# Patient Record
Sex: Female | Born: 1965 | Race: White | Hispanic: No | Marital: Single | Smoking: Former smoker
Health system: Southern US, Community
[De-identification: ages and names within clinical notes are randomized; demographics above are authoritative.]

## PROBLEM LIST (undated history)

## (undated) DIAGNOSIS — K259 Gastric ulcer, unspecified as acute or chronic, without hemorrhage or perforation: Secondary | ICD-10-CM

## (undated) DIAGNOSIS — I10 Essential (primary) hypertension: Secondary | ICD-10-CM

## (undated) DIAGNOSIS — J45909 Unspecified asthma, uncomplicated: Secondary | ICD-10-CM

## (undated) HISTORY — PX: CHOLECYSTECTOMY: SHX55

---

## 2007-08-05 ENCOUNTER — Other Ambulatory Visit: Payer: Self-pay

## 2007-08-05 ENCOUNTER — Emergency Department: Payer: Self-pay | Admitting: Emergency Medicine

## 2007-08-29 ENCOUNTER — Other Ambulatory Visit: Payer: Self-pay

## 2007-08-30 ENCOUNTER — Inpatient Hospital Stay: Payer: Self-pay | Admitting: Internal Medicine

## 2007-09-08 ENCOUNTER — Inpatient Hospital Stay: Payer: Self-pay | Admitting: *Deleted

## 2007-09-14 ENCOUNTER — Other Ambulatory Visit: Payer: Self-pay

## 2007-09-15 ENCOUNTER — Inpatient Hospital Stay: Payer: Self-pay | Admitting: Internal Medicine

## 2007-12-26 ENCOUNTER — Other Ambulatory Visit: Payer: Self-pay

## 2007-12-27 ENCOUNTER — Inpatient Hospital Stay: Payer: Self-pay | Admitting: Internal Medicine

## 2008-02-10 ENCOUNTER — Other Ambulatory Visit: Payer: Self-pay

## 2008-02-11 ENCOUNTER — Inpatient Hospital Stay: Payer: Self-pay | Admitting: Internal Medicine

## 2008-03-04 ENCOUNTER — Other Ambulatory Visit: Payer: Self-pay

## 2008-03-04 ENCOUNTER — Emergency Department: Payer: Self-pay | Admitting: Emergency Medicine

## 2008-03-05 ENCOUNTER — Inpatient Hospital Stay: Payer: Self-pay | Admitting: Internal Medicine

## 2008-05-23 ENCOUNTER — Ambulatory Visit: Payer: Self-pay | Admitting: Internal Medicine

## 2008-05-30 ENCOUNTER — Inpatient Hospital Stay: Payer: Self-pay | Admitting: Internal Medicine

## 2008-06-23 ENCOUNTER — Ambulatory Visit: Payer: Self-pay | Admitting: Internal Medicine

## 2008-07-05 ENCOUNTER — Emergency Department: Payer: Self-pay | Admitting: Emergency Medicine

## 2008-11-21 ENCOUNTER — Ambulatory Visit: Payer: Self-pay | Admitting: Internal Medicine

## 2008-12-09 ENCOUNTER — Inpatient Hospital Stay: Payer: Self-pay | Admitting: Specialist

## 2008-12-16 ENCOUNTER — Emergency Department: Payer: Self-pay | Admitting: Internal Medicine

## 2008-12-19 ENCOUNTER — Ambulatory Visit: Payer: Self-pay | Admitting: Internal Medicine

## 2008-12-21 ENCOUNTER — Ambulatory Visit: Payer: Self-pay | Admitting: Internal Medicine

## 2008-12-21 ENCOUNTER — Ambulatory Visit: Payer: Self-pay | Admitting: Gynecologic Oncology

## 2009-01-21 ENCOUNTER — Ambulatory Visit: Payer: Self-pay | Admitting: Internal Medicine

## 2009-01-21 ENCOUNTER — Ambulatory Visit: Payer: Self-pay | Admitting: Gynecologic Oncology

## 2009-02-21 ENCOUNTER — Ambulatory Visit: Payer: Self-pay | Admitting: Internal Medicine

## 2009-02-21 ENCOUNTER — Ambulatory Visit: Payer: Self-pay | Admitting: Gynecologic Oncology

## 2009-03-23 ENCOUNTER — Ambulatory Visit: Payer: Self-pay | Admitting: Gynecologic Oncology

## 2009-03-30 ENCOUNTER — Ambulatory Visit: Payer: Self-pay | Admitting: Internal Medicine

## 2009-04-23 ENCOUNTER — Ambulatory Visit: Payer: Self-pay | Admitting: Gynecologic Oncology

## 2009-04-23 ENCOUNTER — Ambulatory Visit: Payer: Self-pay | Admitting: Internal Medicine

## 2009-05-21 ENCOUNTER — Inpatient Hospital Stay: Payer: Self-pay | Admitting: *Deleted

## 2009-08-08 ENCOUNTER — Emergency Department: Payer: Self-pay

## 2009-09-15 ENCOUNTER — Emergency Department: Payer: Self-pay | Admitting: Emergency Medicine

## 2009-11-26 ENCOUNTER — Emergency Department: Payer: Self-pay | Admitting: Emergency Medicine

## 2010-01-15 ENCOUNTER — Inpatient Hospital Stay: Payer: Self-pay | Admitting: Internal Medicine

## 2010-02-07 ENCOUNTER — Emergency Department: Payer: Self-pay | Admitting: Emergency Medicine

## 2010-06-21 ENCOUNTER — Emergency Department: Payer: Self-pay | Admitting: Emergency Medicine

## 2010-09-09 ENCOUNTER — Emergency Department: Payer: Self-pay | Admitting: Emergency Medicine

## 2010-09-13 ENCOUNTER — Emergency Department: Payer: Self-pay | Admitting: Emergency Medicine

## 2010-12-18 ENCOUNTER — Emergency Department: Payer: Self-pay | Admitting: Emergency Medicine

## 2011-01-30 ENCOUNTER — Inpatient Hospital Stay: Payer: Self-pay | Admitting: Internal Medicine

## 2011-02-09 ENCOUNTER — Emergency Department: Payer: Self-pay | Admitting: *Deleted

## 2011-02-10 ENCOUNTER — Inpatient Hospital Stay: Payer: Self-pay | Admitting: *Deleted

## 2011-02-17 LAB — PATHOLOGY REPORT

## 2011-03-30 ENCOUNTER — Emergency Department: Payer: Self-pay | Admitting: Emergency Medicine

## 2012-02-23 ENCOUNTER — Emergency Department: Payer: Self-pay | Admitting: Internal Medicine

## 2012-02-23 LAB — COMPREHENSIVE METABOLIC PANEL
Albumin: 4.4 g/dL (ref 3.4–5.0)
Alkaline Phosphatase: 115 U/L (ref 50–136)
Anion Gap: 6 — ABNORMAL LOW (ref 7–16)
BUN: 14 mg/dL (ref 7–18)
Calcium, Total: 9.6 mg/dL (ref 8.5–10.1)
Co2: 28 mmol/L (ref 21–32)
EGFR (Non-African Amer.): >60
Glucose: 105 mg/dL — ABNORMAL HIGH (ref 65–99)
Osmolality: 284 (ref 275–301)
Potassium: 3.8 mmol/L (ref 3.5–5.1)
SGOT(AST): 19 U/L (ref 15–37)
SGPT (ALT): 20 U/L (ref 12–78)

## 2012-02-23 LAB — URINALYSIS, COMPLETE
Bilirubin,UR: NEGATIVE
Glucose,UR: NEGATIVE mg/dL (ref 0–75)
Ketone: NEGATIVE
Leukocyte Esterase: NEGATIVE
Nitrite: NEGATIVE
RBC,UR: 2 /HPF (ref 0–5)
Squamous Epithelial: 3
WBC UR: <1 /HPF (ref 0–5)

## 2012-02-23 LAB — CBC WITH DIFFERENTIAL/PLATELET
Basophil %: 0.7 %
Eosinophil #: 0.3 10*3/uL (ref 0.0–0.7)
Eosinophil %: 2.5 %
Lymphocyte %: 14.8 %
MCV: 87 fL (ref 80–100)
Monocyte #: 0.6 x10 3/mm (ref 0.2–0.9)
Neutrophil #: 10 10*3/uL — ABNORMAL HIGH (ref 1.4–6.5)
Neutrophil %: 77.3 %
RDW: 14.2 % (ref 11.5–14.5)
WBC: 12.9 10*3/uL — ABNORMAL HIGH (ref 3.6–11.0)

## 2012-02-23 LAB — CK TOTAL AND CKMB (NOT AT ARMC)
CK, Total: 80 U/L (ref 21–215)
CK-MB: 0.7 ng/mL (ref 0.5–3.6)

## 2012-02-23 LAB — TROPONIN I: Troponin-I: <0.02 ng/mL

## 2012-02-23 LAB — LIPASE, BLOOD: Lipase: 111 U/L (ref 73–393)

## 2012-02-24 ENCOUNTER — Inpatient Hospital Stay: Payer: Self-pay | Admitting: Internal Medicine

## 2012-02-24 LAB — HEPATIC FUNCTION PANEL A (ARMC)
Albumin: 4.3 g/dL (ref 3.4–5.0)
Alkaline Phosphatase: 114 U/L (ref 50–136)
Bilirubin,Total: 0.5 mg/dL (ref 0.2–1.0)
SGOT(AST): 18 U/L (ref 15–37)
SGPT (ALT): 20 U/L (ref 12–78)
Total Protein: 8.3 g/dL — ABNORMAL HIGH (ref 6.4–8.2)

## 2012-02-24 LAB — BASIC METABOLIC PANEL
Anion Gap: 9 (ref 7–16)
Calcium, Total: 9.7 mg/dL (ref 8.5–10.1)
Chloride: 105 mmol/L (ref 98–107)
Co2: 25 mmol/L (ref 21–32)
Creatinine: 0.68 mg/dL (ref 0.60–1.30)
EGFR (African American): >60
EGFR (Non-African Amer.): >60
Glucose: 119 mg/dL — ABNORMAL HIGH (ref 65–99)
Sodium: 139 mmol/L (ref 136–145)

## 2012-02-24 LAB — CBC WITH DIFFERENTIAL/PLATELET
Basophil #: 0.1 10*3/uL (ref 0.0–0.1)
Basophil %: 0.6 %
Eosinophil #: 0.1 10*3/uL (ref 0.0–0.7)
Eosinophil %: 0.7 %
Lymphocyte #: 2.2 10*3/uL (ref 1.0–3.6)
MCH: 29.7 pg (ref 26.0–34.0)
MCV: 87 fL (ref 80–100)
Monocyte #: 0.8 x10 3/mm (ref 0.2–0.9)
Platelet: 351 10*3/uL (ref 150–440)
RBC: 5.08 10*6/uL (ref 3.80–5.20)

## 2012-02-24 LAB — LIPASE, BLOOD: Lipase: 125 U/L (ref 73–393)

## 2012-02-25 LAB — CBC WITH DIFFERENTIAL/PLATELET
Basophil #: 0.2 10*3/uL — ABNORMAL HIGH (ref 0.0–0.1)
Eosinophil #: 0.2 10*3/uL (ref 0.0–0.7)
Eosinophil %: 1.6 %
HCT: 38.4 % (ref 35.0–47.0)
Lymphocyte #: 3.3 10*3/uL (ref 1.0–3.6)
Lymphocyte %: 28.1 %
MCHC: 33.4 g/dL (ref 32.0–36.0)
MCV: 87 fL (ref 80–100)
Monocyte #: 1.1 x10 3/mm — ABNORMAL HIGH (ref 0.2–0.9)
Neutrophil %: 59.7 %
Platelet: 296 10*3/uL (ref 150–440)
RBC: 4.4 10*6/uL (ref 3.80–5.20)
RDW: 14.2 % (ref 11.5–14.5)

## 2012-02-25 LAB — BASIC METABOLIC PANEL
Anion Gap: 7 (ref 7–16)
Calcium, Total: 8.7 mg/dL (ref 8.5–10.1)
EGFR (African American): >60
EGFR (Non-African Amer.): >60
Glucose: 90 mg/dL (ref 65–99)
Osmolality: 276 (ref 275–301)

## 2012-02-25 LAB — MAGNESIUM: Magnesium: 1.7 mg/dL — ABNORMAL LOW

## 2012-08-24 ENCOUNTER — Emergency Department: Payer: Self-pay | Admitting: Emergency Medicine

## 2012-08-24 LAB — URINALYSIS, COMPLETE
Glucose,UR: NEGATIVE mg/dL (ref 0–75)
Hyaline Cast: 78
Leukocyte Esterase: NEGATIVE
Nitrite: NEGATIVE
Ph: 5 (ref 4.5–8.0)
RBC,UR: 37 /HPF (ref 0–5)
Specific Gravity: 1.033 (ref 1.003–1.030)

## 2012-08-24 LAB — CBC
HCT: 45.6 % (ref 35.0–47.0)
HGB: 15.5 g/dL (ref 12.0–16.0)
MCH: 29.1 pg (ref 26.0–34.0)
MCHC: 33.9 g/dL (ref 32.0–36.0)
Platelet: 482 10*3/uL — ABNORMAL HIGH (ref 150–440)
RBC: 5.31 10*6/uL — ABNORMAL HIGH (ref 3.80–5.20)
RDW: 14 % (ref 11.5–14.5)

## 2012-08-24 LAB — COMPREHENSIVE METABOLIC PANEL
Albumin: 4.8 g/dL (ref 3.4–5.0)
Alkaline Phosphatase: 143 U/L — ABNORMAL HIGH (ref 50–136)
Anion Gap: 7 (ref 7–16)
BUN: 19 mg/dL — ABNORMAL HIGH (ref 7–18)
Chloride: 105 mmol/L (ref 98–107)
Co2: 29 mmol/L (ref 21–32)
Creatinine: 0.89 mg/dL (ref 0.60–1.30)
Osmolality: 287 (ref 275–301)
Potassium: 3.3 mmol/L — ABNORMAL LOW (ref 3.5–5.1)
Sodium: 141 mmol/L (ref 136–145)

## 2012-08-24 LAB — LIPASE, BLOOD: Lipase: 96 U/L (ref 73–393)

## 2012-08-25 ENCOUNTER — Observation Stay: Payer: Self-pay | Admitting: Family Medicine

## 2012-08-25 LAB — CBC
HCT: 43 % (ref 35.0–47.0)
MCH: 29.5 pg (ref 26.0–34.0)
MCHC: 34 g/dL (ref 32.0–36.0)
MCV: 87 fL (ref 80–100)
RBC: 4.95 10*6/uL (ref 3.80–5.20)
RDW: 14.2 % (ref 11.5–14.5)
WBC: 22 10*3/uL — ABNORMAL HIGH (ref 3.6–11.0)

## 2012-08-25 LAB — COMPREHENSIVE METABOLIC PANEL
Alkaline Phosphatase: 126 U/L (ref 50–136)
Anion Gap: 9 (ref 7–16)
BUN: 12 mg/dL (ref 7–18)
Chloride: 102 mmol/L (ref 98–107)
Co2: 27 mmol/L (ref 21–32)
Creatinine: 0.56 mg/dL — ABNORMAL LOW (ref 0.60–1.30)
EGFR (Non-African Amer.): >60
Glucose: 116 mg/dL — ABNORMAL HIGH (ref 65–99)
Potassium: 3 mmol/L — ABNORMAL LOW (ref 3.5–5.1)
SGOT(AST): 22 U/L (ref 15–37)
SGPT (ALT): 29 U/L (ref 12–78)
Sodium: 138 mmol/L (ref 136–145)
Total Protein: 8.6 g/dL — ABNORMAL HIGH (ref 6.4–8.2)

## 2012-08-25 LAB — URINALYSIS, COMPLETE
Bilirubin,UR: NEGATIVE
Ketone: NEGATIVE
Leukocyte Esterase: NEGATIVE
Nitrite: NEGATIVE
Ph: 8 (ref 4.5–8.0)
Protein: 30
RBC,UR: 5 /HPF (ref 0–5)
Specific Gravity: 1.015 (ref 1.003–1.030)
WBC UR: 2 /HPF (ref 0–5)

## 2012-08-25 LAB — TROPONIN I: Troponin-I: <0.02 ng/mL

## 2012-08-25 LAB — CLOSTRIDIUM DIFFICILE BY PCR

## 2012-08-26 LAB — BASIC METABOLIC PANEL
Anion Gap: 4 — ABNORMAL LOW (ref 7–16)
Calcium, Total: 8.8 mg/dL (ref 8.5–10.1)
Chloride: 110 mmol/L — ABNORMAL HIGH (ref 98–107)
Creatinine: 0.72 mg/dL (ref 0.60–1.30)
EGFR (African American): >60
Osmolality: 278 (ref 275–301)
Potassium: 3.8 mmol/L (ref 3.5–5.1)

## 2012-08-26 LAB — CBC WITH DIFFERENTIAL/PLATELET
Basophil %: 0.9 %
Eosinophil #: 0.3 10*3/uL (ref 0.0–0.7)
HCT: 39.2 % (ref 35.0–47.0)
HGB: 13.1 g/dL (ref 12.0–16.0)
MCH: 29.2 pg (ref 26.0–34.0)
MCHC: 33.5 g/dL (ref 32.0–36.0)
MCV: 87 fL (ref 80–100)
Monocyte #: 1.1 x10 3/mm — ABNORMAL HIGH (ref 0.2–0.9)
Monocyte %: 10.3 %
Neutrophil %: 47.4 %
Platelet: 313 10*3/uL (ref 150–440)
RBC: 4.5 10*6/uL (ref 3.80–5.20)
RDW: 14.2 % (ref 11.5–14.5)
WBC: 10.3 10*3/uL (ref 3.6–11.0)

## 2012-08-27 LAB — BASIC METABOLIC PANEL
Anion Gap: 4 — ABNORMAL LOW (ref 7–16)
BUN: 5 mg/dL — ABNORMAL LOW (ref 7–18)
Co2: 30 mmol/L (ref 21–32)
EGFR (African American): >60
EGFR (Non-African Amer.): >60
Potassium: 3.5 mmol/L (ref 3.5–5.1)

## 2013-04-07 ENCOUNTER — Emergency Department: Payer: Self-pay | Admitting: Emergency Medicine

## 2013-04-07 LAB — URINALYSIS, COMPLETE
Glucose,UR: NEGATIVE mg/dL (ref 0–75)
Hyaline Cast: 3
Ketone: NEGATIVE
Leukocyte Esterase: NEGATIVE
Nitrite: NEGATIVE
Protein: 100
Specific Gravity: 1.031 (ref 1.003–1.030)
WBC UR: 2 /HPF (ref 0–5)

## 2013-04-07 LAB — CBC
MCH: 29.8 pg (ref 26.0–34.0)
MCHC: 35 g/dL (ref 32.0–36.0)
Platelet: 458 10*3/uL — ABNORMAL HIGH (ref 150–440)
RBC: 5.76 10*6/uL — ABNORMAL HIGH (ref 3.80–5.20)
RDW: 14.2 % (ref 11.5–14.5)
WBC: 25.2 10*3/uL — ABNORMAL HIGH (ref 3.6–11.0)

## 2013-04-07 LAB — COMPREHENSIVE METABOLIC PANEL
Albumin: 4.6 g/dL (ref 3.4–5.0)
BUN: 21 mg/dL — ABNORMAL HIGH (ref 7–18)
Chloride: 104 mmol/L (ref 98–107)
Co2: 27 mmol/L (ref 21–32)
EGFR (African American): >60
Glucose: 146 mg/dL — ABNORMAL HIGH (ref 65–99)
Potassium: 4.4 mmol/L (ref 3.5–5.1)
Sodium: 135 mmol/L — ABNORMAL LOW (ref 136–145)

## 2013-04-07 LAB — LIPASE, BLOOD: Lipase: 157 U/L (ref 73–393)

## 2013-04-07 LAB — GC/CHLAMYDIA PROBE AMP

## 2013-10-22 ENCOUNTER — Observation Stay: Payer: Self-pay | Admitting: Internal Medicine

## 2013-10-22 LAB — BASIC METABOLIC PANEL
Anion Gap: 6 — ABNORMAL LOW (ref 7–16)
BUN: 19 mg/dL — ABNORMAL HIGH (ref 7–18)
Calcium, Total: 9.4 mg/dL (ref 8.5–10.1)
Chloride: 106 mmol/L (ref 98–107)
Co2: 24 mmol/L (ref 21–32)
Creatinine: 0.58 mg/dL — ABNORMAL LOW (ref 0.60–1.30)
EGFR (African American): >60
EGFR (Non-African Amer.): >60
Glucose: 101 mg/dL — ABNORMAL HIGH (ref 65–99)
Osmolality: 274 (ref 275–301)
POTASSIUM: 3.6 mmol/L (ref 3.5–5.1)
Sodium: 136 mmol/L (ref 136–145)

## 2013-10-22 LAB — CBC
HCT: 42.8 % (ref 35.0–47.0)
HGB: 14.9 g/dL (ref 12.0–16.0)
MCH: 29.7 pg (ref 26.0–34.0)
MCHC: 34.7 g/dL (ref 32.0–36.0)
MCV: 86 fL (ref 80–100)
PLATELETS: 353 10*3/uL (ref 150–440)
RBC: 5.01 10*6/uL (ref 3.80–5.20)
RDW: 14.6 % — AB (ref 11.5–14.5)
WBC: 13.9 10*3/uL — ABNORMAL HIGH (ref 3.6–11.0)

## 2013-10-22 LAB — URINALYSIS, COMPLETE
BILIRUBIN, UR: NEGATIVE
Bacteria: NONE SEEN
GLUCOSE, UR: NEGATIVE mg/dL (ref 0–75)
Ketone: NEGATIVE
Leukocyte Esterase: NEGATIVE
Nitrite: NEGATIVE
PROTEIN: NEGATIVE
Ph: 7 (ref 4.5–8.0)
RBC,UR: 8 /HPF (ref 0–5)
SPECIFIC GRAVITY: 1.013 (ref 1.003–1.030)
Squamous Epithelial: 1
WBC UR: <1 /HPF (ref 0–5)

## 2013-10-22 LAB — TROPONIN I

## 2013-10-22 LAB — D-DIMER(ARMC): D-Dimer: 395 ng/ml

## 2013-10-22 LAB — HEPATIC FUNCTION PANEL A (ARMC)
ALBUMIN: 4.2 g/dL (ref 3.4–5.0)
ALK PHOS: 105 U/L
AST: 25 U/L (ref 15–37)
BILIRUBIN DIRECT: 0.1 mg/dL (ref 0.00–0.20)
Bilirubin,Total: 0.8 mg/dL (ref 0.2–1.0)
SGPT (ALT): 34 U/L (ref 12–78)
TOTAL PROTEIN: 8.4 g/dL — AB (ref 6.4–8.2)

## 2013-10-22 LAB — LIPASE, BLOOD: LIPASE: 97 U/L (ref 73–393)

## 2013-10-22 LAB — HCG, QUANTITATIVE, PREGNANCY: Beta Hcg, Quant.: 1 m[IU]/mL

## 2013-10-24 LAB — WBC: WBC: 8 10*3/uL (ref 3.6–11.0)

## 2014-10-10 NOTE — Consult Note (Signed)
PATIENT NAME:  Lynn Larson, Lynn Larson MR#:  [id number] DATE OF BIRTH:  [birth year]  DATE OF CONSULTATION:  02/25/2012  REFERRING PHYSICIAN:  Delfino Lovett, MD  CONSULTING PHYSICIAN:  Lurline Del, MD  REFERRING PHYSICIAN: Nausea, vomiting, questionable hematemesis, and diarrhea.   HISTORY OF PRESENT ILLNESS: The patient is a 49 year old female. The patient has history of chronic gastritis and gastric erosions. She also has a history of nonsteroidal-induced peptic ulcer disease and COPD. The patient's last EGD was in 2012 with erosive gastritis and a short segment Barrett's esophagus was noted. The patient was admitted as according to her she has been having some pain high in the epigastric area. It started about a couple of days ago that was associated with some nausea, vomiting, and apparently she threw up a small amount of blood as well. According to the patient, she has been having diarrhea for the last two days as well but had a normal bowel movement today. She was supposed to be on PPI as outpatient but has been using mainly TUMS and Mylanta as outpatient.   PAST MEDICAL HISTORY:  1. History of erosive gastritis as seen on an upper GI endoscopy last year.  2. History of NSAID use in the past. 3. Chronic obstructive pulmonary disease.  4. 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 5. C. difficile colitis. 6. Hypertension.   SOCIAL HISTORY: She smokes about half a pack a day.   ALLERGIES: Morphine and Chantix.   MEDICATIONS AT HOME:  1. Zofran. 2. TUMS. 3. Mylanta.   REVIEW OF SYSTEMS: Grossly negative except for what is mentioned in the history of present illness.   PHYSICAL EXAMINATION:   GENERAL: Well built female, obese, does not appear to be in any acute distress, fully awake, alert, and oriented.   HEENT: Unremarkable.   NECK: Neck veins are flat.   LUNGS: Grossly clear to auscultation bilaterally with fair air entry and no added sounds.   CARDIOVASCULAR: Regular rate and  rhythm. No gallops or murmur.   ABDOMEN: Quite soft. Bowel sounds are positive. Nondistended. Mild epigastric tenderness was noted. There is no rebound or guarding.   NEUROLOGIC: Appears to be unremarkable.   VITAL SIGNS: Temperature 97.2, heart rate 66, blood pressure 113/71.   LABORATORY, DIAGNOSTIC, AND RADIOLOGICAL DATA: White cell count was 12.9 on admission, is now 11.6, hemoglobin 15.9, hematocrit 47.2 on admission. Current hemoglobin is 12.8 with a hematocrit of 38.4. Serum lipase is normal. Electrolytes are normal. Liver enzymes are unremarkable. Urinalysis is unremarkable.   CT scan of abdomen and pelvis showed minimal thickening of the wall of the stomach, colon, and small intestine which is probably secondary to poor distention as reported by the radiologist.   Three-way abdominal films are unremarkable.   ASSESSMENT AND PLAN: This is a patient with nausea, vomiting, questionable hematemesis and epigastric pain most likely secondary to persistent and recurrent erosive gastritis. She did have some diarrhea and, therefore, an acute gastroenteritis is another possibility. Her diarrhea has resolved. An upper GI endoscopy was performed a few minutes ago which showed severe erosive gastritis. I would recommend continuing her on PPI here as well as outpatient and this has been discussed with the patient that TUMS and Mylanta would not be enough to control her symptoms. The patient should follow-up with [facility] in about four weeks. Avoid non-steroidals. Full liquid diet and advance as tolerated.   If patient tolerates diet without any significant problem, anticipate discharge in the next 24 hours or so. Plan was  discussed with Dr. Delfino LovettVipul Shah.   ____________________________ Lurline DelShaukat Rishaan Gunner, MD si:drc D: 02/25/2012 12:06:36 ET T: 02/25/2012 12:46:58 ET JOB#: [id number]  cc: Lurline DelShaukat Krisa Blattner, MD, <Dictator> Lurline DelSHAUKAT Whittney Steenson MD ELECTRONICALLY SIGNED 03/03/2012 11:02

## 2014-10-10 NOTE — H&P (Signed)
PATIENT NAMAnder Larson:  Lynn Larson, Lynn Larson MR#:  [id number] DATE OF BIRTH:  24-Feb-1966  DATE OF ADMISSION:  02/24/2012  CHIEF COMPLAINT:  Abdominal pain, nausea, vomiting, and diarrhea for three days.   HISTORY OF PRESENT ILLNESS: 49 year old Caucasian female with a history of severe erosive gastritis and esophagitis, NSAID-induced peptic ulcer disease, and chronic obstructive pulmonary disease who presented to the ED with the above chief complaint. The patient is alert, awake, oriented. According to her, she developed abdominal pain with nausea, vomiting, and diarrhea for the past three days. The abdominal pain is in the epigastric area, constant, 6-10/10, stabbing, no radiation to the back, associated with nausea and vomiting. The patient said she has bloody vomitus. In addition, the patient has watery diarrhea 5 times, but denies any melena or bloody stool. The patient denies any NSAID use recently.   PAST MEDICAL HISTORY:  1. Severe erosive gastritis and esophagitis and possible duodenitis. 2. NSAID-induced peptic ulcer disease. 3. Chronic obstructive pulmonary disease. 4. Tobacco use.  5. Colitis. 6. Sphincter of Oddi dysfunction.  7. History of C. Difficile colitis in July 2011.  8. Hypertension.  SOCIAL HISTORY: The patient continues to smoke half pack a day. No alcohol. Denies any drug abuse.   FAMILY HISTORY: No CVA. No hypertension, diabetes, or coronary artery disease.   ALLERGIES:  Morphine, Chantix.  MEDICATIONS: Zofran 4 mg p.o. p.r.n.   REVIEW OF SYSTEMS: CONSTITUTIONAL: The patient denies any fever or chills. No headache or dizziness. Has weakness. EYES: No double vision or blurred vision. ENT: No postnasal drip, epistaxis, slurred speech, or dysphagia. CARDIOVASCULAR: No chest pain, palpitations, orthopnea, or nocturnal dyspnea. No leg edema. PULMONARY: No cough, sputum, shortness of breath, or hemoptysis. GI: Positive for abdominal pain, nausea, vomiting, and diarrhea. No melena or bloody  stool but has bloody vomitus. GU: No dysuria, hematuria, or incontinence. SKIN: No rash or jaundice. HEMATOLOGY: No easy bruising or bleeding. ENDOCRINE: No polyuria, polydipsia, or heat or cold intolerance. NEUROLOGIC: No syncope, loss of consciousness, or seizure. PSYCHIATRIC:  No depression, no anxiety, but has lot of stress from work.   PHYSICAL EXAMINATION:  VITALS: Blood pressure 160/67, pulse 66,  respirations 20, oxygen saturation 97% on room air.   GENERAL: The patient is alert, awake, and oriented. She is vomiting, in no acute distress.   HEENT: Pupils round, equal, reactive to light and accommodation. Moist oral mucosa. Clear oropharynx.   NECK: Supple. No JVD or carotid bruits. No lymphadenopathy. No thyromegaly. Moist oral mucosa. Clear oropharynx.   CARDIOVASCULAR: S1, S2, regular rate and rhythm. No murmurs or gallops.   PULMONARY: Bilateral air entry. No wheezing or rales. No use of accessory muscles to breathe.   ABDOMEN: Soft. Tenderness in the epigastric area. No rigidity. No rebound. Bowel sounds present. No organomegaly.   EXTREMITIES: No edema, clubbing, or cyanosis. No calf tenderness. Strong bilateral pedal pulses.   SKIN: No rash or jaundice.   NEUROLOGY: Alert and oriented times three. No focal deficits. Deep tendon reflexes 2+. Power five out of five. Sensation intact.   LABORATORY, DIAGNOSTIC, AND RADIOLOGICAL DATA: WBC 18.8, yesterday it was 12. Hemoglobin 15.1, platelets 351, glucose 119, BUN 15, creatinine 0.68. Electrolytes are normal. Lipase 125, bilirubin 0.5 total, direct 0.1. CT scan of the pelvis and abdomen with contrast yesterday showed mild wall thickening noted of the stomach, small bowel loops, and colon.  A few inflammatory changes cannot completely be excluded. Abdominal three-way x-ray showed nonobstructive bowel gas pattern. Chest radiograph without evidence of acute  cardiopulmonary disease. Urinalysis is negative.   IMPRESSION:  1. Possible  acute gastroenteritis.  2. GI bleeding. Need to rule out peptic ulcer disease.  3. Leukocytosis, possibly due to reaction.  4. Chronic obstructive pulmonary disease, stable.  5. Tobacco abuse.   PLAN OF TREATMENT:  1. The patient will be admitted to medical floor. We will keep n.p.o. except medications. We will start IV fluid support and get a GI consult.  2. We will start Zofran and Reglan IV p.r.n. Give Dilaudid p.r.n. and follow up CBC.  3. For uncontrolled hypertension we will start Norvasc.  4. Smoking cessation was counseled for five minutes.   Discussed the patient's situation and plan of treatment with the patient.   TIME SPENT: About 55 minutes.    ____________________________ Shaune Pollack, MD qc:bjt D: 02/24/2012 16:05:37 ET T: 02/24/2012 16:31:28 ET JOB#: [id number]  cc: Shaune Pollack, MD, <Dictator> Shaune Pollack MD ELECTRONICALLY SIGNED 02/25/2012 15:54

## 2014-10-10 NOTE — Consult Note (Signed)
Chief Complaint:   Subjective/Chief Complaint EGD showed erosive gastritis. Biopsies taken for H. pylori. Small hiatal hernia.  Continue PPI as OP. Full liquid diet, advance as tolerated. [facility].   Electronic Signatures: Lurline DelIftikhar, Angely Dietz (MD)  (Signed 04-Sep-13 11:54)  Authored: Chief Complaint   Last Updated: 04-Sep-13 11:54 by Lurline DelIftikhar, Yasin Ducat (MD)

## 2014-10-10 NOTE — Discharge Summary (Signed)
PATIENT NAMAnder Larson:  Larson, Lynn MR#:  [id number] DATE OF BIRTH:  1966-02-17  DATE OF ADMISSION:  02/24/2012 DATE OF DISCHARGE:  02/25/2012  DISCHARGE DIAGNOSES:  1. Possible acute gastroenteritis with nausea, vomiting, and abdominal pain, now improved with EGD showing erosive gastritis and small hiatal hernia. 2. Leukocytosis, possibly due to reaction. 3. Tobacco abuse. Counseled for about three minutes.   SECONDARY DIAGNOSES:  1. Severe erosive gastritis and esophagitis with possible duodenitis.  2. NSAID induced peptic ulcer disease.  3. Chronic obstructive pulmonary disease. 4. Colitis.  5. History of Clostridium difficile.  6. Hypertension.   CONSULTATION: GI, Dr. Niel HummerIftikhar   PROCEDURES/RADIOLOGY:  1. EGD by Dr. Niel HummerIftikhar on September 4th showed erosive gastritis and small hiatal hernia.  2. CT scan of the abdomen and pelvis with contrast on September 2nd showed mild wall thickening of the stomach, small bowel loops and colon.  3. Abdominal three-way with PA of chest on September 2nd showed nonobstructive bowel gas pattern. No acute cardiopulmonary disease.  4. Urinalysis on admission was negative.   HISTORY AND SHORT HOSPITAL COURSE: The patient is a 49 year old female with the above-mentioned medical problems who was admitted for possible gastroenteritis. She was having severe abdominal pain, nausea, and vomiting. She underwent CT scan of the abdomen and x-ray with results dictated above. GI consultation was obtained with Dr. Niel HummerIftikhar who recommended EGD which was performed with results dictated above essentially showing gastritis. He recommended starting on full liquid diet and advance as tolerated. The patient tolerated the diet well. She really wanted to go home so was discharged on September 4th in stable condition.   On the date of discharge her vital signs were as follows: Temperature 97.9, heart rate 84 per minute, respirations 20 per minute, blood pressure 113/71 mmHg. She was  saturating 95% on room air.   PERTINENT PHYSICAL EXAMINATION ON THE DATE OF DISCHARGE: CARDIOVASCULAR: S1, S2 normal. No murmurs, rubs, or gallop. LUNGS: Clear to auscultation bilaterally. No wheezing, rales, rhonchi, or crepitation. ABDOMEN: Soft, benign. NEUROLOGIC: Nonfocal examination. All other physical examination remained at baseline.   DISCHARGE MEDICATIONS:  1. Zofran 4 mg p.o. every eight hours as needed.  2. Ranitidine 300 mg p.o. b.i.d.   DISCHARGE DIET: Low sodium.   DISCHARGE ACTIVITY: As tolerated.   DISCHARGE INSTRUCTIONS AND FOLLOW-UP:  1. The patient was instructed to follow-up with her primary care physician at [facility] in 1 to 2 weeks.  2. She will need follow-up with Dr. Niel HummerIftikhar from GI in 2 to 4 weeks.   TOTAL TIME DISCHARGING THIS PATIENT: 55 minutes.   ____________________________ Ellamae SiaVipul S. Sherryll BurgerShah, MD vss:drc D: 02/25/2012 22:24:14 ET T: 02/27/2012 11:23:45 ET JOB#: [id number]  cc: Samayra Hebel S. Sherryll BurgerShah, MD, <Dictator> [facility] Lurline DelShaukat Iftikhar, MD Ellamae SiaVIPUL S [facility] MD ELECTRONICALLY SIGNED 02/27/2012 23:23

## 2014-10-10 NOTE — Consult Note (Signed)
Brief Consult Note: Diagnosis: Abdominal pain, vomiting and ? hematemesis.   Patient was seen by consultant.   Comments: ? PUD vs gastroentritis.  Recommendations: EGD today. Discussed with her and she is in full agreement. Further recommendations to follow.  Electronic Signatures: Lurline DelIftikhar, Jaymason Ledesma (MD)  (Signed 04-Sep-13 10:28)  Authored: Brief Consult Note   Last Updated: 04-Sep-13 10:28 by Lurline DelIftikhar, Tae Vonada (MD)

## 2014-10-13 NOTE — Consult Note (Signed)
PATIENT NAME:  Lynn Larson, Lynn Larson MR#:  [id number] DATE OF BIRTH:  1966/04/22  Dictated for Verdie Shire, MD  Martin:  08/26/2012  REFERRING PHYSICIAN:  Dr. Waldron Labs.  CONSULTING PHYSICIAN:  Corky Sox. Zettie Pho, PA-C  HOSPITALIST:  Dr. Laurin Coder.  REASON FOR CONSULTATION:  Nausea and vomiting, with a history of gastritis.   ATTENDING GASTROENTEROLOGY PHYSICIAN:  Dr. Verdie Shire.  HISTORY OF PRESENT ILLNESS: Lynn Larson is a pleasant 49 year old female seen in consultation at the request of Dr. Waldron Labs for evaluation of nausea, vomiting, and a history of gastritis. She initially presented to the Emergency Department with concerns of ongoing nausea and vomiting that seemed to be intractable, with accompanying epigastric abdominal pain. This had been going on for 2 to 3 days, and she did try to go home and avoid being admitted, however she stayed but she was unable to do so. She was therefore admitted for further evaluation and workup.  She does have a past medical history significant for erosive gastritis and esophagitis, seen on previous EGDs, most recently in September 2013. She denies NSAID use outside of Tylenol as needed, but she does admit that she does not stand PPI therapy at home as it is too expensive for her. She states she just takes Mylanta when she needs it. Currently, speaking with the patient she is tearful and states that her nausea and vomiting have improved since admission. She has not vomited since this morning; however, her epigastric abdominal pain has gotten quite severe. There is no significant dysphagia, necessarily. She does not feel the sensation of food getting stuck, but she does feel discomfort. She states her bowel movements have been slightly on the looser side, but she denies any bright-red blood per rectum or melena. She has noted subjective fevers; however, her temperature since being admitted has been within a normal range. She was  started on a PPI drip as well. She was incidentally found to have a UTI as well as a significant leukocytosis, with a current value of 22. There is currently no fever or chills. No chest pain or shortness of breath. Her biggest complaint currently is severe epigastric abdominal pain.  ALLERGIES: Morphine and Chantix.  HOME MEDICATIONS: Albuterol 2 puffs daily, promethazine 25 mg every 4 hours as needed for nausea.  PAST MEDICAL HISTORY: Significant for a history of esophagitis and gastritis, most recent EGD being September 2013, history of peptic ulcer disease, COPD, tobacco use, currently trying to quit and is on E-Cigarettes, sphincter of Oddi dysfunction, C. diff. colitis in 2011, hypertension.  SOCIAL HISTORY: The patient does state that she has a several-year history of tobacco use; however, she is currently quitting and is only using E-Cigarettes. She denies any alcohol or illicit drug use. She denies NSAID use. She takes Tylenol p.r.n. headaches.  FAMILY HISTORY: There is no known family history of GI malignancy or IBD.  REVIEW OF SYSTEMS: A 12-systems review of systems was obtained on the patient. All pertinent positives were mentioned above, and are otherwise negative.  OBJECTIVE: VITAL SIGNS: Blood pressure 148/87, heart rate 67, respirations 20, temperature 98 degrees, bedside pulse ox 93% on room air. GENERAL: This is a pleasant 49 year old female who does appear to be in obvious distress and is currently crying in the hospital room due to severe epigastric abdominal pain. Alert and oriented x 3.  HEAD: Atraumatic, normocephalic. NECK: Supple. No lymphadenopathy noted. EENT: Sclerae anicteric. Mucous membranes moist. The patient is crying. PULMONARY: Respirations are even  and nonlabored. Clear to auscultation bilaterally, anterior lung fields. CARDIAC: Regular rate and rhythm, S1, S2 noted. ABDOMEN: Is soft, nondistended. Positive tenderness to palpation is noted in the bilateral  upper quadrants without guarding or rebound. No signs of acute abdomen. Normoactive bowel sounds noted in all 4 quadrants. No organomegaly is palpated. No hernias appreciated. RECTAL EXAM: Deferred. EXTREMITIES: Negative for lower extremity edema; 2+ pulses noted, bilateral upper extremities. NEUROLOGIC: The patient is in a depressed mood; is quite tearful and uncomfortable. Alert and oriented x 3. Cranial nerves II-XII are grossly intact.  LABORATORY DATA:  White blood cells 22, hemoglobin 14.6, hematocrit 43, platelets 435. Sodium 138, potassium 3, BUN 12, creatinine 0.56, glucose 116, troponin is negative, lipase 91, bilirubin 0.6, alk phos 126, ALT 29, AST 22.  IMAGING: A CT of the abdomen and pelvis with contrast is currently pending.  ASSESSMENT AND PLAN:   ASSESSMENT:  1.  Severe epigastric abdominal pain. 2.  Nausea and vomiting, slowly starting to improve. 3.  History of esophagitis and gastritis. 4.  Leukocytosis: Unclear etiology. 5.  Urinary tract infection. 6.  Mild diarrhea, with a history of Clostridium difficile in 2011.  PLAN: I have discussed this patient's case in detail with Dr. Verdie Shire who is involved in the development of this patient's plan of care. At this time, because of the patient's history of gastritis and esophagitis with recent nausea and vomiting and current epigastric abdominal pain, we do agree with the patient being placed on a Protonix drip for GI prophylaxis. We do recommend continuing hydromorphone and Zofran as needed for symptomatic relief. Due to the severity of her symptoms and the patient currently being tearful due to the pain, we do agree with obtaining a CT of the abdomen and pelvis to evaluate. This is currently pending and is planned to be done later on tonight, 08/25/2012. We will await the findings of this scan before deciding to proceed with endoscopic intervention.   Also of note, the patient does have profound leukocytosis going up from 16 to  22, and should this persist or change in any way we would recommend considering blood cultures x2. Also, because the patient has a history of C. diff. and has had some looser stools recently, we would like to check a stool sample for a  C. diff. as well. We will continue to monitor this patient prior to hospitalization and make further recommendations pending above and per clinical course.  Thank you so much for this consultation and allowing [place] to participate in this patient's plan of care.       ____________________________ Corky Sox. Earle, PA-C kme:dm D: 08/26/2012 14:45:00 ET T: 08/26/2012 15:03:31 ET JOB#: [id number]  cc: Corky Sox. Earle, PA-C, <Dictator> [facility] PA ELECTRONICALLY SIGNED 08/30/2012 12:06

## 2014-10-13 NOTE — Discharge Summary (Signed)
PATIENT NAMAnder Gaster:  Larson, Lynn MR#:  [id number] DATE OF BIRTH:  11/18/1965  DATE OF ADMISSION:  08/25/2012 DATE OF DISCHARGE: 08/27/2012   Admission for observation.     DISCHARGE DISPOSITION:  Home.   MEDICATIONS AT DISCHARGE:  OxyContin 10/325 as needed pain, Protonix 40 mg twice daily, Phenergan as needed nausea and vomiting.   HOSPITAL COURSE:  The patient is a very nice 49 year old female, has history of acute onset abdominal pain with nausea and vomiting.  She had significant history of severe ulcerative gastritis and esophagitis in the past.  She also has COPD.  She has sphincter of Oddi dysfunction and a history of Clostridium difficile in the past.  She presents to the ER with abdominal pain, nausea, vomiting, having it for 2 to 3 days.  She was not able to keep up anything down and her white count was 22,000.  She was admitted for control of her symptoms.  GI was consulted.  GI did not have any different to treatments to add as far as procedures.  They evaluated the patient and decided that she needs to stay on her PPI as the patient was not taking it anymore.  The patient started improving very slowly.  Her pain was significant for what a CT scan of the abdomen was done during this hospitalization.  The CT scan of the abdomen did not show any acute abnormalities.  There was never any concerns about bleeding or any NSAID use.  No coffee-ground emesis for what the patient was not scoped.  She had hypokalemia and the potassium was repleted and it stayed up during this hospitalization.  She is able to be discharged in good condition.    LABORATORY DATA:  Important test results:  The patient had a UA, did have 16 white blood counts.  Repeated UA was only 2.  No symptoms of urine infection.  EKG, normal sinus rhythm.  Clostridium difficile was negative.  Urine culture was not showing any significant findings.  Her white count was 16,000 and 22,000, likely due to stress.  Her white count at discharge is  10.  Her electrolytes showed low potassium at 3.3 and then 3.  At discharge it is 3.5.  The patient is eating now for what she will be able to keep up with that.  Her calcium was 10.2 at March 4th, but now is 8.6.  That was likely due to dehydration.  Her LFTs were normal.   TIME SPENT:  I spent about 40 minutes with this discharge.      ____________________________ Lynn Furnaceoberto Sanchez Gutierrez, MD rsg:ea D: 08/27/2012 13:12:57 ET T: 08/27/2012 13:44:11 ET JOB#: [id number]  cc: Lynn Furnaceoberto Sanchez Gutierrez, MD, <Dictator> Dr. Bluford Kaufmannh Outside Physician  Lynn Vanderheyden Juanda ChanceSANCHEZ GUTIERRE MD ELECTRONICALLY SIGNED 09/07/2012 13:05

## 2014-10-13 NOTE — Consult Note (Signed)
Pt seen and examined. Please see Wilhelmenia BlaseKaryn Earle's notes. Pt with recurrent abd pain and N/V. Known hx of gastritis/duodenitis. Do not take PPI regularly due to excessive cost. Concerning that pain and WBC out of proportion for someone with just gastritis. Order stool for c.diff. Agree with PPI. Agree with CT of abd/pelvis. If CT fails to show obvious source or CT shows abnormal stomach, then will schedule EGD later. Will follow. Thanks.  Electronic Signatures: Lutricia Feilh, Paul (MD)  (Signed on 05-Mar-14 16:18)  Authored  Last Updated: 05-Mar-14 16:18 by Lutricia Feilh, Paul (MD)

## 2014-10-13 NOTE — Consult Note (Signed)
PATIENT NAME:  Lynn Larson, Lynn Larson MR#:  [id number] DATE OF BIRTH:  [birth year]  DATE OF CONSULTATION:  08/25/2012  REFERRING PHYSICIAN:  Dr. Randol KernElgergawy CONSULTING PHYSICIAN:  Hardie ShackletonKaryn M. Colin BentonEarle, PA-C  HOSPITALIST:  Dr. Mordecai MaesSanchez.    ATTENDING PHYSICIAN:  Dr. Lutricia FeilPaul Oh, gastroenterology  REASON FOR CONSULTATION:  Nausea and vomiting with a history of gastritis.    HISTORY OF PRESENT ILLNESS:  The patient is a pleasant 49 year old female seen in consultation at the request of Dr. Randol KernElgergawy for evaluation of nausea and vomiting with a history of gastritis.  She has been seen multiple times by our group in the past and most recently has undergone an endoscopy in September 2013, notable for gastritis.  She does admit that she does not take a PPI at home, as they are too expensive, but she does take Mylanta p.r.n.  She states that starting 24 to 48 hours ago, she began developing significant nausea and vomiting with associated epigastric abdominal pain and heartburn.  She went to the ER and tried to go home; however, this was intractable and she ended up coming back and getting admitted.  Upon further workup, laboratory data was noted to be having an abnormal white blood cell count, increasing from 16,000 to 22,000. She was also hypokalemic with a potassium level of 3.0.  Of note, her LFTs and lipase level were unremarkable.  Troponins were negative.  She was also found to have a UTI.  She describes this epigastric abdominal pain as being a tight pressure feeling, as though there is a knot there and there is also burning that goes up through the chest and into the back of her mouth.  She states she is still feeling quite nauseated, but the vomiting has improved since being started on a Protonix drip.  She cannot say for sure if there has been hematemesis as she has been eating red-colored popsicles.  She denies any changes in bowel habits outside of recent looser stools.  She does have a history of C. diff and a C. diff stool  culture is currently pending.  There is a history of subjective fevers; however, her temperature during this hospitalization thus far has been within normal limits.  No chest pain or shortness of breath.  No unintentional weight changes.    ALLERGIES:  MORPHINE AND CHANTIX.  PAST MEDICAL HISTORY:   1.  History of erosive gastritis and esophagitis, most recent EGD in September 2013.   2.  NSAID-induced peptic ulcer disease.  3.  COPD.  4.  History of tobacco use, currently has quit tobacco and is using e-cigarettes to wean herself off. 5.  History of C. diff colitis.   6.  Sphincter of Oddi dysfunction.   7.  Hypertension.   HOME MEDICATIONS: Albuterol inhaler 2 puffs daily and promethazine 25 mg 1 to 2 tablets q. 6 hours p.r.n.   SOCIAL HISTORY:  The patient has a history of tobacco use, but did quit recently and is now using e-cigarettes.  She denies any current tobacco, alcohol or illicit drug use.  She does take Tylenol p.r.n. and does try her best to stay away from ibuprofen or aspirin.    FAMILY HISTORY:  There is no known family history of GI malignancy or IBD.    REVIEW OF SYSTEMS:  A 12-system review of systems was obtained on the patient.  All pertinent positives are mentioned above and otherwise negative.    PHYSICAL EXAMINATION: VITAL SIGNS:  Blood pressure 148/87, heart rate 67,  respirations 20, temp 98 degrees, bedside pulse ox is 93% on room air.   GENERAL:  This is a pleasant 49 year old female who does appear to be in mild distress, is currently tearful, alert and oriented x 3.   HEAD:  Atraumatic and normocephalic.  NECK: Supple. No lymphadenopathy noted.   EENT:  Sclerae anicteric, mucous membranes moist. The patient is crying.   PULMONARY: Respirations are even and unlabored.  Good auscultation and bilateral entry of lungs fields.   CARDIAC: Regular rate and rhythm. S1, S2 noted.   ABDOMEN:  Soft, nondistended.  Mild tenderness to palpation is noted in the epigastric  region without guarding or rebound.  No signs of an acute abdomen.  No masses palpated.  No hernias appreciated.  Normoactive bowel sounds are noted in all 4 quadrants.   RECTAL:  Deferred.  EXTREMITIES:  Negative for lower extremity edema.  2+ pulses noted bilaterally.   NEUROLOGIC:  The patient is tearful, alert and oriented x 3.    LABORATORY DATA: White blood cells 22, hemoglobin 14.6, hematocrit 43, platelets 435, sodium 138, potassium 3, BUN 12, creatinine 0.56, glucose 116, bilirubin 0.6, alkaline phosphatase 126, ALT 29, AST 22, lipase 91.    Urinalysis is suggestive of a mild UTI.    Stool cultures, including C. diff are pending.    IMAGING DATA:  A CT of the abdomen and pelvis with contrast is currently pending.    ASSESSMENT:   1.  Nausea and vomiting, slowly starting to improve with Protonix drip.  2.  Severe epigastric abdominal pain.   3.  Leukocytosis. 4.  Hypokalemia.   5.  History of erosive gastritis, esophagitis and history of Clostridium difficile colitis.    PLAN: I have discussed this patient's case in detail with Dr. Lutricia Feil, who is involved in the development of the patient's plan of care.  At this time due to the patient's history of gastritis and esophagitis, we do agree with the patient being maintained on a Protonix drip for now and being given symptomatic management with pain control and anti-emetics as needed.  Due to the severity of her epigastric abdominal pain causing her to be tearful, we do agree with the patient undergoing a CT of the abdomen and pelvis with contrast, which the patient has already been scheduled for and is going down for it later on this evening.  Due to some recent looser stools and her history of Clostridium difficile, we will check a stool sample for this.  Also, in regards to her leukocytosis, should her pain persist and her white count remain this elevated, we would consider obtaining blood cultures x 2 to evaluate the source of infection.   She is mildly hypokalemic, which we do recommend electrolyte replacement, as this can help control her nausea and vomiting as well.  Pending the results of the CT scan and the progression of her symptoms through the night, we may or may not consider the patient for a repeat upper endoscopy at that time.  The above plan of care was discussed in detail with the patient, who verbalized understanding and is in agreement.  All questions were answered.    The above plan of care was discussed and agreed upon under a supervisory agreement between myself and Dr. Lutricia Feil.    Thank you so much for this consultation and allowing [place] to participate in the patient's plan of care.              ____________________________  Hardie Shackleton. Earle, PA-C kme:cc D: 08/25/2012 16:19:14 ET T: 08/25/2012 17:09:00 ET JOB#: [id number]  cc: Hardie Shackleton. Earle, PA-C, <Dictator> Hardie Shackleton EARLE PA ELECTRONICALLY SIGNED 08/30/2012 12:06

## 2014-10-13 NOTE — Consult Note (Signed)
Chief Complaint:  Subjective/Chief Complaint Feels better. Less abd pain and nausea. CT neg.   VITAL SIGNS/ANCILLARY NOTES: **Vital Signs.:   06-Mar-14 08:27  Vital Signs Type Routine  Temperature Temperature (F) 98.1  Celsius 36.7  Pulse Pulse 66  Respirations Respirations 20  Systolic BP Systolic BP 161  Diastolic BP (mmHg) Diastolic BP (mmHg) 096  Mean BP 116  Pulse Ox % Pulse Ox % 93  Pulse Ox Activity Level  At rest  Oxygen Delivery Room Air/ 21 %   Brief Assessment:  Cardiac Regular   Respiratory clear BS   Gastrointestinal epig tenderness   Lab Results: Routine Chem:  06-Mar-14 03:54   Glucose, Serum 98  BUN  5  Creatinine (comp) 0.72  Sodium, Serum 141  Potassium, Serum 3.8  Chloride, Serum  110  CO2, Serum 27  Calcium (Total), Serum 8.8  Anion Gap  4  Osmolality (calc) 278  eGFR (African American) >60  eGFR (Non-African American) >60 (eGFR values <80m/min/1.73 m2 may be an indication of chronic kidney disease (CKD). Calculated eGFR is useful in patients with stable renal function. The eGFR calculation will not be reliable in acutely ill patients when serum creatinine is changing rapidly. It is not useful in  patients on dialysis. The eGFR calculation may not be applicable to patients at the low and high extremes of body sizes, pregnant women, and vegetarians.)  Routine Hem:  06-Mar-14 03:54   WBC (CBC) 10.3  RBC (CBC) 4.50  Hemoglobin (CBC) 13.1  Hematocrit (CBC) 39.2  Platelet Count (CBC) 313  MCV 87  MCH 29.2  MCHC 33.5  RDW 14.2  Neutrophil % 47.4  Lymphocyte % 38.2  Monocyte % 10.3  Eosinophil % 3.2  Basophil % 0.9  Neutrophil # 4.9  Lymphocyte #  3.9  Monocyte #  1.1  Eosinophil # 0.3  Basophil # 0.1 (Result(s) reported on 26 Aug 2012 at 04:16AM.)   Assessment/Plan:  Assessment/Plan:  Assessment Abd pain. Likely recurrent gastritis. Feeling better. Discussed repeating EGD but likely to find similar findings as before. Pt not  anxious to repeat.THerefore, will not schedule EGD.   Plan Likely gastritis. Feeling better. Continue PPI and zofran. Advance diet as tolerated. Need to make sure that patient stays on daily PPI at home. Thanks   Electronic Signatures: OVerdie Shire(MD)  (Signed 06-Mar-14 12:18)  Authored: Chief Complaint, VITAL SIGNS/ANCILLARY NOTES, Brief Assessment, Lab Results, Assessment/Plan   Last Updated: 06-Mar-14 12:18 by OVerdie Shire(MD)

## 2014-10-13 NOTE — Consult Note (Signed)
Chief Complaint:  Subjective/Chief Complaint Continues to improve. No vomiting. Less abd pain. Wants to try solids. If tolerated, wants to go home.   VITAL SIGNS/ANCILLARY NOTES: **Vital Signs.:   07-Mar-14 05:07  Vital Signs Type Recheck  Systolic BP Systolic BP 409  Diastolic BP (mmHg) Diastolic BP (mmHg) 88  Mean BP 108  BP Source  if not from Vital Sign Device manual   Brief Assessment:  Cardiac Regular   Respiratory clear BS   Gastrointestinal mild epig tenderness   Lab Results: Routine Chem:  06-Mar-14 03:54   Glucose, Serum 98  BUN  5  Creatinine (comp) 0.72  Sodium, Serum 141  Potassium, Serum 3.8  Chloride, Serum  110  CO2, Serum 27  Calcium (Total), Serum 8.8  Anion Gap  4  Osmolality (calc) 278  eGFR (African American) >60  eGFR (Non-African American) >60 (eGFR values <32m/min/1.73 m2 may be an indication of chronic kidney disease (CKD). Calculated eGFR is useful in patients with stable renal function. The eGFR calculation will not be reliable in acutely ill patients when serum creatinine is changing rapidly. It is not useful in  patients on dialysis. The eGFR calculation may not be applicable to patients at the low and high extremes of body sizes, pregnant women, and vegetarians.)  07-Mar-14 08:50   Glucose, Serum 99  BUN  5  Creatinine (comp) 0.75  Sodium, Serum 140  Potassium, Serum 3.5  Chloride, Serum 106  CO2, Serum 30  Calcium (Total), Serum 8.6  Anion Gap  4  Osmolality (calc) 277  eGFR (African American) >60  eGFR (Non-African American) >60 (eGFR values <669mmin/1.73 m2 may be an indication of chronic kidney disease (CKD). Calculated eGFR is useful in patients with stable renal function. The eGFR calculation will not be reliable in acutely ill patients when serum creatinine is changing rapidly. It is not useful in  patients on dialysis. The eGFR calculation may not be applicable to patients at the low and high extremes of body sizes,  pregnant women, and vegetarians.)  Routine Hem:  06-Mar-14 03:54   WBC (CBC) 10.3  RBC (CBC) 4.50  Hemoglobin (CBC) 13.1  Hematocrit (CBC) 39.2  Platelet Count (CBC) 313  MCV 87  MCH 29.2  MCHC 33.5  RDW 14.2  Neutrophil % 47.4  Lymphocyte % 38.2  Monocyte % 10.3  Eosinophil % 3.2  Basophil % 0.9  Neutrophil # 4.9  Lymphocyte #  3.9  Monocyte #  1.1  Eosinophil # 0.3  Basophil # 0.1 (Result(s) reported on 26 Aug 2012 at 04:16AM.)   Assessment/Plan:  Assessment/Plan:  Assessment Likely recurrent gastritis.   Plan Try low fat, low chol diet. If tolerated, ok for discharge on PPI bid if possible. Thanks.   Electronic Signatures: OhVerdie ShireMD)  (Signed 07-Mar-14 10:44)  Authored: Chief Complaint, VITAL SIGNS/ANCILLARY NOTES, Brief Assessment, Lab Results, Assessment/Plan   Last Updated: 07-Mar-14 10:44 by OhVerdie ShireMD)

## 2014-10-13 NOTE — H&P (Signed)
PATIENT NAME:  Lynn Larson, Lynn Larson MR#:  [id number] DATE OF BIRTH:  19-Oct-1965  DATE OF ADMISSION:  08/25/2012  REFERRING PHYSICIAN: Rebecka Apley, MD   PRIMARY CARE PHYSICIAN: None, visits occasionally at [facility].   CHIEF COMPLAINT: Abdominal pain, nausea and vomiting.   HISTORY OF PRESENT ILLNESS: This is a 49 year old female with significant past medical history of severe erosive gastritis and esophagitis, chronic obstructive pulmonary disease, sphincter of Oddi dysfunction and history of C. difficile colitis, who presents with complaints of abdominal pain, nausea, vomiting. The patient reports she is having these symptoms going on for the last 2 to 3 days. Presented earlier today to ED with similar complaints, where she refused admission, where urinalysis was positive, given Cipro. Wanted to try to go home and see if her vomiting improved without admission. The patient continued to have vomiting at home, where she came back to the ED. The patient reports feeling feverish at home, but she was afebrile here. She had significant leukocytosis of 22,000; white count during her earlier visit to the ED today was 16,000. The patient denies any coffee-ground emesis, any bright red blood per rectum, any melena, any hematemesis, but as well complains of diarrhea x2 episodes today. Complains of some abdominal pain, mainly in the epigastric area. Reports she feels that these symptoms are typical for the last admissions, where she was found to have esophagitis/gastritis, where she feels pain in the epigastric area and some difficulty swallowing and some mild dysphagia. Repeat urinalysis does not show gross evidence of UTI, and the initial specimen did have significant epithelial cells, which may not have been clean catch. Due to the patient's significant nausea, vomiting and pain, hospitalist service was requested to admit the patient for further management of her symptoms. As well, the patient was found to  have potassium of 3.  PAST MEDICAL HISTORY: 1. Severe erosive gastritis and esophagitis and possible duodenitis.  2. NSAID-induced peptic ulcer disease.  3. Chronic obstructive pulmonary disease.  4. History of tobacco use, currently using E-cigarettes.   5. Colitis.  6. Sphincter of Oddi dysfunction.  7. History of C. difficile colitis in July 2011.  8. Hypertension.   SOCIAL HISTORY: The patient quit smoking recently, currently using E-cigarettes. No alcohol or drug abuse.   FAMILY HISTORY: No history of hypertension or diabetes, coronary artery disease.   ALLERGIES: MORPHINE AND CHANTIX.   HOME MEDICATIONS:  1. Albuterol 2 puffs daily.  2. Promethazine 25 one to two tablets every 4 to 6 hours as needed.   REVIEW OF SYSTEMS:  CONSTITUTIONAL: The patient denies any weight loss or weight gain. The patient complains of fever, generalized weakness and fatigue. Denies weight gain or weight loss.  EYES: Denies blurry vision, eye pain or discharge.  ENT: Denies sore throat, tinnitus, bloody nose, hearing loss, sinusitis.  RESPIRATORY: Denies cough, shortness of breath, hemoptysis, wheezing or pleurisy.  CARDIOVASCULAR: Denies chest pain, palpitations, edema, orthopnea or syncope.  GASTROINTESTINAL: Complains of nausea, vomiting, diarrhea, abdominal pain. Denies melena or hematemesis.  GENITOURINARY: Denies hematuria, dysuria, hesitancy or incontinence.  MUSCULOSKELETAL: Denies arthralgia, myalgia, joint weakness.  SKIN: Denies rash, pruritus or sores.  NEUROLOGICAL: Denies any ataxia, tremors, numbness, migraine.  ENDOCRINE: Denies polyuria, polydipsia, heat or cold intolerance.  PSYCHIATRIC: Denies depression, anxiety, alcohol abuse or drug abuse.  HEMATOLOGY: Denies easy bruising, bleeding diathesis or blood clots. ALLERGIES: Denies asthma, hayfever or hives.   PHYSICAL EXAMINATION:  VITAL SIGNS: Temperature 98.5, pulse 91, respiratory rate 18, blood pressure  160/67, saturating  92% on room air.  GENERAL: Well-nourished female lies in bed in mild distress due to pain.  HEENT: Head is atraumatic, normocephalic. Pupils equal and reactive to light. Pink conjunctivae. Anicteric sclerae. Moist oral mucosa.  NECK: Supple. No thyromegaly. No JVD.  CHEST: Good air entry bilaterally. No wheezing, rales, rhonchi.  CARDIOVASCULAR: S1, S2 heard. No rubs, murmurs or gallops.  ABDOMEN: Mild tenderness in the epigastric area. Bowel sounds present. No rebound, no guarding. No hepatosplenomegaly.  EXTREMITIES: No edema. No clubbing. No cyanosis.  PSYCHIATRIC: Appropriate affect. Awake, alert x3. Intact judgment and insight.  NEUROLOGIC: Cranial nerves grossly intact. Motor 5 out of 5. Sensation symmetrical.  SKIN: Normal skin turgor. Warm and dry.  LYMPHATICS: No cervical lymphadenopathy.  PERTINENT LABORATORY: Glucose 116, BUN 12, creatinine 0.56, sodium 138, potassium 3, chloride 102, CO2 27, ALT 29, AST 22, alkaline phosphatase 126. Troponin less than 0.02. White blood cells 22, hemoglobin 14.6, hematocrit 43, platelets 435. EKG showing normal sinus rhythm without significant ST or T wave changes. Urinalysis negative for leukocyte esterase and nitrite and 2 white blood cells.   ASSESSMENT AND PLAN:  1. Abdominal pain, nausea and vomiting. This is most likely related to gastritis/esophagitis as the patient is known to have history of severe erosive gastritis with esophagitis, with the current admission with similar presentation. Denies any recent NSAID use. No coffee-ground emesis, no melena. Will continue with hydration. Will keep the patient on clear liquid diet. Will start her on Protonix drip and on Maalox. Will have her on p.r.n. pain medications, Dilaudid, and p.r.n. Zofran and Phenergan. Will consult GI service as well. As well, the patient might be having some mild gastroenteritis as she is having diarrhea.  2. Leukocytosis. This is most likely related due to vomiting. The patient  had similar presentation in the past with significant leukocytosis with her nausea and vomiting. Will monitor closely. If no improvement after hydration and resolution of her vomiting, may need to obtain CT of abdomen. As well, given her history in the past of C. difficile and current diarrhea, will check C. difficile.  3. Hypokalemia. This is due to vomiting. Will replace. Will recheck level in a.m.  4. Chronic obstructive pulmonary disease. No wheezing. Will continue with albuterol.  5. Deep vein thrombosis prophylaxis. Subcutaneous heparin.  6. Gastrointestinal prophylaxis. The patient on Protonix drip   CODE STATUS: Full code.   TOTAL TIME SPENT ON ADMISSION AND PATIENT CARE: 55 minutes.   ____________________________ Starleen Armsawood S. Elgergawy, MD dse:OSi D: 08/25/2012 04:24:35 ET T: 08/25/2012 05:57:44 ET JOB#: [id number]  cc: Starleen Armsawood S. Elgergawy, MD, <Dictator> DAWOOD Teena IraniS ELGERGAWY MD ELECTRONICALLY SIGNED 09/02/2012 2:50

## 2014-10-14 NOTE — Discharge Summary (Signed)
PATIENT NAMEALECIA, DOI MR#:  [id number] DATE OF BIRTH:  Nov 11, 1965  DATE OF ADMISSION:  10/22/2013 DATE OF DISCHARGE:  10/24/2013  ADMITTING DIAGNOSIS: Chest pain due to severe reflux esophagitis.   DISCHARGE DIAGNOSES:   1. Chest pain due to acute esophagitis and gastroesophageal reflux disease.  2. Chronic obstructive pulmonary disease exacerbation.  3. History of hypertension.  4. Tobacco abuse. 5. Hyperlipidemia. 6. Severe reflux esophagitis.  DISCHARGE CONDITION: Stable.  DISCHARGE MEDICATIONS: The patient is to continue hydrochlorothiazide 25 mg once daily at bedtime, simvastatin unknown dose 1 tablet once at bedtime, sucralfate 1 gram every four hours, Zofran 4 mg every six hours as needed, acetaminophen hydrocodone 325/5 mg 1 tablet every four hours as needed. Combivent Respimat 100/20, 1 puff 4 times daily as needed, omeprazole 40 mg p.o. twice daily. NicoDerm transdermal film extended release 14 mg topically daily, tiotropium 18 mcg inhalation capsule once daily. The patient is not to take Celebrex.  HOME OXYGEN: None.   DIET: 2 grams salt, low-fat, low-cholesterol, mechanical soft, advance to regular consistency as tolerated.   FOLLOW-UP APPOINTMENT: With primary care physician with [facility] in two days after discharge, Dr. Adrian Blackwater cardiology tomorrow at 11:00 a.m. in his office for cardiac stress test.    CONSULTANTS: Care management, social work.   RADIOLOGIC STUDIES: Chest x-ray, portable single view, 10/22/2013, showed no active disease. CT scan of abdomen and pelvis with contrast 10/22/2013, revealed no acute findings. No findings to explain epigastric pain or vomiting status post cholecystectomy. Mild chronic intrahepatic and extrahepatic bile duct dilatation, small low density renal lesions, likely cysts.  Mild degenerative changes in the visualized spine.   The patient is a 49 year old Caucasian female with past medical history significant for a  history of reflux esophagitis who has not been taking her proton pump inhibitor, presented to the hospital with complaints of severe chest pain as well as intractable nausea and vomiting. Please refer to Dr. Melina Schools admission note on 10/22/2013. On arrival to the hospital, the patient's initial blood pressure was 146/83, pulse oximetry was 98% on room air, respiration rate was 20, temperature was 98.4 and pulse was in 90s. Her physical exam revealed mild tenderness in epigastric region. Otherwise, physical exam was unremarkable. EKG showed sinus rhythm with short PR. Chest x-ray was unremarkable. CT of abdomen and pelvis did not show any significant changes except of mild extrahepatic and intrahepatic bile duct dilatation and normal other studies. The patient's lab data done on admission revealed mild elevation of BUN to 19, glucose of 101, otherwise BMP was unremarkable. The patient's hCG beta subunit was 1. Liver enzymes: Mild elevation of total protein of 8.4, otherwise liver enzymes were normal. Cardiac enzymes x 2 were within normal limits. White blood cell count was initially mildly elevated to 13.9, hemoglobin was 14.9, platelet count 253. D-dimer was normal at 395. Urinalysis was remarkable for eight red blood cells, less than 1 white blood cells, 2+ blood, negative for protein, nitrites or leukocyte esterase. EKG showed poor data quality, interpretation may be adversely affected. Sinus rhythm with short PR at 95 beats per minute, ST abnormality, possible digitalis effect.  No acute ST-T changes were noted. The patient was admitted to the hospital for further evaluation. She was initiated on Carafate around the clock, as well as PPI. With this, her condition significantly improved. Over a period of time she felt much more comfortable. She was able to eat and drink fluids, was not in any significant discomfort. She  was also somewhat rehydrated with IV fluids. On the day of discharge 10/24/2013 she was ready  to go home. She was advised to advance her diet slowly and continue her medications. She is to follow up with [facility] for further recommendations. Meanwhile, she is to continue sucralfate as well as omeprazole. Since she was having some chest pains, we felt that the patient would benefit also from stress test as outpatient. I discussed her case over the weekend with Dr. Adrian BlackwaterShaukat Khan, who recommended for the patient to follow up in his office, tomorrow, 10/25/2013 for possible setting up a stress test as outpatient.   In regards to COPD, the patient was relatively comfortable on the day of admission; however, the next day, she developed some wheezing and cough. She was felt to have mild COPD exacerbation. She was initiated on DuoNebs; however, was not able to tolerate much of those.  She, however, was able to tolerate Combivent Respimat and she was recommended  to continue that medication. With conservative therapy her condition improved.   For history of hypertension, tobacco abuse. She was counseled about tobacco and nicotine replacement therapy was initiated. For hypertension, the patient is to continue her outpatient medications.   For hyperlipidemia, the patient is to continue simvastatin.   The patient is being discharged in stable condition with the above-mentioned medications and followup. Her vital signs on the day of discharge, temperature was 97.6, pulse was 63, respiration rate was 18, blood pressure 148/81. Saturation was 99% on room air at rest.   TIME SPENT: 40 minutes on this patient.   ____________________________ Katharina Caperima Jakim Drapeau, MD rv:sg D: 10/24/2013 19:07:39 ET T: 10/25/2013 09:03:34 ET JOB#: [id number]  cc: Katharina Caperima Karrah Mangini, MD, <Dictator> [facility]   Charniece Venturino MD ELECTRONICALLY SIGNED 11/10/2013 10:14

## 2014-10-14 NOTE — H&P (Signed)
PATIENT NAMAnder Larson:  Lynn Larson, Lynn Larson MR#:  [id number] DATE OF BIRTH:  [birth year]  DATE OF ADMISSION:  10/22/2013  CHIEF COMPLAINT: Severe chest pain and intractable nausea and vomiting.   HISTORY OF PRESENT ILLNESS: Ms. Lynn Larson is a 49 year old white female with a history of severe esophagitis with prior admissions for uncontrolled nausea and vomiting with chest pain who has not been taking a PPI for some time and developed nausea last evening, which resolved with sucralfate but then developed intractable nausea and vomiting while at work this afternoon. The patient states that she has not been taking a PPI for some time but has been taking over-the-counter ranitidine 150 mg daily. She does not follow any particular diet to avoid reflux. She has been recently transferred her medical care from 1 [facility] to another and has been without medications for a while. Last evening, she woke up at 2:30 in the morning with a cold sweat and elevated blood pressure accompanied by nausea. She took a sublingual Zofran x 2 and went back to sleep. When she went to work today she developed sudden onset of flushing, nausea and burning esophageal pain and started vomiting at work. In the Emergency Room, she has been given several doses of Dilaudid for pain along with Zofran and Ativan all of which have provided only transient relief. Her symptoms were aggravated by having to drink the contrast for the CT of the abdomen and pelvis which ultimately turned out to be normal. She is being admitted for management of severe esophagitis. The patient's primary care doctor is unknown. She has transferred recently from the [facility] to [facility]. 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 She also has a history of COPD from ongoing tobacco abuse, history of Clostridium difficile colitis in July 2011 and history of hypertension.   MEDICATIONS: Are: Hydrochlorothiazide and simvastatin, 25 mg for  the hydrochlorothiazide and the simvastatin is 20 mg daily.   PAST SURGERIES: Include cholecystectomy.   ALLERGIES: MORPHINE AND CHANTIX.    LAST HOSPITALIZATION: At [facility] in March 2014 for reflux esophagitis, severe.   PERTINENT FAMILY HISTORY: Notable for an absence of coronary artery disease, diabetes, or hypertension.   SOCIAL HISTORY: She is a current half a pack a day smoker. She does not drink alcohol. She works [employer].   REVIEW OF SYSTEMS: Negative for fever, fatigue, weakness, and weight loss. No recent changes in vision. No seasonal rhinitis, epistaxis, or sinus pain. No cough, wheezing, or hemoptysis. She denies dyspnea. She is currently endorsing chest pain secondary to severe esophagitis. She denies orthopnea, dyspnea on exertion, and syncope. She has been having nausea and vomiting and is continuing to do so here in the Emergency Room. No hematemesis, melena, or ulcers. Positive history of GERD per HPI. She denies dysuria, hematuria, and frequency. She denies polyuria and nocturia. She does have increased sweating at night. She denied any neck, back, shoulder, knee, or hip pain. She denies any numbness, weakness, or dysarthria. No history of seizures or memory loss. She denies any chronic anxiety or insomnia   PHYSICAL EXAMINATION:  VITAL SIGNS: Initial blood pressure was 146/83, repeat 121/75, pulse is in the mid 90s initially and now 66, rhythm is regular, temperature 98.4, respirations are 20, sating 98% on room air.  HEENT: Pupils are equal, round, and reactive to light. Extraocular movements are intact. Sclerae are not icteric. Mucosa is dry.  NECK: Supple without lymphadenopathy, JVD, thyromegaly, or carotid bruits.  LUNGS:  Clear to auscultation bilaterally with occasional rhonchi and wheezing.  CARDIOVASCULAR: Regular rate and rhythm. No murmurs, rubs, or gallops. She has good pedal pulses and no lower extremity edema. Chest wall is nontender.  ABDOMEN: Soft, tender without  guarding in the mid epigastric region. Bowel sounds are normal.  MUSCULOSKELETAL: 5/5 strength.  SKIN: Skin is warm and dry without rashes or lesions.  LYMPH: There is no cervical, axillary,  inguinal, or supraclavicular lymphadenopathy.  NEUROLOGICAL: Grossly nonfocal. She is alert and oriented and in mild distress secondary to pain.   ADMISSION LABORATORY DATA: Sodium 136, potassium 3.6, chloride 106, bicarbonate 24, BUN 19, creatinine 0.5, and glucose 101. White count is 13.9, hemoglobin is 14.9. Platelets are 353,000. AST is 25, ALT is 34, alkaline phosphatase is 105 and lipase is 97. Urine is positive for 8 red blood cells. No white cells or leukocyte esterase or nitrates. Troponin is normal at less than 0.02. D-dimer is normal at 395. UPT is negative.   Her EKG shows sinus rhythm with short PR. Chest x-ray is normal. CT of the abdomen and pelvis with IV and oral contrast shows mild extra and intrahepatic bile duct dilation, normal liver, pancreas, and spleen. No adrenal masses. Positive possible renal cysts. Bowels appear normal.   ASSESSMENT AND PLAN:  1. Chest pain secondary to severe reflux esophagitis secondary to noncompliance with proton pump inhibitor therapy. Protonix IV b.i.d. he has been ordered. The patient received Pepcid and Zofran in the Emergency Room and is still quite symptomatic. I have also ordered hyoscyamine sublingual tablets for the esophageal spasm and sucralfate as this has helped her in the past. The patient will need significant the patient education to understand their proton pump inhibitors and 2-blockers are not at all the same medications and given her extreme disease, she will need to continue a prescription-strength proton pump inhibitor going forward.  2. Chronic obstructive pulmonary disease: The patient is currently in having mild expiratory wheezes. I would not want to use of prednisone on her at this time. Will give albuterol and nebulizers as needed every 6  hours.  3. Tobacco abuse. Counseling on tobacco cessation was given today amounting to 3 minutes by me. She is interested in quitting. She has a prior adverse reaction to Chantix. Nicoderm patch ordered.  4. Hypertension. Given her nausea and vomiting, will suspend hydrochlorothiazide currently and consider adding an ACE inhibitor.   ESTIMATED TIME OF CARE: 60 minutes.     ____________________________ Duncan Dull, MD tt:lt D: 10/22/2013 19:19:48 ET T: 10/22/2013 21:15:47 ET JOB#: [id number]  cc: Duncan Dull, MD, <Dictator> Duncan Dull MD ELECTRONICALLY SIGNED 10/25/2013 7:52

## 2015-01-23 ENCOUNTER — Other Ambulatory Visit: Payer: Self-pay

## 2015-01-23 ENCOUNTER — Emergency Department
Admission: EM | Admit: 2015-01-23 | Discharge: 2015-01-23 | Disposition: A | Discharge status: Home / Self Care | Payer: Self-pay | Attending: Emergency Medicine | Admitting: Emergency Medicine

## 2015-01-23 ENCOUNTER — Encounter: Payer: Self-pay | Admitting: Emergency Medicine

## 2015-01-23 DIAGNOSIS — R197 Diarrhea, unspecified: Secondary | ICD-10-CM | POA: Insufficient documentation

## 2015-01-23 DIAGNOSIS — R51 Headache: Secondary | ICD-10-CM | POA: Insufficient documentation

## 2015-01-23 DIAGNOSIS — I10 Essential (primary) hypertension: Secondary | ICD-10-CM | POA: Insufficient documentation

## 2015-01-23 DIAGNOSIS — R1013 Epigastric pain: Secondary | ICD-10-CM | POA: Insufficient documentation

## 2015-01-23 DIAGNOSIS — Z720 Tobacco use: Secondary | ICD-10-CM | POA: Insufficient documentation

## 2015-01-23 DIAGNOSIS — R112 Nausea with vomiting, unspecified: Secondary | ICD-10-CM | POA: Insufficient documentation

## 2015-01-23 DIAGNOSIS — R1110 Vomiting, unspecified: Secondary | ICD-10-CM

## 2015-01-23 HISTORY — DX: Essential (primary) hypertension: I10

## 2015-01-23 LAB — CBC
HCT: 49.5 % — ABNORMAL HIGH (ref 35.0–47.0)
Hemoglobin: 16.5 g/dL — ABNORMAL HIGH (ref 12.0–16.0)
MCH: 28.9 pg (ref 26.0–34.0)
MCHC: 33.4 g/dL (ref 32.0–36.0)
MCV: 86.5 fL (ref 80.0–100.0)
PLATELETS: 371 10*3/uL (ref 150–440)
RBC: 5.72 MIL/uL — AB (ref 3.80–5.20)
RDW: 14.8 % — AB (ref 11.5–14.5)
WBC: 16.1 10*3/uL — AB (ref 3.6–11.0)

## 2015-01-23 LAB — COMPREHENSIVE METABOLIC PANEL
ALT: 16 U/L (ref 14–54)
AST: 18 U/L (ref 15–41)
Albumin: 4.8 g/dL (ref 3.5–5.0)
Alkaline Phosphatase: 93 U/L (ref 38–126)
Anion gap: 10 (ref 5–15)
BUN: 19 mg/dL (ref 6–20)
CALCIUM: 9.8 mg/dL (ref 8.9–10.3)
CHLORIDE: 108 mmol/L (ref 101–111)
CO2: 21 mmol/L — AB (ref 22–32)
Creatinine, Ser: 0.72 mg/dL (ref 0.44–1.00)
Glucose, Bld: 133 mg/dL — ABNORMAL HIGH (ref 65–99)
POTASSIUM: 4 mmol/L (ref 3.5–5.1)
Sodium: 139 mmol/L (ref 135–145)
TOTAL PROTEIN: 8.7 g/dL — AB (ref 6.5–8.1)
Total Bilirubin: 0.8 mg/dL (ref 0.3–1.2)

## 2015-01-23 LAB — TROPONIN I: Troponin I: <0.03 ng/mL (ref <0.031)

## 2015-01-23 LAB — LIPASE, BLOOD: Lipase: 19 U/L — ABNORMAL LOW (ref 22–51)

## 2015-01-23 MED ORDER — ONDANSETRON HCL 4 MG/2ML IJ SOLN
4.0000 mg | Freq: Once | INTRAMUSCULAR | Status: AC
  Administered 2015-01-23: 4 mg via INTRAVENOUS
  Filled 2015-01-23: qty 2

## 2015-01-23 MED ORDER — GI COCKTAIL ~~LOC~~
30.0000 mL | Freq: Once | ORAL | Status: AC
  Administered 2015-01-23: 30 mL via ORAL
  Filled 2015-01-23: qty 30

## 2015-01-23 MED ORDER — FENTANYL CITRATE (PF) 100 MCG/2ML IJ SOLN
100.0000 ug | Freq: Once | INTRAMUSCULAR | Status: AC
  Administered 2015-01-23: 100 ug via INTRAVENOUS
  Filled 2015-01-23: qty 2

## 2015-01-23 MED ORDER — METOCLOPRAMIDE HCL 10 MG PO TABS
10.0000 mg | ORAL_TABLET | Freq: Three times a day (TID) | ORAL | Status: DC
Start: 2015-01-23 — End: 2015-02-15

## 2015-01-23 MED ORDER — SODIUM CHLORIDE 0.9 % IV BOLUS (SEPSIS)
1000.0000 mL | Freq: Once | INTRAVENOUS | Status: AC
  Administered 2015-01-23: 1000 mL via INTRAVENOUS
  Filled 2015-01-23: qty 1000

## 2015-01-23 MED ORDER — METOCLOPRAMIDE HCL 5 MG/ML IJ SOLN
10.0000 mg | Freq: Once | INTRAMUSCULAR | Status: AC
  Administered 2015-01-23: 10 mg via INTRAVENOUS
  Filled 2015-01-23: qty 2

## 2015-01-23 NOTE — ED Notes (Signed)
Pt tolerating fluids well. 

## 2015-01-23 NOTE — ED Notes (Signed)
Patient presents to ED with complaint of headache, epigastric pain, nausea, vomiting, and diarrhea. Patient reports epigastric pain, n/v/d since 5 pm yesterday and headache began x 1-2 hours ago. Patient reports taking Pepto and Dramamine without relief. Patient states "I have been admitted before for constant vomiting...no one has been sick at home." Patient reports photosensitively but denies blurry vision. Denies chest pain, shortness of breath, or other complaints at this time. Patient alert and oriented x 4, respirations even and unlabored, call bell within reach. Dr. Zenda Alpers at bedside.

## 2015-01-23 NOTE — ED Provider Notes (Signed)
-----------------------------------------   8:34 AM on 01/23/2015 -----------------------------------------   Blood pressure 106/65, pulse 75, temperature 97.6 F (36.4 C), temperature source Oral, resp. rate 18, height  (1.575 m), weight 160 lb (72.576 kg), SpO2 97 %.   In short, Lynn Larson is a 49 y.o. female with a chief complaint of Emesis; Diarrhea; and Headache Refer to the original H&P for additional details.  The patient's abdominal pain at this time is improved as is her nausea and vomiting. We will have a by mouth trial and the patient will be discharged to home to follow-up with her primary care physician. The patient is here with the plan as stated.  Rebecka Apley, MD 01/23/15 [phone]

## 2015-01-23 NOTE — ED Provider Notes (Signed)
[facility] Emergency Department Provider Note  ____________________________________________  Time seen: Approximately 428 AM  I have reviewed the triage vital signs and the nursing notes.   HISTORY  Chief Complaint Emesis; Diarrhea; and Headache    HPI Lynn Larson is a 49 y.o. female who comes in today with abdominal pain, vomiting and diarrhea. The patient reports that she also had a bad headache. The patient reports that this all started at 5 PM. She took Dramamine and Pepto-Bismol but reports that it did not help. She reports that she vomited probably 12 times. She reports initially that it was food and then it turned yellow. The patient also reports that she's had 5 episodes of brown diarrhea that has not had any blood in it. The patient denies any sick contacts. Reports that the pain in her abdomen is all over cramping and stabbing. It is a 9 out of 10 in intensity. The patient also has a frontal headache and reports that she does not typically have headaches this bad but she has had migraines in the past. She denies any blurred vision. She reports that the headache started approximately 1-2 hours prior to coming into the hospital. The patient reports that she has some mild sensitivity to light as well. She reports that she's been trying to take sips of water but is unable to even keep down sips of water. The patient reports that she's had a history of cyclic vomiting where she had to be admitted for her vomiting in the past. She came in is she did not want the symptoms to get very severe.   Past Medical History  Diagnosis Date  . Hypertension     There are no active problems to display for this patient.   Past Surgical History  Procedure Laterality Date  . Cholecystectomy      No current outpatient prescriptions on file.  Allergies Review of patient's allergies indicates no known allergies.  No family history on file.  Social History History   Substance Use Topics  . Smoking status: Current Every Day Smoker -- 0.50 packs/day    Types: Cigarettes  . Smokeless tobacco: Not on file  . Alcohol Use: No    Review of Systems Constitutional: No fever/chills Eyes: No visual changes. ENT: No sore throat. Cardiovascular: Denies chest pain. Respiratory: Denies shortness of breath. Gastrointestinal: Nausea vomiting diarrhea and abdominal pain Genitourinary: Negative for dysuria. Musculoskeletal: Negative for back pain. Skin: Negative for rash. Neurological: Headache  10-point ROS otherwise negative.  ____________________________________________   PHYSICAL EXAM:  VITAL SIGNS: ED Triage Vitals  Enc Vitals Group     BP 01/23/15 0406 154/137 mmHg     Pulse Rate 01/23/15 0406 107     Resp 01/23/15 0406 22     Temp 01/23/15 0406 97.6 F (36.4 C)     Temp Source 01/23/15 0406 Oral     SpO2 01/23/15 0406 97 %     Weight 01/23/15 0406 160 lb (72.576 kg)     Height 01/23/15 0406 5\' 2"  (1.575 m)     Head Cir --      Peak Flow --      Pain Score 01/23/15 0404 9     Pain Loc --      Pain Edu? --      Excl. in GC? --     Constitutional: Alert and oriented. Well appearing and in moderate distress. Eyes: Conjunctivae are normal. PERRL. EOMI. Head: Atraumatic. Nose: No congestion/rhinnorhea. Mouth/Throat: Mucous membranes  are moist.  Oropharynx non-erythematous. Cardiovascular: Normal rate, regular rhythm. Grossly normal heart sounds.  Good peripheral circulation. Respiratory: Normal respiratory effort.  No retractions. Lungs CTAB. Gastrointestinal: Soft with upper abdominal tenderness to palpation. No distention. Positive bowel sounds  Genitourinary: deferred Musculoskeletal: No lower extremity tenderness nor edema.   Neurologic:  Normal speech and language. No gross focal neurologic deficits are appreciated.  Skin:  Skin is warm, dry and intact.  Psychiatric: Mood and affect are normal.    ____________________________________________   LABS (all labs ordered are listed, but only abnormal results are displayed)  Labs Reviewed  CBC - Abnormal; Notable for the following:    WBC 16.1 (*)    RBC 5.72 (*)    Hemoglobin 16.5 (*)    HCT 49.5 (*)    RDW 14.8 (*)    All other components within normal limits  COMPREHENSIVE METABOLIC PANEL - Abnormal; Notable for the following:    CO2 21 (*)    Glucose, Bld 133 (*)    Total Protein 8.7 (*)    All other components within normal limits  LIPASE, BLOOD - Abnormal; Notable for the following:    Lipase 19 (*)    All other components within normal limits  TROPONIN I   ____________________________________________  EKG  ED ECG REPORT I, Rebecka Apley, the attending physician, personally viewed and interpreted this ECG.   Date: 01/23/2015  EKG Time: 459  Rate: 90  Rhythm: normal sinus rhythm  Axis: Normal  Intervals:none  ST&T Change: None  ____________________________________________  RADIOLOGY  None ____________________________________________   PROCEDURES  Procedure(s) performed: None  Critical Care performed: No  ____________________________________________   INITIAL IMPRESSION / ASSESSMENT AND PLAN / ED COURSE  Pertinent labs & imaging results that were available during my care of the patient were reviewed by me and considered in my medical decision making (see chart for details).  This is a 49 year old female who is a history of cyclic vomiting syndrome who comes in today with vomiting and diarrhea. The patient reports that she also had some headache. I will give the patient some normal saline Reglan and morphine for her pain. The patient reports that the medication seemed to help the pain as well as the nausea. The patient will receive a second dose of normal saline and her abdomen will be reassessed after she receives her normal saline.  The patient's care was signed out to Dr. Juliette Alcide who will  reassess her abdomen and follow-up after the fluid. ____________________________________________   FINAL CLINICAL IMPRESSION(S) / ED DIAGNOSES  Final diagnoses:  None      Rebecka Apley, MD 01/23/15 [phone]

## 2015-01-23 NOTE — ED Notes (Signed)
Patient requesting nausea medicine, see MAR.

## 2015-01-23 NOTE — ED Notes (Signed)
Patient ambulatory to triage with steady gait, without difficulty or distress noted; pt reports frontal HA, N/V/D since 5pm

## 2015-01-23 NOTE — ED Provider Notes (Signed)
Patient now keeping fluids down by mouth is ready to go home we'll discharge her as planned patient will get her prescription for Reglan Dr. Gust Rung, MD 01/23/15 1032

## 2015-01-23 NOTE — Discharge Instructions (Signed)
Abdominal Pain °Many things can cause abdominal pain. Usually, abdominal pain is not caused by a disease and will improve without treatment. It can often be observed and treated at home. Your health care provider will do a physical exam and possibly order blood tests and X-rays to help determine the seriousness of your pain. However, in many cases, more time must pass before a clear cause of the pain can be found. Before that point, your health care provider may not know if you need more testing or further treatment. °HOME CARE INSTRUCTIONS  °Monitor your abdominal pain for any changes. The following actions may help to alleviate any discomfort you are experiencing: °· Only take over-the-counter or prescription medicines as directed by your health care provider. °· Do not take laxatives unless directed to do so by your health care provider. °· Try a clear liquid diet (broth, tea, or water) as directed by your health care provider. Slowly move to a bland diet as tolerated. °SEEK MEDICAL CARE IF: °· You have unexplained abdominal pain. °· You have abdominal pain associated with nausea or diarrhea. °· You have pain when you urinate or have a bowel movement. °· You experience abdominal pain that wakes you in the night. °· You have abdominal pain that is worsened or improved by eating food. °· You have abdominal pain that is worsened with eating fatty foods. °· You have a fever. °SEEK IMMEDIATE MEDICAL CARE IF:  °· Your pain does not go away within 2 hours. °· You keep throwing up (vomiting). °· Your pain is felt only in portions of the abdomen, such as the right side or the left lower portion of the abdomen. °· You pass bloody or black tarry stools. °MAKE SURE YOU: °· Understand these instructions.   °· Will watch your condition.   °· Will get help right away if you are not doing well or get worse.   °Document Released: 03/19/2005 Document Revised: 06/14/2013 Document Reviewed: 02/16/2013 °[employer]® Patient Information  ©2015 [employer]. This information is not intended to replace advice given to you by your health care provider. Make sure you discuss any questions you have with your health care provider. ° °Nausea and Vomiting °Nausea is a sick feeling that often comes before throwing up (vomiting). Vomiting is a reflex where stomach contents come out of your mouth. Vomiting can cause severe loss of body fluids (dehydration). Children and elderly adults can become dehydrated quickly, especially if they also have diarrhea. Nausea and vomiting are symptoms of a condition or disease. It is important to find the cause of your symptoms. °CAUSES  °· Direct irritation of the stomach lining. This irritation can result from increased acid production (gastroesophageal reflux disease), infection, food poisoning, taking certain medicines (such as nonsteroidal anti-inflammatory drugs), alcohol use, or tobacco use. °· Signals from the brain. These signals could be caused by a headache, heat exposure, an inner ear disturbance, increased pressure in the brain from injury, infection, a tumor, or a concussion, pain, emotional stimulus, or metabolic problems. °· An obstruction in the gastrointestinal tract (bowel obstruction). °· Illnesses such as diabetes, hepatitis, gallbladder problems, appendicitis, kidney problems, cancer, sepsis, atypical symptoms of a heart attack, or eating disorders. °· Medical treatments such as chemotherapy and radiation. °· Receiving medicine that makes you sleep (general anesthetic) during surgery. °DIAGNOSIS °Your caregiver may ask for tests to be done if the problems do not improve after a few days. Tests may also be done if symptoms are severe or if the reason for the nausea   and vomiting is not clear. Tests may include: °· Urine tests. °· Blood tests. °· Stool tests. °· Cultures (to look for evidence of infection). °· X-rays or other imaging studies. °Test results can help your caregiver make decisions about  treatment or the need for additional tests. °TREATMENT °You need to stay well hydrated. Drink frequently but in small amounts. You may wish to drink water, sports drinks, clear broth, or eat frozen ice pops or gelatin dessert to help stay hydrated. When you eat, eating slowly may help prevent nausea. There are also some antinausea medicines that may help prevent nausea. °HOME CARE INSTRUCTIONS  °· Take all medicine as directed by your caregiver. °· If you do not have an appetite, do not force yourself to eat. However, you must continue to drink fluids. °· If you have an appetite, eat a normal diet unless your caregiver tells you differently. °· Eat a variety of complex carbohydrates (rice, wheat, potatoes, bread), lean meats, yogurt, fruits, and vegetables. °· Avoid high-fat foods because they are more difficult to digest. °· Drink enough water and fluids to keep your urine clear or pale yellow. °· If you are dehydrated, ask your caregiver for specific rehydration instructions. Signs of dehydration may include: °· Severe thirst. °· Dry lips and mouth. °· Dizziness. °· Dark urine. °· Decreasing urine frequency and amount. °· Confusion. °· Rapid breathing or pulse. °SEEK IMMEDIATE MEDICAL CARE IF:  °· You have blood or brown flecks (like coffee grounds) in your vomit. °· You have black or bloody stools. °· You have a severe headache or stiff neck. °· You are confused. °· You have severe abdominal pain. °· You have chest pain or trouble breathing. °· You do not urinate at least once every 8 hours. °· You develop cold or clammy skin. °· You continue to vomit for longer than 24 to 48 hours. °· You have a fever. °MAKE SURE YOU:  °· Understand these instructions. °· Will watch your condition. °· Will get help right away if you are not doing well or get worse. °Document Released: 06/09/2005 Document Revised: 09/01/2011 Document Reviewed: 11/06/2010 °ExitCare® Patient Information ©2015 [employer]. This information is not  intended to replace advice given to you by your health care provider. Make sure you discuss any questions you have with your health care provider. °Diarrhea °Diarrhea is frequent loose and watery bowel movements. It can cause you to feel weak and dehydrated. Dehydration can cause you to become tired and thirsty, have a dry mouth, and have decreased urination that often is dark yellow. Diarrhea is a sign of another problem, most often an infection that will not last long. In most cases, diarrhea typically lasts 2-3 days. However, it can last longer if it is a sign of something more serious. It is important to treat your diarrhea as directed by your caregiver to lessen or prevent future episodes of diarrhea. °CAUSES  °Some common causes include: °· Gastrointestinal infections caused by viruses, bacteria, or parasites. °· Food poisoning or food allergies. °· Certain medicines, such as antibiotics, chemotherapy, and laxatives. °· Artificial sweeteners and fructose. °· Digestive disorders. °HOME CARE INSTRUCTIONS °· Ensure adequate fluid intake (hydration): Have 1 cup (8 oz) of fluid for each diarrhea episode. Avoid fluids that contain simple sugars or sports drinks, fruit juices, whole milk products, and sodas. Your urine should be clear or pale yellow if you are drinking enough fluids. Hydrate with an oral rehydration solution that you can purchase at pharmacies, retail stores, and online. You can   prepare an oral rehydration solution at home by mixing the following ingredients together: °¨  - tsp table salt. °¨ ¾ tsp baking soda. °¨  tsp salt substitute containing potassium chloride. °¨ 1  tablespoons sugar. °¨ 1 L (34 oz) of water. °· Certain foods and beverages may increase the speed at which food moves through the gastrointestinal (GI) tract. These foods and beverages should be avoided and include: °¨ Caffeinated and alcoholic beverages. °¨ High-fiber foods, such as raw fruits and vegetables, nuts, seeds, and whole  grain breads and cereals. °¨ Foods and beverages sweetened with sugar alcohols, such as xylitol, sorbitol, and mannitol. °· Some foods may be well tolerated and may help thicken stool including: °¨ Starchy foods, such as rice, toast, pasta, low-sugar cereal, oatmeal, grits, baked potatoes, crackers, and bagels. °¨ Bananas. °¨ Applesauce. °· Add probiotic-rich foods to help increase healthy bacteria in the GI tract, such as yogurt and fermented milk products. °· Wash your hands well after each diarrhea episode. °· Only take over-the-counter or prescription medicines as directed by your caregiver. °· Take a warm bath to relieve any burning or pain from frequent diarrhea episodes. °SEEK IMMEDIATE MEDICAL CARE IF:  °· You are unable to keep fluids down. °· You have persistent vomiting. °· You have blood in your stool, or your stools are black and tarry. °· You do not urinate in 6-8 hours, or there is only a small amount of very dark urine. °· You have abdominal pain that increases or localizes. °· You have weakness, dizziness, confusion, or light-headedness. °· You have a severe headache. °· Your diarrhea gets worse or does not get better. °· You have a fever or persistent symptoms for more than 2-3 days. °· You have a fever and your symptoms suddenly get worse. °MAKE SURE YOU:  °· Understand these instructions. °· Will watch your condition. °· Will get help right away if you are not doing well or get worse. °Document Released: 05/30/2002 Document Revised: 10/24/2013 Document Reviewed: 02/15/2012 °ExitCare® Patient Information ©2015 [employer]. This information is not intended to replace advice given to you by your health care provider. Make sure you discuss any questions you have with your health care provider. ° °

## 2015-02-04 ENCOUNTER — Encounter: Payer: Self-pay | Admitting: Emergency Medicine

## 2015-02-04 ENCOUNTER — Emergency Department: Payer: Self-pay

## 2015-02-04 ENCOUNTER — Emergency Department
Admission: EM | Admit: 2015-02-04 | Discharge: 2015-02-05 | Disposition: A | Discharge status: Home / Self Care | Payer: Self-pay | Attending: Emergency Medicine | Admitting: Emergency Medicine

## 2015-02-04 DIAGNOSIS — F419 Anxiety disorder, unspecified: Secondary | ICD-10-CM | POA: Insufficient documentation

## 2015-02-04 DIAGNOSIS — K529 Noninfective gastroenteritis and colitis, unspecified: Secondary | ICD-10-CM | POA: Insufficient documentation

## 2015-02-04 DIAGNOSIS — N390 Urinary tract infection, site not specified: Secondary | ICD-10-CM | POA: Insufficient documentation

## 2015-02-04 DIAGNOSIS — Z79899 Other long term (current) drug therapy: Secondary | ICD-10-CM | POA: Insufficient documentation

## 2015-02-04 DIAGNOSIS — R1033 Periumbilical pain: Secondary | ICD-10-CM | POA: Insufficient documentation

## 2015-02-04 DIAGNOSIS — I10 Essential (primary) hypertension: Secondary | ICD-10-CM | POA: Insufficient documentation

## 2015-02-04 DIAGNOSIS — Z720 Tobacco use: Secondary | ICD-10-CM | POA: Insufficient documentation

## 2015-02-04 HISTORY — DX: Unspecified asthma, uncomplicated: J45.909

## 2015-02-04 LAB — CBC
HEMATOCRIT: 46.3 % (ref 35.0–47.0)
HEMOGLOBIN: 15.5 g/dL (ref 12.0–16.0)
MCH: 28.5 pg (ref 26.0–34.0)
MCHC: 33.5 g/dL (ref 32.0–36.0)
MCV: 85.1 fL (ref 80.0–100.0)
PLATELETS: 383 10*3/uL (ref 150–440)
RBC: 5.44 MIL/uL — AB (ref 3.80–5.20)
RDW: 14.8 % — AB (ref 11.5–14.5)
WBC: 23.8 10*3/uL — AB (ref 3.6–11.0)

## 2015-02-04 LAB — URINALYSIS COMPLETE WITH MICROSCOPIC (ARMC ONLY)
BILIRUBIN URINE: NEGATIVE
GLUCOSE, UA: NEGATIVE mg/dL
Ketones, ur: NEGATIVE mg/dL
Leukocytes, UA: NEGATIVE
Nitrite: POSITIVE — AB
Protein, ur: NEGATIVE mg/dL
Specific Gravity, Urine: 1.009 (ref 1.005–1.030)
pH: 5 (ref 5.0–8.0)

## 2015-02-04 LAB — COMPREHENSIVE METABOLIC PANEL
ALT: 16 U/L (ref 14–54)
AST: 18 U/L (ref 15–41)
Albumin: 4.8 g/dL (ref 3.5–5.0)
Alkaline Phosphatase: 87 U/L (ref 38–126)
Anion gap: 10 (ref 5–15)
BUN: 18 mg/dL (ref 6–20)
CALCIUM: 9.7 mg/dL (ref 8.9–10.3)
CO2: 23 mmol/L (ref 22–32)
Chloride: 106 mmol/L (ref 101–111)
Creatinine, Ser: 0.62 mg/dL (ref 0.44–1.00)
GFR calc Af Amer: >60 mL/min (ref >60)
GFR calc non Af Amer: >60 mL/min (ref >60)
Glucose, Bld: 124 mg/dL — ABNORMAL HIGH (ref 65–99)
POTASSIUM: 3.6 mmol/L (ref 3.5–5.1)
Sodium: 139 mmol/L (ref 135–145)
TOTAL PROTEIN: 7.9 g/dL (ref 6.5–8.1)
Total Bilirubin: 0.6 mg/dL (ref 0.3–1.2)

## 2015-02-04 LAB — LIPASE, BLOOD: Lipase: 21 U/L — ABNORMAL LOW (ref 22–51)

## 2015-02-04 MED ORDER — PROMETHAZINE HCL 12.5 MG PO TABS: 12.5000 mg | ORAL_TABLET | Freq: Four times a day (QID) | ORAL | Status: DC | PRN

## 2015-02-04 MED ORDER — SODIUM CHLORIDE 0.9 % IV BOLUS (SEPSIS)
1000.0000 mL | Freq: Once | INTRAVENOUS | Status: AC
  Administered 2015-02-04: 1000 mL via INTRAVENOUS

## 2015-02-04 MED ORDER — PROMETHAZINE HCL 12.5 MG RE SUPP: 12.5000 mg | Freq: Four times a day (QID) | RECTAL | Status: DC | PRN

## 2015-02-04 MED ORDER — IOHEXOL 240 MG/ML SOLN
25.0000 mL | Freq: Once | INTRAMUSCULAR | Status: AC | PRN
  Administered 2015-02-04: 25 mL via ORAL

## 2015-02-04 MED ORDER — IOHEXOL 300 MG/ML  SOLN
100.0000 mL | Freq: Once | INTRAMUSCULAR | Status: AC | PRN
  Administered 2015-02-04: 100 mL via INTRAVENOUS

## 2015-02-04 MED ORDER — HYDROMORPHONE HCL 1 MG/ML IJ SOLN
1.0000 mg | Freq: Once | INTRAMUSCULAR | Status: DC
  Filled 2015-02-04: qty 1

## 2015-02-04 MED ORDER — METOCLOPRAMIDE HCL 5 MG/ML IJ SOLN
10.0000 mg | Freq: Once | INTRAMUSCULAR | Status: DC
  Filled 2015-02-04: qty 2

## 2015-02-04 MED ORDER — HYDROMORPHONE HCL 1 MG/ML IJ SOLN
1.0000 mg | Freq: Once | INTRAMUSCULAR | Status: AC
  Administered 2015-02-04: 1 mg via INTRAVENOUS
  Filled 2015-02-04: qty 1

## 2015-02-04 MED ORDER — ONDANSETRON HCL 4 MG/2ML IJ SOLN
4.0000 mg | Freq: Once | INTRAMUSCULAR | Status: AC | PRN
  Administered 2015-02-04: 4 mg via INTRAVENOUS
  Filled 2015-02-04: qty 2

## 2015-02-04 MED ORDER — DICYCLOMINE HCL 20 MG PO TABS: 20.0000 mg | ORAL_TABLET | Freq: Three times a day (TID) | ORAL | Status: DC | PRN

## 2015-02-04 MED ORDER — CIPROFLOXACIN HCL 500 MG PO TABS: 500.0000 mg | ORAL_TABLET | Freq: Two times a day (BID) | ORAL | Status: AC

## 2015-02-04 MED ORDER — HYDROMORPHONE HCL 1 MG/ML IJ SOLN
1.0000 mg | Freq: Once | INTRAMUSCULAR | Status: AC
  Administered 2015-02-04: 1 mg via INTRAMUSCULAR

## 2015-02-04 MED ORDER — METOCLOPRAMIDE HCL 5 MG/ML IJ SOLN
10.0000 mg | Freq: Once | INTRAMUSCULAR | Status: AC
  Administered 2015-02-04: 10 mg via INTRAMUSCULAR

## 2015-02-04 MED ORDER — ONDANSETRON 4 MG PO TBDP
ORAL_TABLET | ORAL | Status: AC
  Administered 2015-02-04: 4 mg via ORAL
  Filled 2015-02-04: qty 1

## 2015-02-04 MED ORDER — LORAZEPAM 2 MG/ML IJ SOLN
1.0000 mg | Freq: Once | INTRAMUSCULAR | Status: AC
  Administered 2015-02-04: 1 mg via INTRAMUSCULAR

## 2015-02-04 MED ORDER — LORAZEPAM 2 MG/ML IJ SOLN
1.0000 mg | Freq: Once | INTRAMUSCULAR | Status: DC
Start: 2015-02-04 — End: 2015-02-04
  Filled 2015-02-04: qty 1

## 2015-02-04 MED ORDER — ONDANSETRON HCL 4 MG/2ML IJ SOLN
4.0000 mg | Freq: Once | INTRAMUSCULAR | Status: AC
  Administered 2015-02-04: 4 mg via INTRAVENOUS
  Filled 2015-02-04: qty 2

## 2015-02-04 MED ORDER — DEXTROSE 5 % IV SOLN
1.0000 g | Freq: Once | INTRAVENOUS | Status: AC
  Administered 2015-02-04: 1 g via INTRAVENOUS
  Filled 2015-02-04: qty 10

## 2015-02-04 MED ORDER — ONDANSETRON 4 MG PO TBDP
4.0000 mg | ORAL_TABLET | Freq: Once | ORAL | Status: AC
  Administered 2015-02-04: 4 mg via ORAL

## 2015-02-04 NOTE — Discharge Instructions (Signed)
Colitis Colitis is inflammation of the colon. Colitis can be a short-term or long-standing (chronic) illness. Crohn's disease and ulcerative colitis are 2 types of colitis which are chronic. They usually require lifelong treatment. CAUSES  There are many different causes of colitis, including:  Viruses.  Germs (bacteria).  Medicine reactions. SYMPTOMS   Diarrhea.  Intestinal bleeding.  Pain.  Fever.  Throwing up (vomiting).  Tiredness (fatigue).  Weight loss.  Bowel blockage. DIAGNOSIS  The diagnosis of colitis is based on examination and stool or blood tests. X-rays, CT scan, and colonoscopy may also be needed. TREATMENT  Treatment may include:  Fluids given through the vein (intravenously).  Bowel rest (nothing to eat or drink for a period of time).  Medicine for pain and diarrhea.  Medicines (antibiotics) that kill germs.  Cortisone medicines.  Surgery. HOME CARE INSTRUCTIONS   Get plenty of rest.  Drink enough water and fluids to keep your urine clear or pale yellow.  Eat a well-balanced diet.  Call your caregiver for follow-up as recommended. SEEK IMMEDIATE MEDICAL CARE IF:   You develop chills.  You have an oral temperature above 102 F (38.9 C), not controlled by medicine.  You have extreme weakness, fainting, or dehydration.  You have repeated vomiting.  You develop severe belly (abdominal) pain or are passing bloody or tarry stools. MAKE SURE YOU:   Understand these instructions.  Will watch your condition.  Will get help right away if you are not doing well or get worse. Document Released: 07/17/2004 Document Revised: 09/01/2011 Document Reviewed: 10/12/2009 [facility] Patient Information 2015 [place], [place]. This information is not intended to replace advice given to you by your health care provider. Make sure you discuss any questions you have with your health care provider.  Urinary Tract Infection Urinary tract infections (UTIs)  can develop anywhere along your urinary tract. Your urinary tract is your body's drainage system for removing wastes and extra water. Your urinary tract includes two kidneys, two ureters, a bladder, and a urethra. Your kidneys are a pair of bean-shaped organs. Each kidney is about the size of your fist. They are located below your ribs, one on each side of your spine. CAUSES Infections are caused by microbes, which are microscopic organisms, including fungi, viruses, and bacteria. These organisms are so small that they can only be seen through a microscope. Bacteria are the microbes that most commonly cause UTIs. SYMPTOMS  Symptoms of UTIs may vary by age and gender of the patient and by the location of the infection. Symptoms in young women typically include a frequent and intense urge to urinate and a painful, burning feeling in the bladder or urethra during urination. Older women and men are more likely to be tired, shaky, and weak and have muscle aches and abdominal pain. A fever may mean the infection is in your kidneys. Other symptoms of a kidney infection include pain in your back or sides below the ribs, nausea, and vomiting. DIAGNOSIS To diagnose a UTI, your caregiver will ask you about your symptoms. Your caregiver also will ask to provide a urine sample. The urine sample will be tested for bacteria and white blood cells. White blood cells are made by your body to help fight infection. TREATMENT  Typically, UTIs can be treated with medication. Because most UTIs are caused by a bacterial infection, they usually can be treated with the use of antibiotics. The choice of antibiotic and length of treatment depend on your symptoms and the type of bacteria  causing your infection. HOME CARE INSTRUCTIONS  If you were prescribed antibiotics, take them exactly as your caregiver instructs you. Finish the medication even if you feel better after you have only taken some of the medication.  Drink enough  water and fluids to keep your urine clear or pale yellow.  Avoid caffeine, tea, and carbonated beverages. They tend to irritate your bladder.  Empty your bladder often. Avoid holding urine for long periods of time.  Empty your bladder before and after sexual intercourse.  After a bowel movement, women should cleanse from front to back. Use each tissue only once. SEEK MEDICAL CARE IF:   You have back pain.  You develop a fever.  Your symptoms do not begin to resolve within 3 days. SEEK IMMEDIATE MEDICAL CARE IF:   You have severe back pain or lower abdominal pain.  You develop chills.  You have nausea or vomiting.  You have continued burning or discomfort with urination. MAKE SURE YOU:   Understand these instructions.  Will watch your condition.  Will get help right away if you are not doing well or get worse. Document Released: 03/19/2005 Document Revised: 12/09/2011 Document Reviewed: 07/18/2011 [facility] Patient Information 2015 [place], [place]. This information is not intended to replace advice given to you by your health care provider. Make sure you discuss any questions you have with your health care provider.  Please return immediately if condition worsens. Please contact her primary physician or the physician you were given for referral. If you have any specialist physicians involved in her treatment and plan please also contact them. Thank you for using [facility].

## 2015-02-04 NOTE — ED Notes (Signed)
Pt reports multiple episodes of vomiting today, reports also pain from umbilicus that radiates up to epigastrum. Pt tearful in triage room.

## 2015-02-04 NOTE — ED Notes (Signed)
Several attempts made by two RNs for IV access and unable to obtain access. MD aware.

## 2015-02-04 NOTE — ED Provider Notes (Signed)
Time Seen: Approximately ----------------------------------------- 3:42 PM on 02/04/2015 -----------------------------------------    I have reviewed the triage notes  Chief Complaint: Emesis and Abdominal Pain   History of Present Illness: Lynn Larson is a 49 y.o. female who presents with repetitive episodes of persistent nausea vomiting and loose stool. Based on the record this apparently is been worked up before in the past and seems to be chronic in nature. Patient was here on August 2 and extensive evaluation at that time and was treated on an outpatient basis. She states she does have a gastroenterologist and [facility]. Patient states the nausea vomiting and loose stools started again today. She states she's had some chills but no objective fever at home. She denies any hematemesis or biliary emesis. She points mainly to the umbilicus area as the source of her discomfort. She denies any melena or hematochezia. She denies any back or flank pain. She denies any dysuria or hematuria. She states this seems to be typical of her previous episodes in the past.   Past Medical History  Diagnosis Date  . Hypertension     There are no active problems to display for this patient.   Past Surgical History  Procedure Laterality Date  . Cholecystectomy      Past Surgical History  Procedure Laterality Date  . Cholecystectomy      Current Outpatient Rx  Name  Route  Sig  Dispense  Refill  . albuterol (PROVENTIL HFA;VENTOLIN HFA) 108 (90 BASE) MCG/ACT inhaler   Inhalation   Inhale 2 puffs into the lungs every 6 (six) hours as needed for wheezing or shortness of breath.         . metoCLOPramide (REGLAN) 10 MG tablet   Oral   Take 1 tablet (10 mg total) by mouth 3 (three) times daily with meals.   15 tablet   0     Allergies:  Morphine and related  Family History: No family history on file.  Social History: Social History   Substance Use Topics  . Smoking status: Current Every Day Smoker -- 0.50 packs/day    Types: Cigarettes  . Smokeless tobacco: None  . Alcohol Use: No     Review of Systems:   10 point review of systems was performed and was otherwise negative:  Constitutional: No fever Eyes: No visual disturbances ENT: No sore throat, ear pain Cardiac: No chest pain Respiratory: No shortness of breath, wheezing, or stridor Abdomen: Pain is around the umbilicus radiating up toward the epigastric region. Endocrine: No weight loss, No night sweats Extremities: No peripheral edema, cyanosis Skin: No rashes, easy bruising Neurologic: No focal weakness, trouble with speech or swollowing Urologic: No dysuria, Hematuria, or urinary frequency Patient is very anxious.  Physical Exam:  ED Triage Vitals  Enc Vitals Group     BP 02/04/15 1423 168/101 mmHg     Pulse Rate 02/04/15 1423 110     Resp 02/04/15 1423 24     Temp 02/04/15 1423 97.9 F (36.6 C)     Temp Source 02/04/15 1423 Oral     SpO2 02/04/15 1423 96 %     Weight 02/04/15 1423 167 lb (75.751 kg)     Height 02/04/15 1423 5\' 3"  (1.6 m)     Head Cir --      Peak Flow --      Pain Score 02/04/15 1428 10     Pain Loc --  Pain Edu? --      Excl. in GC? --     General: Awake , Alert , and Oriented times 3; GCS 15 Head: Normal cephalic , atraumatic Eyes: Pupils equal , round, reactive to light Nose/Throat: No nasal drainage, patent upper airway without erythema or exudate.  Neck: Supple, Full range of motion, No anterior adenopathy or palpable thyroid masses Lungs: Clear to ascultation without wheezes , rhonchi, or rales Heart: Regular rate, regular rhythm without murmurs , gallops , or rubs Abdomen: Soft, non tender without rebound, guarding , or rigidity; bowel sounds positive and symmetric in all 4 quadrants. No organomegaly .        Extremities: 2 plus symmetric pulses. No edema, clubbing or cyanosis Neurologic: normal  ambulation, Motor symmetric without deficits, sensory intact Skin: warm, dry, no rashes   Labs:   All laboratory work was reviewed including any pertinent negatives or positives listed below:  Labs Reviewed  LIPASE, BLOOD  COMPREHENSIVE METABOLIC PANEL  CBC  URINALYSIS COMPLETEWITH MICROSCOPIC ([facility] ONLY)   laboratory work showed a significantly elevated white blood cell count. She also appears to have a urinary tract infection  EKG: None   Radiology:  I personally reviewed the radiologic studies   TECHNIQUE: Multidetector CT imaging of the abdomen and pelvis was performed using the standard protocol following bolus administration of intravenous contrast.  CONTRAST: OMNIPAQUE IOHEXOL 300 MG/ML SOLN  COMPARISON: 10/22/2013  FINDINGS: BODY WALL: No contributory findings.  LOWER CHEST: No contributory findings.  ABDOMEN/PELVIS:  Liver: No focal abnormality.  Biliary: Cholecystectomy with chronic mild intrahepatic and extrahepatic bile duct enlargement. Noted negative biliary labs.  Pancreas: Unremarkable.  Spleen: Unremarkable.  Adrenals: Unremarkable.  Kidneys and ureters: No hydronephrosis or stone. Bilateral sub cm cortical low densities are too small to characterize.  Bladder: Unremarkable.  Reproductive: No pathologic findings.  Bowel: Apparent colonic wall thickening is best explained by underdistention. Mild distal colonic diverticulosis. No bowel obstruction. No appendicitis.  Retroperitoneum: No mass or adenopathy.  Peritoneum: No ascites or pneumoperitoneum.  Vascular: No acute abnormality. Aortic and iliac atherosclerosis  OSSEOUS: No acute abnormalities.   Critical Care: None    ED Course:  Patient's stay showed some gradual symptomatic improvement and the patient was given IV Rocephin along with fluid bolus and anti-medic therapy. They started her record and her clinical presentation I felt most likely this was some form of  colitis or gastroenteritis with associated urinary tract infection. She does not have unilateral flank pain indicative of pyelonephritis though she'll be were given the IV Rocephin and discharged on ciprofloxacin. Patient was able tolerate oral food and fluid intake and was discharged with a friend. She was given prescriptions for Phenergan tablets, suppositories, Cipro, and Bentyl. [facility] Repeat abdominal exam showed no reproducible peritoneal signs Differential diagnosis includes but is not exclusive to ovarian cyst, ovarian torsion, acute appendicitis, urinary tract infection, endometriosis, bowel obstruction, colitis, renal colic, gastroenteritis, etc.    Assessment: Gastroenteritis Urinary tract infection   Final Clinical Impression: Colitis  Final diagnoses:  None     Plan: Patient was advised to return immediately if condition worsens. Patient was advised to follow up with her primary care physician or other specialized physicians involved and in their current assessment.             Jennye Moccasin, MD 02/05/15 [phone]

## 2015-02-04 NOTE — ED Notes (Signed)
Patient transported to CT 

## 2015-02-04 NOTE — ED Notes (Signed)
Unable to obtain blood at this time. 

## 2015-02-05 NOTE — ED Notes (Signed)
Pt. Has ride waiting for her in waiting room. 

## 2015-02-15 ENCOUNTER — Observation Stay
Admission: EM | Admit: 2015-02-15 | Discharge: 2015-02-18 | Disposition: A | Discharge status: Home / Self Care | Payer: Self-pay | Attending: Internal Medicine | Admitting: Internal Medicine

## 2015-02-15 ENCOUNTER — Emergency Department: Payer: Self-pay

## 2015-02-15 ENCOUNTER — Encounter: Payer: Self-pay | Admitting: *Deleted

## 2015-02-15 DIAGNOSIS — D72829 Elevated white blood cell count, unspecified: Secondary | ICD-10-CM | POA: Insufficient documentation

## 2015-02-15 DIAGNOSIS — K2900 Acute gastritis without bleeding: Secondary | ICD-10-CM | POA: Insufficient documentation

## 2015-02-15 DIAGNOSIS — F17210 Nicotine dependence, cigarettes, uncomplicated: Secondary | ICD-10-CM | POA: Insufficient documentation

## 2015-02-15 DIAGNOSIS — R112 Nausea with vomiting, unspecified: Secondary | ICD-10-CM | POA: Insufficient documentation

## 2015-02-15 DIAGNOSIS — E876 Hypokalemia: Secondary | ICD-10-CM | POA: Insufficient documentation

## 2015-02-15 DIAGNOSIS — Z9049 Acquired absence of other specified parts of digestive tract: Secondary | ICD-10-CM | POA: Insufficient documentation

## 2015-02-15 DIAGNOSIS — J45909 Unspecified asthma, uncomplicated: Secondary | ICD-10-CM | POA: Insufficient documentation

## 2015-02-15 DIAGNOSIS — R1013 Epigastric pain: Secondary | ICD-10-CM | POA: Insufficient documentation

## 2015-02-15 DIAGNOSIS — I708 Atherosclerosis of other arteries: Secondary | ICD-10-CM | POA: Insufficient documentation

## 2015-02-15 DIAGNOSIS — R197 Diarrhea, unspecified: Principal | ICD-10-CM | POA: Insufficient documentation

## 2015-02-15 DIAGNOSIS — Z79899 Other long term (current) drug therapy: Secondary | ICD-10-CM | POA: Insufficient documentation

## 2015-02-15 DIAGNOSIS — R109 Unspecified abdominal pain: Secondary | ICD-10-CM

## 2015-02-15 DIAGNOSIS — F419 Anxiety disorder, unspecified: Secondary | ICD-10-CM | POA: Insufficient documentation

## 2015-02-15 DIAGNOSIS — I10 Essential (primary) hypertension: Secondary | ICD-10-CM | POA: Insufficient documentation

## 2015-02-15 DIAGNOSIS — R1114 Bilious vomiting: Secondary | ICD-10-CM | POA: Insufficient documentation

## 2015-02-15 DIAGNOSIS — J449 Chronic obstructive pulmonary disease, unspecified: Secondary | ICD-10-CM | POA: Insufficient documentation

## 2015-02-15 LAB — COMPREHENSIVE METABOLIC PANEL
ALBUMIN: 4.4 g/dL (ref 3.5–5.0)
ALK PHOS: 85 U/L (ref 38–126)
ALT: 15 U/L (ref 14–54)
AST: 18 U/L (ref 15–41)
Anion gap: 10 (ref 5–15)
BILIRUBIN TOTAL: 0.5 mg/dL (ref 0.3–1.2)
BUN: 14 mg/dL (ref 6–20)
CALCIUM: 9 mg/dL (ref 8.9–10.3)
CO2: 23 mmol/L (ref 22–32)
CREATININE: 0.76 mg/dL (ref 0.44–1.00)
Chloride: 110 mmol/L (ref 101–111)
GFR calc Af Amer: >60 mL/min (ref >60)
GLUCOSE: 117 mg/dL — AB (ref 65–99)
POTASSIUM: 3.3 mmol/L — AB (ref 3.5–5.1)
Sodium: 143 mmol/L (ref 135–145)
TOTAL PROTEIN: 7.6 g/dL (ref 6.5–8.1)

## 2015-02-15 LAB — CBC WITH DIFFERENTIAL/PLATELET
BASOS ABS: 0.1 10*3/uL (ref 0–0.1)
BASOS PCT: 1 %
Eosinophils Absolute: 0.2 10*3/uL (ref 0–0.7)
Eosinophils Relative: 2 %
HEMATOCRIT: 41.8 % (ref 35.0–47.0)
HEMOGLOBIN: 13.9 g/dL (ref 12.0–16.0)
LYMPHS PCT: 14 %
Lymphs Abs: 1.9 10*3/uL (ref 1.0–3.6)
MCH: 28.6 pg (ref 26.0–34.0)
MCHC: 33.3 g/dL (ref 32.0–36.0)
MCV: 85.8 fL (ref 80.0–100.0)
MONO ABS: 0.9 10*3/uL (ref 0.2–0.9)
Monocytes Relative: 6 %
NEUTROS ABS: 10.7 10*3/uL — AB (ref 1.4–6.5)
NEUTROS PCT: 77 %
Platelets: 353 10*3/uL (ref 150–440)
RBC: 4.87 MIL/uL (ref 3.80–5.20)
RDW: 14.6 % — ABNORMAL HIGH (ref 11.5–14.5)
WBC: 13.8 10*3/uL — ABNORMAL HIGH (ref 3.6–11.0)

## 2015-02-15 LAB — TROPONIN I: Troponin I: <0.03 ng/mL (ref <0.031)

## 2015-02-15 LAB — LIPASE, BLOOD: LIPASE: 16 U/L — AB (ref 22–51)

## 2015-02-15 MED ORDER — SODIUM CHLORIDE 0.9 % IV BOLUS (SEPSIS)
1000.0000 mL | Freq: Once | INTRAVENOUS | Status: AC
  Administered 2015-02-15: 1000 mL via INTRAVENOUS

## 2015-02-15 MED ORDER — METOCLOPRAMIDE HCL 10 MG PO TABS
10.0000 mg | ORAL_TABLET | Freq: Three times a day (TID) | ORAL | Status: DC
  Administered 2015-02-16 (×2): 10 mg via ORAL
  Filled 2015-02-15 (×2): qty 1

## 2015-02-15 MED ORDER — PANTOPRAZOLE SODIUM 40 MG IV SOLR
40.0000 mg | Freq: Two times a day (BID) | INTRAVENOUS | Status: DC
  Administered 2015-02-15 – 2015-02-18 (×6): 40 mg via INTRAVENOUS
  Filled 2015-02-15 (×6): qty 40

## 2015-02-15 MED ORDER — ENOXAPARIN SODIUM 40 MG/0.4ML ~~LOC~~ SOLN
40.0000 mg | SUBCUTANEOUS | Status: DC
  Administered 2015-02-16 – 2015-02-17 (×2): 40 mg via SUBCUTANEOUS
  Filled 2015-02-15 (×2): qty 0.4

## 2015-02-15 MED ORDER — FENTANYL CITRATE (PF) 100 MCG/2ML IJ SOLN
100.0000 ug | Freq: Once | INTRAMUSCULAR | Status: AC
  Administered 2015-02-15: 100 ug via INTRAVENOUS
  Filled 2015-02-15: qty 2

## 2015-02-15 MED ORDER — DICYCLOMINE HCL 20 MG PO TABS
20.0000 mg | ORAL_TABLET | Freq: Three times a day (TID) | ORAL | Status: DC | PRN
Start: 2015-02-15 — End: 2015-02-18

## 2015-02-15 MED ORDER — ACETAMINOPHEN 650 MG RE SUPP: 650.0000 mg | Freq: Four times a day (QID) | RECTAL | Status: DC | PRN

## 2015-02-15 MED ORDER — PNEUMOCOCCAL VAC POLYVALENT 25 MCG/0.5ML IJ INJ: 0.5000 mL | INJECTION | INTRAMUSCULAR | Status: DC

## 2015-02-15 MED ORDER — ACETAMINOPHEN 325 MG PO TABS
650.0000 mg | ORAL_TABLET | Freq: Four times a day (QID) | ORAL | Status: DC | PRN
  Administered 2015-02-18: 650 mg via ORAL
  Filled 2015-02-15: qty 2

## 2015-02-15 MED ORDER — SODIUM CHLORIDE 0.9 % IV SOLN
80.0000 mg | Freq: Once | INTRAVENOUS | Status: AC
  Administered 2015-02-15: 80 mg via INTRAVENOUS

## 2015-02-15 MED ORDER — METOCLOPRAMIDE HCL 5 MG/ML IJ SOLN
10.0000 mg | Freq: Once | INTRAMUSCULAR | Status: AC
  Administered 2015-02-15: 10 mg via INTRAVENOUS
  Filled 2015-02-15: qty 2

## 2015-02-15 MED ORDER — HYDROMORPHONE HCL 1 MG/ML IJ SOLN
1.0000 mg | INTRAMUSCULAR | Status: DC | PRN
  Administered 2015-02-15 – 2015-02-18 (×14): 1 mg via INTRAVENOUS
  Filled 2015-02-15 (×14): qty 1

## 2015-02-15 MED ORDER — POTASSIUM CHLORIDE IN NACL 40-0.9 MEQ/L-% IV SOLN
INTRAVENOUS | Status: DC
  Administered 2015-02-15 – 2015-02-17 (×4): 75 mL/h via INTRAVENOUS
  Administered 2015-02-18: 50 mL/h via INTRAVENOUS
  Filled 2015-02-15 (×6): qty 1000

## 2015-02-15 MED ORDER — PROMETHAZINE HCL 25 MG/ML IJ SOLN
12.5000 mg | Freq: Once | INTRAMUSCULAR | Status: AC
  Administered 2015-02-15: 12.5 mg via INTRAVENOUS
  Filled 2015-02-15: qty 1

## 2015-02-15 MED ORDER — ONDANSETRON HCL 4 MG/2ML IJ SOLN
4.0000 mg | Freq: Four times a day (QID) | INTRAMUSCULAR | Status: DC | PRN
  Administered 2015-02-15 – 2015-02-18 (×4): 4 mg via INTRAVENOUS
  Filled 2015-02-15 (×5): qty 2

## 2015-02-15 MED ORDER — PANTOPRAZOLE SODIUM 40 MG IV SOLR
INTRAVENOUS | Status: AC
  Filled 2015-02-15: qty 40

## 2015-02-15 MED ORDER — SUCRALFATE 1 G PO TABS
1.0000 g | ORAL_TABLET | Freq: Once | ORAL | Status: AC
  Administered 2015-02-15: 1 g via ORAL
  Filled 2015-02-15: qty 1

## 2015-02-15 MED ORDER — ALBUTEROL SULFATE (2.5 MG/3ML) 0.083% IN NEBU: 3.0000 mL | INHALATION_SOLUTION | Freq: Four times a day (QID) | RESPIRATORY_TRACT | Status: DC | PRN

## 2015-02-15 MED ORDER — PANTOPRAZOLE SODIUM 40 MG IV SOLR
40.0000 mg | Freq: Once | INTRAVENOUS | Status: DC
  Filled 2015-02-15: qty 40

## 2015-02-15 NOTE — ED Notes (Signed)
Pt still with c/o of nausea, zofran given.

## 2015-02-15 NOTE — ED Notes (Signed)
Handoff received from [facility].

## 2015-02-15 NOTE — ED Notes (Signed)
Pt stated prn meds have not helped, pt still with complaint of nausea.

## 2015-02-15 NOTE — ED Provider Notes (Signed)
[facility] Emergency Department Provider Note  ____________________________________________  Time seen: Seen upon arrival to the emergency department  I have reviewed the triage vital signs and the nursing notes.   HISTORY  Chief Complaint Abdominal Pain    HPI Lynn Larson is a 49 y.o. female with a history of esophagitis and gastritis who is presenting today with intermittent abdominal pain which started this morning. The pain is associated with nausea and vomiting. The vomit had been clear but the patient then had an episode of bilious vomiting for EMS on the way to the hospital. She is also reporting 6 episodes of diarrhea earlier today. No blood in her stool. Had one dose of antibiotics during her last visit to this hospital and was discharged with diagnosis of colitis but was not able to fill her prescriptions. The patient does not drink. She's had multiple similar episodes in the past. Says the pain was not sudden onset but gradual. Her abdominal pain is epigastric and nonradiating.   Past Medical History  Diagnosis Date  . Hypertension   . Asthma     There are no active problems to display for this patient.   Past Surgical History  Procedure Laterality Date  . Cholecystectomy      Current Outpatient Rx  Name  Route  Sig  Dispense  Refill  . albuterol (PROVENTIL HFA;VENTOLIN HFA) 108 (90 BASE) MCG/ACT inhaler   Inhalation   Inhale 2 puffs into the lungs every 6 (six) hours as needed for wheezing or shortness of breath.         . dicyclomine (BENTYL) 20 MG tablet   Oral   Take 1 tablet (20 mg total) by mouth 3 (three) times daily as needed for spasms.   30 tablet   0   . metoCLOPramide (REGLAN) 10 MG tablet   Oral   Take 1 tablet (10 mg total) by mouth 3 (three) times daily with meals.   15 tablet   0   . promethazine (PHENERGAN) 12.5 MG suppository   Rectal   Place 1 suppository (12.5 mg total) rectally every 6 (six) hours as  needed for nausea or vomiting.   12 each   0   . promethazine (PHENERGAN) 12.5 MG tablet   Oral   Take 1 tablet (12.5 mg total) by mouth every 6 (six) hours as needed for nausea or vomiting.   30 tablet   0     Allergies Morphine and related  History reviewed. No pertinent family history.  Social History Social History  Substance Use Topics  . Smoking status: Current Every Day Smoker -- 0.50 packs/day    Types: Cigarettes  . Smokeless tobacco: None  . Alcohol Use: No    Review of Systems Constitutional: No fever/chills Eyes: No visual changes. ENT: No sore throat. Cardiovascular: Denies chest pain. Respiratory: Denies shortness of breath. Gastrointestinal: No constipation. Genitourinary: Negative for dysuria. Musculoskeletal: Negative for back pain. Skin: Negative for rash. Neurological: Negative for headaches, focal weakness or numbness.  10-point ROS otherwise negative.  ____________________________________________   PHYSICAL EXAM:  VITAL SIGNS: ED Triage Vitals  Enc Vitals Group     BP 02/15/15 1821 141/107 mmHg     Pulse Rate 02/15/15 1821 87     Resp 02/15/15 1821 16     Temp 02/15/15 1821 98 F (36.7 C)     Temp Source 02/15/15 1821 Oral     SpO2 02/15/15 1821 98 %     Weight 02/15/15 1821  167 lb (75.751 kg)     Height 02/15/15 1821 5\' 2"  (1.575 m)     Head Cir --      Peak Flow --      Pain Score --      Pain Loc --      Pain Edu? --      Excl. in GC? --     Constitutional: Alert and oriented. Patient appears ill and in distress. Vomited in the exam room with blood streaks. Eyes: Conjunctivae are normal. PERRL. EOMI. Head: Atraumatic. Nose: No congestion/rhinnorhea. Mouth/Throat: Mucous membranes are moist.  Oropharynx non-erythematous. Neck: No stridor.   Cardiovascular: Normal rate, regular rhythm. Grossly normal heart sounds.  Good peripheral circulation. Respiratory: Normal respiratory effort.  No retractions. Lungs  CTAB. Gastrointestinal: Soft without rebound.  Exquisite tenderness to the epigastrium. Negative Murphy sign. No tenderness over McBurney's point. No distention. No abdominal bruits. No CVA tenderness. Musculoskeletal: No lower extremity tenderness nor edema.  No joint effusions. Neurologic:  Normal speech and language. No gross focal neurologic deficits are appreciated. No gait instability. Skin:  Skin is warm, dry and intact. No rash noted. Psychiatric: Mood and affect are normal. Speech and behavior are normal.  ____________________________________________   LABS (all labs ordered are listed, but only abnormal results are displayed)  Labs Reviewed  CBC WITH DIFFERENTIAL/PLATELET - Abnormal; Notable for the following:    WBC 13.8 (*)    RDW 14.6 (*)    Neutro Abs 10.7 (*)    All other components within normal limits  COMPREHENSIVE METABOLIC PANEL - Abnormal; Notable for the following:    Potassium 3.3 (*)    Glucose, Bld 117 (*)    All other components within normal limits  LIPASE, BLOOD - Abnormal; Notable for the following:    Lipase 16 (*)    All other components within normal limits  TROPONIN I  URINALYSIS COMPLETEWITH MICROSCOPIC ([facility] ONLY)  POC URINE PREG, ED   ____________________________________________  EKG  ED ECG REPORT I, Arelia Longest, the attending physician, personally viewed and interpreted this ECG.   Date: 02/15/2015  EKG Time: 1848  Rate: 65  Rhythm: Normal sinus rhythm with sinus arrhythmia  Axis: Normal axis  Intervals:none  ST&T Change: No ST elevations or depressions. No abnormal T-wave inversions.  ____________________________________________  RADIOLOGY  Stable minimal chronic bronchitic changes without any acute abnormality. I personally reviewed these images. ____________________________________________   PROCEDURES    ____________________________________________   INITIAL IMPRESSION / ASSESSMENT AND PLAN / ED  COURSE  Pertinent labs & imaging results that were available during my care of the patient were reviewed by me and considered in my medical decision making (see chart for details). ----------------------------------------- 8:07 PM on 02/15/2015 -----------------------------------------  Patient still with several episodes of vomiting despite Phenergan. Given a dose of Reglan and also an additional dose of fentanyl. Resting comfortably when I entered the room but when awoken patient still with epigastric abdominal pain. Likely severe gastritis. We'll admit to the hospital for further medication. Signed out to Dr. Pablo Lawrence.   ____________________________________________   FINAL CLINICAL IMPRESSION(S) / ED DIAGNOSES  Acute abdominal pain with nausea, vomiting and diarrhea. Return visit.    Myrna Blazer, MD 02/15/15 2008

## 2015-02-15 NOTE — ED Notes (Signed)
Report given to [employer], all questions answered.

## 2015-02-15 NOTE — ED Notes (Addendum)
Carafate held at this time due to pt actively vomiting. Pts vomit continues to have streaks of bright red blood. MD notified.

## 2015-02-15 NOTE — ED Notes (Signed)
Update given to [employer].

## 2015-02-15 NOTE — ED Notes (Signed)
Pt tearful with family on the phone, telling family she will be staying overnight in the hospital for 24 hours.

## 2015-02-15 NOTE — ED Notes (Signed)
Pt is quite with eyes closed when nurse walks in room. When asked about pain and nausea pt reports it is beginning to "come back" pt then begins to moan in pain again and move around in the bed. MD notified of pts reported nausea and pain.

## 2015-02-15 NOTE — ED Notes (Signed)
Pt arrived to ED via EMS from home reporting abd pain and NV beginning this am. Pt reports having gone to work and nausea and pain increased and in no unbearable. Pt arrives verbally moaning and asking for help from staff. Pt can not sit still in bed and reports RUQ pain feels as though someone is stabbing her. Pt has hx of gallbladder removal and stomach ulcers. Pt seen in Ed 1 week ago and reports being sent home with antibiotics that she was unable to fill or take. Pt also has HTN upon arrival and reports not having taken blood pressure medication for the past 3 weeks.

## 2015-02-15 NOTE — H&P (Signed)
[facility] - [place] at [facility]   PATIENT NAME: Lynn Larson    MR#:  [id number]  DATE OF BIRTH:  11/17/1965  DATE OF ADMISSION:  02/15/2015  PRIMARY CARE PHYSICIAN: No primary care provider on file.   REQUESTING/REFERRING PHYSICIAN: Dr. Langston Masker  CHIEF COMPLAINT:   Chief Complaint  Patient presents with  . Abdominal Pain    HISTORY OF PRESENT ILLNESS:  Lynn Larson  is a 49 y.o. female with no medical problems comes in because of nausea vomiting abdominal pain. Patient is known to our service secondary to multiple admissions with the same complaints of nausea vomiting abdominal pain. Patient was here in the emergency room this month 3 times for the same problem. She had abdominal CAT scan on now August 16 which is unremarkable. Comes in today because of significant midepigastric pain and the multiple episodes of intractable nausea and vomiting. Received fentanyl in the emergency room,protonix,phnergan. Complains of epigastric pain around 8 out of 10 severity not radiating radiating in type. No diarrhea.  PAST MEDICAL HISTORY:   Past Medical History  Diagnosis Date  . Hypertension   . Asthma     PAST SURGICAL HISTOIRY:   Past Surgical History  Procedure Laterality Date  . Cholecystectomy      SOCIAL HISTORY:   Social History  Substance Use Topics  . Smoking status: Current Every Day Smoker -- 0.50 packs/day    Types: Cigarettes  . Smokeless tobacco: Not on file  . Alcohol Use: No    FAMILY HISTORY:  History reviewed. No pertinent family history.  DRUG ALLERGIES:   Allergies  Allergen Reactions  . Morphine And Related     Cherry face    REVIEW OF SYSTEMS:  CONSTITUTIONAL: No fever, fatigue or weakness.  EYES: No blurred or double vision.  EARS, NOSE, AND THROAT: No tinnitus or ear pain.  RESPIRATORY: No cough, shortness of breath, wheezing or hemoptysis.  CARDIOVASCULAR: No chest pain, orthopnea, edema.  GASTROINTESTINAL:  Nausea, vomiting, abdominal pain.  GENITOURINARY: No dysuria, hematuria.  ENDOCRINE: No polyuria, nocturia,  HEMATOLOGY: No anemia, easy bruising or bleeding SKIN: No rash or lesion. MUSCULOSKELETAL: No joint pain or arthritis.   NEUROLOGIC: No tingling, numbness, weakness.  PSYCHIATRY: No anxiety or depression.   MEDICATIONS AT HOME:   Prior to Admission medications   Medication Sig Start Date End Date Taking? Authorizing Provider  albuterol (PROVENTIL HFA;VENTOLIN HFA) 108 (90 BASE) MCG/ACT inhaler Inhale 2 puffs into the lungs every 6 (six) hours as needed for wheezing or shortness of breath.    Historical Provider, MD  dicyclomine (BENTYL) 20 MG tablet Take 1 tablet (20 mg total) by mouth 3 (three) times daily as needed for spasms. 02/04/15   Jennye Moccasin, MD  metoCLOPramide (REGLAN) 10 MG tablet Take 1 tablet (10 mg total) by mouth 3 (three) times daily with meals. 01/23/15 01/23/16  Rebecka Apley, MD  promethazine (PHENERGAN) 12.5 MG suppository Place 1 suppository (12.5 mg total) rectally every 6 (six) hours as needed for nausea or vomiting. 02/04/15   Jennye Moccasin, MD  promethazine (PHENERGAN) 12.5 MG tablet Take 1 tablet (12.5 mg total) by mouth every 6 (six) hours as needed for nausea or vomiting. 02/04/15   Jennye Moccasin, MD      VITAL SIGNS:  Blood pressure 141/107, pulse 87, temperature 98 F (36.7 C), temperature source Oral, resp. rate 16, height  (1.575 m), weight 75.751 kg (167 lb), SpO2 99 %.  PHYSICAL EXAMINATION:  GENERAL:  49 y.o.-year-old patient lying in the bed with no acute distress.  EYES: Pupils equal, round, reactive to light and accommodation. No scleral icterus. Extraocular muscles intact.  HEENT: Head atraumatic, normocephalic. Oropharynx and nasopharynx clear.  NECK:  Supple, no jugular venous distention. No thyroid enlargement, no tenderness.  LUNGS: Normal breath sounds bilaterally, no wheezing, rales,rhonchi or crepitation. No use of  accessory muscles of respiration.  CARDIOVASCULAR: S1, S2 normal. No murmurs, rubs, or gallops.  ABDOMEN: I'll epigastric tenderness present no rebound tenderness present no organomegaly. No hernias. EXTREMITIES: No pedal edema, cyanosis, or clubbing.  NEUROLOGIC: Cranial nerves II through XII are intact. Muscle strength 5/5 in all extremities. Sensation intact. Gait not checked.  PSYCHIATRIC: The patient is alert and oriented x 3.  SKIN: No obvious rash, lesion, or ulcer.   LABORATORY PANEL:   CBC  Recent Labs Lab 02/15/15 1924  WBC 13.8*  HGB 13.9  HCT 41.8  PLT 353   ------------------------------------------------------------------------------------------------------------------  Chemistries   Recent Labs Lab 02/15/15 1924  NA 143  K 3.3*  CL 110  CO2 23  GLUCOSE 117*  BUN 14  CREATININE 0.76  CALCIUM 9.0  AST 18  ALT 15  ALKPHOS 85  BILITOT 0.5   ------------------------------------------------------------------------------------------------------------------  Cardiac Enzymes  Recent Labs Lab 02/15/15 1924  TROPONINI <0.03   ------------------------------------------------------------------------------------------------------------------  RADIOLOGY:  Dg Chest 1 View  02/15/2015   CLINICAL DATA:  Persistent nausea, vomiting and loose stools. Smoker.  EXAM: CHEST  1 VIEW  COMPARISON:  10/22/2013.  FINDINGS: Normal sized heart. Clear lungs. Stable minimal diffuse peribronchial thickening. Mild scoliosis.  IMPRESSION: Stable minimal chronic bronchitic changes.  No acute abnormality.   Electronically Signed   By: Beckie Salts M.D.   On: 02/15/2015 18:45    EKG:   Orders placed or performed during the hospital encounter of 02/15/15  . ED EKG  . ED EKG    IMPRESSION AND PLAN:   #1 intractable nausea vomiting and epigastric pain symptoms are consistent with acute gastritis. Continue IV fluids, the PPIs, IV Zofran. Start on clear liquids. Remote history of  gastric ulcers and the adenopathy patient had EGD done long time back in 2009. If patient  Persistently  has nausea vomiting abdominal pain she may need EGD.. #2 hypokalemia replace the potassium and IV fluids. #3 elevated blood pressure without history of hypertension likely secondary to nausea vomiting and abdominal pain. . #4 tobacco abuse consulate is smoking for about .    All the records are reviewed and case discussed with ED provider. Management plans discussed with the patient, family and they are in agreement.  CODE STATUS:full  TOTAL TIME TAKING CARE OF THIS PATIENT: 55 minutes.    Katha Hamming M.D on 02/15/2015 at 8:31 PM  Between 7am to 6pm - Pager - [phone]  After 6pm go to [email or web address] - password [email or web address] [facility]  [place] [facility]  Office  [phone]  CC: Primary care physician; No primary care provider on file.

## 2015-02-16 LAB — URINALYSIS COMPLETE WITH MICROSCOPIC (ARMC ONLY)
BACTERIA UA: NONE SEEN
BILIRUBIN URINE: NEGATIVE
Glucose, UA: NEGATIVE mg/dL
LEUKOCYTES UA: NEGATIVE
NITRITE: NEGATIVE
PH: 5 (ref 5.0–8.0)
Protein, ur: NEGATIVE mg/dL
SPECIFIC GRAVITY, URINE: 1.023 (ref 1.005–1.030)

## 2015-02-16 LAB — CBC
HEMATOCRIT: 39.5 % (ref 35.0–47.0)
HEMOGLOBIN: 13.1 g/dL (ref 12.0–16.0)
MCH: 28.7 pg (ref 26.0–34.0)
MCHC: 33.3 g/dL (ref 32.0–36.0)
MCV: 86.2 fL (ref 80.0–100.0)
Platelets: 335 10*3/uL (ref 150–440)
RBC: 4.58 MIL/uL (ref 3.80–5.20)
RDW: 14.6 % — ABNORMAL HIGH (ref 11.5–14.5)
WBC: 11.3 10*3/uL — AB (ref 3.6–11.0)

## 2015-02-16 LAB — BASIC METABOLIC PANEL
ANION GAP: 4 — AB (ref 5–15)
BUN: 12 mg/dL (ref 6–20)
CHLORIDE: 112 mmol/L — AB (ref 101–111)
CO2: 25 mmol/L (ref 22–32)
Calcium: 8.6 mg/dL — ABNORMAL LOW (ref 8.9–10.3)
Creatinine, Ser: 0.63 mg/dL (ref 0.44–1.00)
GFR calc Af Amer: >60 mL/min (ref >60)
GLUCOSE: 99 mg/dL (ref 65–99)
POTASSIUM: 3.7 mmol/L (ref 3.5–5.1)
SODIUM: 141 mmol/L (ref 135–145)

## 2015-02-16 LAB — URINE DRUG SCREEN, QUALITATIVE (ARMC ONLY)
AMPHETAMINES, UR SCREEN: NOT DETECTED
Barbiturates, Ur Screen: NOT DETECTED
Benzodiazepine, Ur Scrn: NOT DETECTED
COCAINE METABOLITE, UR ~~LOC~~: NOT DETECTED
Cannabinoid 50 Ng, Ur ~~LOC~~: POSITIVE — AB
MDMA (Ecstasy)Ur Screen: NOT DETECTED
METHADONE SCREEN, URINE: NOT DETECTED
OPIATE, UR SCREEN: POSITIVE — AB
Phencyclidine (PCP) Ur S: NOT DETECTED
Tricyclic, Ur Screen: NOT DETECTED

## 2015-02-16 LAB — C DIFFICILE QUICK SCREEN W PCR REFLEX
C Diff antigen: POSITIVE — AB
C Diff toxin: NEGATIVE

## 2015-02-16 LAB — CLOSTRIDIUM DIFFICILE BY PCR: CDIFFPCR: NEGATIVE

## 2015-02-16 MED ORDER — SUCRALFATE 1 GM/10ML PO SUSP
1.0000 g | Freq: Four times a day (QID) | ORAL | Status: DC
  Administered 2015-02-16 – 2015-02-18 (×6): 1 g via ORAL
  Filled 2015-02-16 (×6): qty 10

## 2015-02-16 MED ORDER — METOCLOPRAMIDE HCL 5 MG/ML IJ SOLN
10.0000 mg | Freq: Four times a day (QID) | INTRAMUSCULAR | Status: DC
  Administered 2015-02-16 – 2015-02-18 (×9): 10 mg via INTRAVENOUS
  Filled 2015-02-16 (×9): qty 2

## 2015-02-16 NOTE — Progress Notes (Signed)
[facility] - [place] at [facility]   PATIENT NAME: Lynn Larson    MR#:  [id number]  DATE OF BIRTH:  09/03/1965  SUBJECTIVE:  CHIEF COMPLAINT:   Chief Complaint  Patient presents with  . Abdominal Pain   this Lynn Larson is 49 year old Caucasian female with past medical history significant for history of multiple admissions to our hospital with the same complaints of nausea, vomiting and abdominal pain comes back to the hospital was because of recurrence of her nausea and vomiting as well as abdominal pain. Patient told me that he is she's been having nausea and vomiting over the past 2 days. She had vomiting with some blood and significant upper abdominal pain in upper abdomen, more on the right side than the left side. She was admitted to the hospital for further evaluation. However, she still has difficulty drinking clear liquid diet because of nausea and vomiting. Patient is status post cholecystectomy  Review of Systems  Constitutional: Negative for fever, chills and weight loss.  HENT: Negative for congestion.   Eyes: Negative for blurred vision and double vision.  Respiratory: Negative for cough, sputum production, shortness of breath and wheezing.   Cardiovascular: Negative for chest pain, palpitations, orthopnea, leg swelling and PND.  Gastrointestinal: Positive for nausea, vomiting and abdominal pain. Negative for diarrhea, constipation and blood in stool.  Genitourinary: Negative for dysuria, urgency, frequency and hematuria.  Musculoskeletal: Negative for falls.  Neurological: Negative for dizziness, tremors, focal weakness and headaches.  Endo/Heme/Allergies: Does not bruise/bleed easily.  Psychiatric/Behavioral: Negative for depression. The patient does not have insomnia.     VITAL SIGNS: Blood pressure 120/76, pulse 65, temperature 97.6 F (36.4 C), temperature source Oral, resp. rate 16, height 5\' 2"  (1.575 m), weight 74.5 kg (164 lb 3.9 oz), SpO2  96 %.  PHYSICAL EXAMINATION:   GENERAL:  49 y.o.-year-old patient lying in the bed in mild to moderate distress, very uncomfortable because of abdominal pain. She is ringing in the bed. She is also nauseated EYES: Pupils equal, round, reactive to light and accommodation. No scleral icterus. Extraocular muscles intact. Swollen bilateral eyelids, petechiae on eyelids HEENT: Head atraumatic, normocephalic. Oropharynx and nasopharynx clear.  NECK:  Supple, no jugular venous distention. No thyroid enlargement, no tenderness.  LUNGS: Normal breath sounds bilaterally, no wheezing, rales,rhonchi or crepitation. No use of accessory muscles of respiration.  CARDIOVASCULAR: S1, S2 normal. No murmurs, rubs, or gallops.  ABDOMEN: Soft, tender in upper abdomen, but all of her abdomen over in general, mostly in the right upper quadrant but no rebound or guarding were noted, nondistended. Bowel sounds present. No organomegaly or mass.  EXTREMITIES: No pedal edema, cyanosis, or clubbing.  NEUROLOGIC: Cranial nerves II through XII are intact. Muscle strength 5/5 in all extremities. Sensation intact. Gait not checked.  PSYCHIATRIC: The patient is alert and oriented x 3.  SKIN: No obvious rash, lesion, or ulcer.   ORDERS/RESULTS REVIEWED:   CBC  Recent Labs Lab 02/15/15 1924 02/16/15 0352  WBC 13.8* 11.3*  HGB 13.9 13.1  HCT 41.8 39.5  PLT 353 335  MCV 85.8 86.2  MCH 28.6 28.7  MCHC 33.3 33.3  RDW 14.6* 14.6*  LYMPHSABS 1.9  --   MONOABS 0.9  --   EOSABS 0.2  --   BASOSABS 0.1  --    ------------------------------------------------------------------------------------------------------------------  Chemistries   Recent Labs Lab 02/15/15 1924 02/16/15 0352  NA 143 141  K 3.3* 3.7  CL 110 112*  CO2 23 25  GLUCOSE 117* 99  BUN 14 12  CREATININE 0.76 0.63  CALCIUM 9.0 8.6*  AST 18  --   ALT 15  --   ALKPHOS 85  --   BILITOT 0.5  --     ------------------------------------------------------------------------------------------------------------------ estimated creatinine clearance is 79.6 mL/min (by C-G formula based on Cr of 0.63). ------------------------------------------------------------------------------------------------------------------ No results for input(s): TSH, T4TOTAL, T3FREE, THYROIDAB in the last 72 hours.  Invalid input(s): FREET3  Cardiac Enzymes  Recent Labs Lab 02/15/15 1924  TROPONINI <0.03   ------------------------------------------------------------------------------------------------------------------ Invalid input(s): POCBNP ---------------------------------------------------------------------------------------------------------------  RADIOLOGY: Dg Chest 1 View  02/15/2015   CLINICAL DATA:  Persistent nausea, vomiting and loose stools. Smoker.  EXAM: CHEST  1 VIEW  COMPARISON:  10/22/2013.  FINDINGS: Normal sized heart. Clear lungs. Stable minimal diffuse peribronchial thickening. Mild scoliosis.  IMPRESSION: Stable minimal chronic bronchitic changes.  No acute abnormality.   Electronically Signed   By: Beckie Salts M.D.   On: 02/15/2015 18:45    EKG:  Orders placed or performed during the hospital encounter of 02/15/15  . ED EKG  . ED EKG  . EKG 12-Lead  . EKG 12-Lead    ASSESSMENT AND PLAN:  Active Problems:   Nausea & vomiting 1. Intractable nausea and vomiting, unclear etiology at present, get urine drug screen to rule out cannabis related hyperemesis. Continue proton pump inhibitors 2. Abdominal pain, upper lip patient has peptic ulcer disease in the past which was treated. She is being continued on Carafate as well as Protonix at present, follow patient's condition and get gastroenterologist involved for further recommendations  3. Leukocytosis, possibly just stress reaction, improving today 4. Hypokalemia, resolved with therapy     Management plans discussed with the  patient, family and they are in agreement.   DRUG ALLERGIES:  Allergies  Allergen Reactions  . Morphine And Related     Cherry face    CODE STATUS:     Code Status Orders        Start     Ordered   02/15/15 2028  Full code   Continuous     02/15/15 2031      TOTAL TIME TAKING CARE OF THIS PATIENT: 40 minutes.    Katharina Caper M.D on 02/16/2015 at 1:40 PM  Between 7am to 6pm - Pager - [phone]  After 6pm go to [email or web address] - password [email or web address] [facility]  [place] [facility]  Office  [phone]  CC: Primary care physician; No primary care provider on file.

## 2015-02-16 NOTE — Progress Notes (Signed)
GI Inpatient Consult Note  Reason for Consult:   Attending Requesting Consult:  History of Present Illness: Lynn Larson is a 49 y.o. female with PMHx notable for astham, HTN, p/w one day of n/v, abd pain diarrhea.    Symtpoms started at work yesterday, rather sudden in onset.  Had similary acute presentation two weeks ago and was seen in ED and discharged.  Had CT a/p that was unremarkable for source of symptoms.  Abd pain is epigastric, RUQ, LUQ, constant and sharp.  Several epidoses of diarrhea since yesterday, no blood in the diarrhea, no melena.  Did note some bright red blood "chunks" in her vomit. Unsure if this started from the onset of vomiting or occurred later.    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   Did have similar presentation as this admission in 2014, managed conservatively with PPI drip.    Denies f/c, c/p, SOB.   She does have a lot of stress in life due to husband with CVA whom she takes care of. Recognizes some of this may be due to her nerves and stress and anxiety.    Past Medical History:  Past Medical History  Diagnosis Date  . Hypertension   . Asthma     Problem List: Patient Active Problem List   Diagnosis Date Noted  . Nausea & vomiting 02/15/2015    Past Surgical History: Past Surgical History  Procedure Laterality Date  . Cholecystectomy      Allergies: Allergies  Allergen Reactions  . Morphine And Related     Cherry face    Home Medications: Prescriptions prior to admission  Medication Sig Dispense Refill Last Dose  . albuterol (PROVENTIL HFA;VENTOLIN HFA) 108 (90 BASE) MCG/ACT inhaler Inhale 2 puffs into the lungs every 6 (six) hours as needed for wheezing or shortness of breath.   02/14/2015 at Unknown time   Home medication reconciliation was completed with the patient.   Scheduled Inpatient Medications:   . enoxaparin (LOVENOX) injection  40 mg Subcutaneous Q24H  .  metoCLOPramide (REGLAN) injection  10 mg Intravenous 4 times per day  . pantoprazole (PROTONIX) IV  40 mg Intravenous Q12H  . pneumococcal 23 valent vaccine  0.5 mL Intramuscular Tomorrow-1000    Continuous Inpatient Infusions:   . 0.9 % NaCl with KCl 40 mEq / L 75 mL/hr (02/16/15 1040)    PRN Inpatient Medications:  acetaminophen **OR** acetaminophen, albuterol, dicyclomine, HYDROmorphone (DILAUDID) injection, ondansetron (ZOFRAN) IV  Family History: The patient's family history is negative for inflammatory bowel disorders, GI malignancy, or solid organ transplantation excert GF with colon ca.   Social History:   reports that she has been smoking Cigarettes.  She has been smoking about 0.50 packs per day. She does not have any smokeless tobacco history on file. She reports that she does not drink alcohol.   Review of Systems: Constitutional: Weight is down Eyes: No changes in vision. ENT: No oral lesions, sore throat.  GI: see HPI.  Heme/Lymph: No easy bruising.  CV: No chest pain.  GU: No hematuria.  Integumentary: No rashes.  Neuro: No headaches.  Psych: +depression + anxiety Endocrine: + cold intolerance.  Allergic/Immunologic: No urticaria.  Resp: No cough, SOB.  Musculoskeletal: No joint swelling.    Physical Examination: BP 120/76 mmHg  Pulse 65  Temp(Src) 97.6 F (36.4 C) (Oral)  Resp 16  Ht 5\' 2"  (1.575 m)  Wt 74.5 kg (164  lb 3.9 oz)  BMI 30.03 kg/m2  SpO2 96% Gen: NAD, alert and oriented x 4, + tearful.  HEENT: PEERLA, EOMI, Neck: supple, no JVD or thyromegaly Chest: CTA bilaterally, no wheezes, crackles, or other adventitious sounds CV: RRR, no m/g/c/r Abd: + mild diffuse TTP, worse in epgiastric region, nabs, soft, no r/g.  Ext: no edema, well perfused with 2+ pulses, Skin: no rash or lesions noted Lymph: no LAD  Data: Lab Results  Component Value Date   WBC 11.3* 02/16/2015   HGB 13.1 02/16/2015   HCT 39.5 02/16/2015   MCV 86.2 02/16/2015    PLT 335 02/16/2015    Recent Labs Lab 02/15/15 1924 02/16/15 0352  HGB 13.9 13.1   Lab Results  Component Value Date   NA 141 02/16/2015   K 3.7 02/16/2015   CL 112* 02/16/2015   CO2 25 02/16/2015   BUN 12 02/16/2015   CREATININE 0.63 02/16/2015   Lab Results  Component Value Date   ALT 15 02/15/2015   AST 18 02/15/2015   ALKPHOS 85 02/15/2015   BILITOT 0.5 02/15/2015   No results for input(s): APTT, INR, PTT in the last 168 hours.   Assessment/Plan: [name] is a 49 y.o. female with acute onset n/v, diarrhea.   CT two weeks ago during similar presentation unremarkable. Labs unremarkable today except very mild leukocytosis.  Likely acute gastroenteritis. Also many stressors now surrounding husband with cva requring care, financial issues which are likley contributing to symtpoms.   Recommendations:  - stool studies for c.diff, o and p, culture - advance diet as tolerates - cont protonix for now. - carafate - EGD on Monday if symptoms continue, given hx of erosive gastritis, small amt blood in vomitus.   Thank you for the consult. Please call with questions or concerns.  Jessina Marse, [name], MD

## 2015-02-17 MED ORDER — METRONIDAZOLE 500 MG PO TABS
500.0000 mg | ORAL_TABLET | Freq: Three times a day (TID) | ORAL | Status: DC
  Administered 2015-02-18 (×2): 500 mg via ORAL
  Filled 2015-02-17 (×2): qty 1

## 2015-02-17 NOTE — Progress Notes (Signed)
GI Inpatient Follow-up Note  Patient Identification: Lynn Larson is a 49 y.o. female with n/v, diarrhea, abd pain  Subjective:  Abd pain, n/v improved.  Still having diarrhea 4 - 5 stool in past 24 hours.  No f/c.  C.diff antigen pos but toxin negative.   Scheduled Inpatient Medications:  . enoxaparin (LOVENOX) injection  40 mg Subcutaneous Q24H  . metoCLOPramide (REGLAN) injection  10 mg Intravenous 4 times per day  . pantoprazole (PROTONIX) IV  40 mg Intravenous Q12H  . pneumococcal 23 valent vaccine  0.5 mL Intramuscular Tomorrow-1000  . sucralfate  1 g Oral 4 times per day    Continuous Inpatient Infusions:   . 0.9 % NaCl with KCl 40 mEq / L 50 mL/hr (02/17/15 1236)    PRN Inpatient Medications:  acetaminophen **OR** acetaminophen, albuterol, dicyclomine, HYDROmorphone (DILAUDID) injection, ondansetron (ZOFRAN) IV  Review of Systems:    Physical Examination: BP 136/77 mmHg  Pulse 57  Temp(Src) 98.2 F (36.8 C) (Oral)  Resp 17  Ht  (1.575 m)  Wt 74.5 kg (164 lb 3.9 oz)  BMI 30.03 kg/m2  SpO2 99% Gen: NAD, alert and oriented x 4 HEENT: PEERLA, EOMI, Neck: supple, no JVD or thyromegaly Chest: CTA bilaterally, no wheezes, crackles, or other adventitious sounds CV: RRR, no m/g/c/r Abd: soft, mild diffuse TTP, ND, +BS in all four quadrants; no HSM, guarding, ridigity, or rebound tenderness Ext: no edema, well perfused with 2+ pulses, Skin: no rash or lesions noted Lymph: no LAD  Data: Lab Results  Component Value Date   WBC 11.3* 02/16/2015   HGB 13.1 02/16/2015   HCT 39.5 02/16/2015   MCV 86.2 02/16/2015   PLT 335 02/16/2015    Recent Labs Lab 02/15/15 1924 02/16/15 0352  HGB 13.9 13.1   Lab Results  Component Value Date   NA 141 02/16/2015   K 3.7 02/16/2015   CL 112* 02/16/2015   CO2 25 02/16/2015   BUN 12 02/16/2015   CREATININE 0.63 02/16/2015   Lab Results  Component Value Date   ALT 15 02/15/2015   AST 18 02/15/2015   ALKPHOS 85  02/15/2015   BILITOT 0.5 02/15/2015   No results for input(s): APTT, INR, PTT in the last 168 hours.   Assessment/Plan: Lynn Larson is a 49 y.o. female with n/v, diarrhea, abd pain.  Main complaint now is diarrhea.  Pain and n/v improved.  Since having diarrhea for over one week and c.diff antigen is positive, will go ahead and treat for cdiff although could jsut be carrier with other source for diarrhea.   Recommendations: - flagyl 500 q8 for 14 days ( see above) - will hold off on EGD since abd pain, n/v improving.  - cont PPI and carafate for now  Please call with questions or concerns.  [name], [name], MD

## 2015-02-17 NOTE — Progress Notes (Signed)
[facility] - [place] at [facility]   PATIENT NAME: Lynn Larson    MR#:  [id number]  DATE OF BIRTH:  May 12, 1966  SUBJECTIVE:  CHIEF COMPLAINT:   Chief Complaint  Patient presents with  . Abdominal Pain   this Maggi is 49 year old Caucasian female with past medical history significant for history of multiple admissions to our hospital with the same complaints of nausea, vomiting and abdominal pain comes back to the hospital was because of recurrence of her nausea and vomiting as well as abdominal pain. Patient told me that he is she's been having nausea and vomiting over the past 2 days. Seen today, says that her abdominal pain is better than before, nausea is also better. She had diarrhea yesterday but the diarrhea seems to be resolving.   Review of Systems  Constitutional: Negative for fever, chills and weight loss.  HENT: Negative for congestion.   Eyes: Negative for blurred vision and double vision.  Respiratory: Negative for cough, sputum production, shortness of breath and wheezing.   Cardiovascular: Negative for chest pain, palpitations, orthopnea, leg swelling and PND.  Gastrointestinal: Positive for abdominal pain. Negative for nausea, vomiting, diarrhea, constipation and blood in stool.  Genitourinary: Negative for dysuria, urgency, frequency and hematuria.  Musculoskeletal: Negative for falls.  Neurological: Negative for dizziness, tremors, focal weakness and headaches.  Endo/Heme/Allergies: Does not bruise/bleed easily.  Psychiatric/Behavioral: Negative for depression. The patient does not have insomnia.     VITAL SIGNS: Blood pressure 136/102, pulse 58, temperature 98.3 F (36.8 C), temperature source Oral, resp. rate 20, height  (1.575 m), weight 74.5 kg (164 lb 3.9 oz), SpO2 95 %.  PHYSICAL EXAMINATION:   GENERAL:  49 y.o.-year-old patient lying in the bed in mild to moderate distress, very uncomfortable because of abdominal pain. She  is ringing in the bed. She is also nauseated EYES: Pupils equal, round, reactive to light and accommodation. No scleral icterus. Extraocular muscles intact. Swollen bilateral eyelids, petechiae on eyelids HEENT: Head atraumatic, normocephalic. Oropharynx and nasopharynx clear.  NECK:  Supple, no jugular venous distention. No thyroid enlargement, no tenderness.  LUNGS: Normal breath sounds bilaterally, no wheezing, rales,rhonchi or crepitation. No use of accessory muscles of respiration.  CARDIOVASCULAR: S1, S2 normal. No murmurs, rubs, or gallops.  ABDOMEN: Soft, tender in upper abdomen, but all of her abdomen over in general, mostly in the right upper quadrant but no rebound or guarding were noted, nondistended. Bowel sounds present. No organomegaly or mass.  EXTREMITIES: No pedal edema, cyanosis, or clubbing.  NEUROLOGIC: Cranial nerves II through XII are intact. Muscle strength 5/5 in all extremities. Sensation intact. Gait not checked.  PSYCHIATRIC: The patient is alert and oriented x 3.  SKIN: No obvious rash, lesion, or ulcer.   ORDERS/RESULTS REVIEWED:   CBC  Recent Labs Lab 02/15/15 1924 02/16/15 0352  WBC 13.8* 11.3*  HGB 13.9 13.1  HCT 41.8 39.5  PLT 353 335  MCV 85.8 86.2  MCH 28.6 28.7  MCHC 33.3 33.3  RDW 14.6* 14.6*  LYMPHSABS 1.9  --   MONOABS 0.9  --   EOSABS 0.2  --   BASOSABS 0.1  --    ------------------------------------------------------------------------------------------------------------------  Chemistries   Recent Labs Lab 02/15/15 1924 02/16/15 0352  NA 143 141  K 3.3* 3.7  CL 110 112*  CO2 23 25  GLUCOSE 117* 99  BUN 14 12  CREATININE 0.76 0.63  CALCIUM 9.0 8.6*  AST 18  --   ALT 15  --  ALKPHOS 85  --   BILITOT 0.5  --    ------------------------------------------------------------------------------------------------------------------ estimated creatinine clearance is 79.6 mL/min (by C-G formula based on Cr of  0.63). ------------------------------------------------------------------------------------------------------------------ No results for input(s): TSH, T4TOTAL, T3FREE, THYROIDAB in the last 72 hours.  Invalid input(s): FREET3  Cardiac Enzymes  Recent Labs Lab 02/15/15 1924  TROPONINI <0.03   ------------------------------------------------------------------------------------------------------------------ Invalid input(s): POCBNP ---------------------------------------------------------------------------------------------------------------  RADIOLOGY: Dg Chest 1 View  02/15/2015   CLINICAL DATA:  Persistent nausea, vomiting and loose stools. Smoker.  EXAM: CHEST  1 VIEW  COMPARISON:  10/22/2013.  FINDINGS: Normal sized heart. Clear lungs. Stable minimal diffuse peribronchial thickening. Mild scoliosis.  IMPRESSION: Stable minimal chronic bronchitic changes.  No acute abnormality.   Electronically Signed   By: Beckie Salts M.D.   On: 02/15/2015 18:45    EKG:  Orders placed or performed during the hospital encounter of 02/15/15  . ED EKG  . ED EKG  . EKG 12-Lead  . EKG 12-Lead    ASSESSMENT AND PLAN:  Active Problems:   Nausea & vomiting 1. Intractable nausea and vomiting,related to possible gastritis: Continue IV PPIs, decrease IV fluids,; Urine toxicology is positive for cannabinoids: Probably her symptoms are because of the drug abuse and also.  2. Abdominal pain with nausea: Seen by  gi, scheduled for EGD  tomorrow. And if PPIs, Carafate. 3. Leukocytosis, possibly just stress reaction, improving today 4. Hypokalemia, resolved with therapy  5 diarrhea stools a C. difficile antigen is positive but interpretation is negative for PCR. Patient may be a carrier for C. difficile  but she does have active diarrhea so I'm going to. repeat the stool for C. difficile with PCR. Because of discordant results.continue isolation. Can not  distinguish this from toxigenic or nontoxigenic to  C. difficile based on antigen results. I .   DRUG ALLERGIES:  Allergies  Allergen Reactions  . Morphine And Related     Cherry face    CODE STATUS:     Code Status Orders        Start     Ordered   02/15/15 2028  Full code   Continuous     02/15/15 2031      TOTAL TIME TAKING CARE OF THIS PATIENT: 40 minutes.    Katha Hamming M.D on 02/17/2015 at 11:13 AM  Between 7am to 6pm - Pager - [phone]  After 6pm go to [email or web address] - password [email or web address] [facility]  [place] [facility]  Office  [phone]  CC: Primary care physician; No primary care provider on file.

## 2015-02-18 MED ORDER — SUCRALFATE 1 G PO TABS: 1.0000 g | ORAL_TABLET | Freq: Four times a day (QID) | ORAL | Status: DC

## 2015-02-18 MED ORDER — METRONIDAZOLE 500 MG PO TABS: 500.0000 mg | ORAL_TABLET | Freq: Three times a day (TID) | ORAL | Status: DC

## 2015-02-18 MED ORDER — SUCRALFATE 1 G PO TABS
1.0000 g | ORAL_TABLET | Freq: Four times a day (QID) | ORAL | Status: DC
  Administered 2015-02-18 (×2): 1 g via ORAL
  Filled 2015-02-18 (×2): qty 1

## 2015-02-18 NOTE — Progress Notes (Signed)
Notified Dr Luberta Mutter d/t pt request to be d/c tonight instead of tomorrow because pt stated that there was no one to take care of her husband who needs assistance d/t a previous stroke; Dr acknowledged, and stated she would look into possibly d/c pt tonight and make decision; RN informed pt that Dr had been notified of her request

## 2015-02-18 NOTE — Discharge Instructions (Signed)
Follow all MD discharge instructions. Take all medications as prescribed. Keep all follow up appointments. For all questions and/or concerns, call you doctor.

## 2015-02-18 NOTE — Progress Notes (Signed)
Nurse contacted Dr. Konidena Luberta Mutterrogress report of pt's discharge per patient's request to leave. Dr. Charline Bills that pt should be discharged within 15-20 minutes. Dr. Arrie Senate information to nurse and patient.

## 2015-02-18 NOTE — Progress Notes (Signed)
GI Note:  Dong well clincially, much imrpoved.   Recs: - ok for d/c with 14 day course of flagyl for possible c,.diff diarrhea.

## 2015-02-18 NOTE — Progress Notes (Signed)
Pt d/c home; d/c instructions reviewed w/ pt; pt understanding was verbalized; IV removed catheter in tact, gauze dressing applied; all pt questions answered; pt instructed to let RN know when ride was at hospital to pick her up; next shift RN Montel Clock informed in report that pt would be contacting her to be taken down by wheelchair when ride arrived

## 2015-02-18 NOTE — Progress Notes (Signed)
[facility] - [place] at [facility]   PATIENT NAME: Lynn Larson    MR#:  [id number]  DATE OF BIRTH:  [birth year]  SUBJECTIVE:  CHIEF COMPLAINT:   Chief Complaint  Patient presents with  . Abdominal Pain   this [name] is 49 year old Caucasian female with past medical history significant for history of multiple admissions to our hospital with the same complaints of nausea, vomiting and abdominal pain comes back to the hospital was because of recurrence of her nausea and vomiting as well as abdominal pain. Today, her nausea vomiting resolved. Diarrhea also resolved. Patient wanted to get regular food. Abdominal pain is also getting a lot better. Review of Systems  Constitutional: Negative for fever, chills and weight loss.  HENT: Negative for congestion.   Eyes: Negative for blurred vision and double vision.  Respiratory: Negative for cough, sputum production, shortness of breath and wheezing.   Cardiovascular: Negative for chest pain, palpitations, orthopnea, leg swelling and PND.  Gastrointestinal: Positive for abdominal pain. Negative for nausea, vomiting, diarrhea, constipation and blood in stool.  Genitourinary: Negative for dysuria, urgency, frequency and hematuria.  Musculoskeletal: Negative for falls.  Neurological: Negative for dizziness, tremors, focal weakness and headaches.  Endo/Heme/Allergies: Does not bruise/bleed easily.  Psychiatric/Behavioral: Negative for depression. The patient does not have insomnia.     VITAL SIGNS: Blood pressure 128/74, pulse 62, temperature 98.1 F (36.7 C), temperature source Oral, resp. rate 17, height 5\' 2"  (1.575 m), weight 74.5 kg (164 lb 3.9 oz), SpO2 97 %.  PHYSICAL EXAMINATION:   GENERAL:  49 y.o.-year-old patient lying in the bed in mild to moderate distress, very uncomfortable because of abdominal pain. She is ringing in the bed. She is also nauseated EYES: Pupils equal, round, reactive to light and  accommodation. No scleral icterus. Extraocular muscles intact. Swollen bilateral eyelids, petechiae on eyelids HEENT: Head atraumatic, normocephalic. Oropharynx and nasopharynx clear.  NECK:  Supple, no jugular venous distention. No thyroid enlargement, no tenderness.  LUNGS: Normal breath sounds bilaterally, no wheezing, rales,rhonchi or crepitation. No use of accessory muscles of respiration.  CARDIOVASCULAR: S1, S2 normal. No murmurs, rubs, or gallops.  ABDOMEN: Soft, tender in upper abdomen, but all of her abdomen over in general, mostly in the right upper quadrant but no rebound or guarding were noted, nondistended. Bowel sounds present. No organomegaly or mass.  EXTREMITIES: No pedal edema, cyanosis, or clubbing.  NEUROLOGIC: Cranial nerves II through XII are intact. Muscle strength 5/5 in all extremities. Sensation intact. Gait not checked.  PSYCHIATRIC: The patient is alert and oriented x 3.  SKIN: No obvious rash, lesion, or ulcer.   ORDERS/RESULTS REVIEWED:   CBC  Recent Labs Lab 02/15/15 1924 02/16/15 0352  WBC 13.8* 11.3*  HGB 13.9 13.1  HCT 41.8 39.5  PLT 353 335  MCV 85.8 86.2  MCH 28.6 28.7  MCHC 33.3 33.3  RDW 14.6* 14.6*  LYMPHSABS 1.9  --   MONOABS 0.9  --   EOSABS 0.2  --   BASOSABS 0.1  --    ------------------------------------------------------------------------------------------------------------------  Chemistries   Recent Labs Lab 02/15/15 1924 02/16/15 0352  NA 143 141  K 3.3* 3.7  CL 110 112*  CO2 23 25  GLUCOSE 117* 99  BUN 14 12  CREATININE 0.76 0.63  CALCIUM 9.0 8.6*  AST 18  --   ALT 15  --   ALKPHOS 85  --   BILITOT 0.5  --    ------------------------------------------------------------------------------------------------------------------ estimated creatinine clearance is 79.6 mL/min (  by C-G formula based on Cr of  0.63). ------------------------------------------------------------------------------------------------------------------ No results for input(s): TSH, T4TOTAL, T3FREE, THYROIDAB in the last 72 hours.  Invalid input(s): FREET3  Cardiac Enzymes  Recent Labs Lab 02/15/15 1924  TROPONINI <0.03   ------------------------------------------------------------------------------------------------------------------ Invalid input(s): POCBNP ---------------------------------------------------------------------------------------------------------------  RADIOLOGY: No results found.  EKG:  Orders placed or performed during the hospital encounter of 02/15/15  . ED EKG  . ED EKG  . EKG 12-Lead  . EKG 12-Lead    ASSESSMENT AND PLAN:  Active Problems:   Nausea & vomiting 1. Intractable nausea and vomiting,related to possible gastritis: Continue IV PPIs, gums are improving, discontinue IV fluids .advance to regular diet.,; Urine toxicology is positive for cannabinoids: Probably her symptoms are because of the drug abuse and also.  2. Abdominal pain with nausea: Seen by  gi, gastritis gastritis continue PPIs, Carafate  have a EGD as an outpatient. 3. Leukocytosis, possibly just stress reaction, improving today 4. Hypokalemia, resolved with therapy  5 diarrhea stools a C. difficile antigen is positive but interpretation is negative for PCR. Patient may be a carrier for C. difficile  but she does have active diarrhea;started  on Flagyl.. Discontinue pain medication, discontinue IV fluids. Likely discharge tomorrow. Copd;continue inhalers,advised to quit smoking  DRUG ALLERGIES:  Allergies  Allergen Reactions  . Morphine And Related     Cherry face    CODE STATUS:     Code Status Orders        Start     Ordered   02/15/15 2028  Full code   Continuous     02/15/15 2031      TOTAL TIME TAKING CARE OF THIS PATIENT: 40 minutes.    Katha Hamming M.D on 02/18/2015 at 11:37  AM  Between 7am to 6pm - Pager - [phone]  After 6pm go to [email or web address] - password [email or web address] [facility]  [place] [facility]  Office  [phone]  CC: Primary care physician; No primary care provider on file.

## 2015-02-19 LAB — STOOL CULTURE
CULTURE: NOT DETECTED
SPECIAL REQUESTS: NORMAL

## 2015-02-21 LAB — GIARDIA, EIA; OVA/PARASITE: GIARDIA AG STL: NEGATIVE

## 2015-02-21 LAB — O&P RESULT

## 2015-02-21 NOTE — Discharge Summary (Signed)
Lynn Larson, is a 49 y.o. female  DOB 1966/05/11  MRN [id number].  Admission date:  02/15/2015  Admitting Physician  Katha Hamming, MD  Discharge Date:  02/18/2015   Primary MD  No primary care provider on file.  Recommendations for primary care physician for things to follow:   Follow up with PMD in one week   Admission Diagnosis  Diarrhea [R19.7] Abdominal pain, unspecified abdominal location [R10.9] Bilious vomiting with nausea [R11.14]   Discharge Diagnosis  Diarrhea [R19.7] Abdominal pain, unspecified abdominal location [R10.9] Bilious vomiting with nausea [R11.14]    Active Problems:   Nausea & vomiting      Past Medical History  Diagnosis Date  . Hypertension   . Asthma     Past Surgical History  Procedure Laterality Date  . Cholecystectomy         History of present illness and  Hospital Course:     Kindly see H&P for history of present illness and admission details, please review complete Labs, Consult reports and Test reports for all details in brief  HPI  from the history and physical done on the day of admission  49 yr old female with nausea/vomiting/abdominal pain.admitted,for acute gastritis/ Started on IV PPI,IV fluids,IV pain and IV nausea meds,.  Hospital Course   1.Intractable Nausea/vomiting/Abdominal pain;admitted for acute gastritis.improved with fluids,ppi,zofran..her abdominal pain,nausea resolved and we advanced her diet.discharged home in stable condition. 2.Diarrhea,Cdiff antigen  Positive.cdiff PCR negative.but because of diarrhea ,started on flagyl and gave prescription for flagyl for 14 days. 3.hypopokalemia;improved with replacement, 4.h/o COPD;continued on home meds.  Discharge Condition: stable   Follow UP  Follow-up Information    Follow up with [name],  Lynn Naegeli, MD In 1 week.   Specialty:  Gastroenterology   Contact information:   [address] [place] [address] [phone]         Discharge Instructions  and  Discharge Medications        Medication List    TAKE these medications        albuterol 108 (90 BASE) MCG/ACT inhaler  Commonly known as:  PROVENTIL HFA;VENTOLIN HFA  Inhale 2 puffs into the lungs every 6 (six) hours as needed for wheezing or shortness of breath.     metroNIDAZOLE 500 MG tablet  Commonly known as:  FLAGYL  Take 1 tablet (500 mg total) by mouth 3 (three) times daily.     sucralfate 1 G tablet  Commonly known as:  CARAFATE  Take 1 tablet (1 g total) by mouth 4 (four) times daily.          Diet and Activity recommendation: See Discharge Instructions above   Consults obtained -GI   Major procedures and Radiology Reports - PLEASE review detailed and final reports for all details, in brief -     Dg Chest 1 View  02/15/2015   CLINICAL DATA:  Persistent nausea, vomiting and loose stools. Smoker.  EXAM: CHEST  1 VIEW  COMPARISON:  10/22/2013.  FINDINGS: Normal sized heart. Clear lungs. Stable minimal diffuse peribronchial thickening. Mild scoliosis.  IMPRESSION: Stable minimal chronic bronchitic changes.  No acute abnormality.   Electronically Signed   By: Beckie Salts M.D.   On: 02/15/2015 18:45   Ct Abdomen Pelvis W Contrast  02/04/2015   CLINICAL DATA:  Diffuse abdominal pain  EXAM: CT ABDOMEN AND PELVIS WITH CONTRAST  TECHNIQUE: Multidetector CT imaging of the abdomen and pelvis was performed using the standard protocol following bolus administration of  intravenous contrast.  CONTRAST:  OMNIPAQUE IOHEXOL 300 MG/ML  SOLN  COMPARISON:  10/22/2013  FINDINGS: BODY WALL: No contributory findings.  LOWER CHEST: No contributory findings.  ABDOMEN/PELVIS:  Liver: No focal abnormality.  Biliary: Cholecystectomy with chronic mild intrahepatic and extrahepatic bile duct enlargement.  Noted negative biliary labs.  Pancreas: Unremarkable.  Spleen: Unremarkable.  Adrenals: Unremarkable.  Kidneys and ureters: No hydronephrosis or stone. Bilateral sub cm cortical low densities are too small to characterize.  Bladder: Unremarkable.  Reproductive: No pathologic findings.  Bowel: Apparent colonic wall thickening is best explained by underdistention. Mild distal colonic diverticulosis. No bowel obstruction. No appendicitis.  Retroperitoneum: No mass or adenopathy.  Peritoneum: No ascites or pneumoperitoneum.  Vascular: No acute abnormality.  Aortic and iliac atherosclerosis  OSSEOUS: No acute abnormalities.  IMPRESSION: Stable exam.  No explanation for patient's symptoms.   Electronically Signed   By: Marnee Spring M.D.   On: 02/04/2015 23:26    Micro Results    Recent Results (from the past 240 hour(s))  C difficile quick scan w PCR reflex     Status: Abnormal   Collection Time: 02/16/15  8:14 PM  Result Value Ref Range Status   C Diff antigen POSITIVE (A) NEGATIVE Final   C Diff toxin NEGATIVE NEGATIVE Final   C Diff interpretation   Final    Negative for toxigenic C. difficile. Toxin gene and active toxin production not detected. May be a nontoxigenic strain of C. difficile bacteria present, lacking the ability to produce toxin.  Clostridium Difficile by PCR     Status: None   Collection Time: 02/16/15  8:14 PM  Result Value Ref Range Status   Toxigenic C Difficile by pcr NEGATIVE NEGATIVE Final  Stool culture     Status: None   Collection Time: 02/17/15 12:04 PM  Result Value Ref Range Status   Specimen Description STOOL  Final   Special Requests Normal  Final   Culture   Final    NO CAMPYLOBACTER DETECTED NO SALMONELLA OR SHIGELLA ISOLATED No Pathogenic E. coli detected    Report Status 02/19/2015 FINAL  Final       Today   Subjective:   Marijo Quizon today has no headache,no chest abdominal pain,no new weakness tingling or numbness, feels much better wants to  go home today.   Objective:   Blood pressure 128/74, pulse 62, temperature 98.1 F (36.7 C), temperature source Oral, resp. rate 17, height  (1.575 m), weight 74.5 kg (164 lb 3.9 oz), SpO2 97 %.  No intake or output data in the 24 hours ending 02/21/15 0946  Exam Awake Alert, Oriented x 3, No new F.N deficits, Normal affect [place].AT,PERRAL Supple Neck,No JVD, No cervical lymphadenopathy appriciated.  Symmetrical Chest wall movement, Good air movement bilaterally, CTAB RRR,No Gallops,Rubs or new Murmurs, No Parasternal Heave +ve B.Sounds, Abd Soft, Non tender, No organomegaly appriciated, No rebound -guarding or rigidity. No Cyanosis, Clubbing or edema, No new Rash or bruise  Data Review   CBC w Diff:  Lab Results  Component Value Date   WBC 11.3* 02/16/2015   WBC 8.0 10/24/2013   HGB 13.1 02/16/2015   HGB 14.9 10/22/2013   HCT 39.5 02/16/2015   HCT 42.8 10/22/2013   PLT 335 02/16/2015   PLT 353 10/22/2013   LYMPHOPCT 14 02/15/2015   LYMPHOPCT 38.2 08/26/2012   MONOPCT 6 02/15/2015   MONOPCT 10.3 08/26/2012   EOSPCT 2 02/15/2015   EOSPCT 3.2 08/26/2012   BASOPCT 1  02/15/2015   BASOPCT 0.9 08/26/2012    CMP:  Lab Results  Component Value Date   NA 141 02/16/2015   NA 136 10/22/2013   K 3.7 02/16/2015   K 3.6 10/22/2013   CL 112* 02/16/2015   CL 106 10/22/2013   CO2 25 02/16/2015   CO2 24 10/22/2013   BUN 12 02/16/2015   BUN 19* 10/22/2013   CREATININE 0.63 02/16/2015   CREATININE 0.58* 10/22/2013   PROT 7.6 02/15/2015   PROT 8.4* 10/22/2013   ALBUMIN 4.4 02/15/2015   ALBUMIN 4.2 10/22/2013   BILITOT 0.5 02/15/2015   BILITOT 0.8 10/22/2013   ALKPHOS 85 02/15/2015   ALKPHOS 105 10/22/2013   AST 18 02/15/2015   AST 25 10/22/2013   ALT 15 02/15/2015   ALT 34 10/22/2013  .   Total Time in preparing paper work, data evaluation and todays exam - 35 minutes  Yonas Bunda M.D on 02/18/2015 at 9:46 AM

## 2015-04-16 ENCOUNTER — Emergency Department: Payer: Self-pay

## 2015-04-16 ENCOUNTER — Emergency Department
Admission: EM | Admit: 2015-04-16 | Discharge: 2015-04-16 | Disposition: A | Discharge status: Home / Self Care | Payer: Self-pay | Attending: Emergency Medicine | Admitting: Emergency Medicine

## 2015-04-16 ENCOUNTER — Encounter: Payer: Self-pay | Admitting: Emergency Medicine

## 2015-04-16 DIAGNOSIS — S51831A Puncture wound without foreign body of right forearm, initial encounter: Secondary | ICD-10-CM | POA: Insufficient documentation

## 2015-04-16 DIAGNOSIS — Y9389 Activity, other specified: Secondary | ICD-10-CM | POA: Insufficient documentation

## 2015-04-16 DIAGNOSIS — Z720 Tobacco use: Secondary | ICD-10-CM | POA: Insufficient documentation

## 2015-04-16 DIAGNOSIS — W540XXA Bitten by dog, initial encounter: Secondary | ICD-10-CM | POA: Insufficient documentation

## 2015-04-16 DIAGNOSIS — Y998 Other external cause status: Secondary | ICD-10-CM | POA: Insufficient documentation

## 2015-04-16 DIAGNOSIS — Z79899 Other long term (current) drug therapy: Secondary | ICD-10-CM | POA: Insufficient documentation

## 2015-04-16 DIAGNOSIS — S51811A Laceration without foreign body of right forearm, initial encounter: Secondary | ICD-10-CM | POA: Insufficient documentation

## 2015-04-16 DIAGNOSIS — Y9289 Other specified places as the place of occurrence of the external cause: Secondary | ICD-10-CM | POA: Insufficient documentation

## 2015-04-16 DIAGNOSIS — IMO0002 Reserved for concepts with insufficient information to code with codable children: Secondary | ICD-10-CM

## 2015-04-16 DIAGNOSIS — Z23 Encounter for immunization: Secondary | ICD-10-CM | POA: Insufficient documentation

## 2015-04-16 DIAGNOSIS — I10 Essential (primary) hypertension: Secondary | ICD-10-CM | POA: Insufficient documentation

## 2015-04-16 MED ORDER — BACITRACIN ZINC 500 UNIT/GM EX OINT
1.0000 | TOPICAL_OINTMENT | Freq: Two times a day (BID) | CUTANEOUS | Status: DC
Start: 2015-04-16 — End: 2015-04-16
  Administered 2015-04-16: 1 via TOPICAL
  Filled 2015-04-16: qty 0.9

## 2015-04-16 MED ORDER — TETANUS-DIPHTH-ACELL PERTUSSIS 5-2.5-18.5 LF-MCG/0.5 IM SUSP
0.5000 mL | Freq: Once | INTRAMUSCULAR | Status: AC
  Administered 2015-04-16: 0.5 mL via INTRAMUSCULAR
  Filled 2015-04-16: qty 0.5

## 2015-04-16 MED ORDER — LIDOCAINE HCL (PF) 1 % IJ SOLN
5.0000 mL | Freq: Once | INTRAMUSCULAR | Status: DC
  Filled 2015-04-16: qty 5

## 2015-04-16 MED ORDER — OXYCODONE-ACETAMINOPHEN 5-325 MG PO TABS: 1.0000 | ORAL_TABLET | Freq: Four times a day (QID) | ORAL | Status: DC | PRN

## 2015-04-16 MED ORDER — OXYCODONE HCL 5 MG PO TABS
5.0000 mg | ORAL_TABLET | Freq: Once | ORAL | Status: AC
  Administered 2015-04-16: 5 mg via ORAL

## 2015-04-16 MED ORDER — AMOXICILLIN-POT CLAVULANATE 875-125 MG PO TABS
1.0000 | ORAL_TABLET | Freq: Two times a day (BID) | ORAL | Status: DC
Start: 2015-04-16 — End: 2015-08-17

## 2015-04-16 MED ORDER — OXYCODONE HCL 5 MG PO TABS
ORAL_TABLET | ORAL | Status: AC
  Filled 2015-04-16: qty 1

## 2015-04-16 NOTE — ED Notes (Signed)
Just prior to arrival own dog bit her and she pulled arm away, tear R forearm.

## 2015-04-16 NOTE — ED Provider Notes (Signed)
[facility] Emergency Department Provider Note ____________________________________________  Time seen: Approximately 4:42 PM  I have reviewed the triage vital signs and the nursing notes.   HISTORY  Chief Complaint Animal Bite Dog bite  HPI Lynn Larson is a 49 y.o. female who presents to the emergency department for evaluation of a dog bite. She states that herpicture dog stepped on her little dog and she went to try and get the Baker dog off of the smaller dog when the larger dog bit her arm. She states that this is her own dog and he is up-to-date on his vaccinations, including rabies. She is unsure of her last tetanus shot. She reports that the pain is worsening and rates it an 8/10.   Past Medical History  Diagnosis Date  . Hypertension   . Asthma     Patient Active Problem List   Diagnosis Date Noted  . Nausea & vomiting 02/15/2015    Past Surgical History  Procedure Laterality Date  . Cholecystectomy      Current Outpatient Rx  Name  Route  Sig  Dispense  Refill  . albuterol (PROVENTIL HFA;VENTOLIN HFA) 108 (90 BASE) MCG/ACT inhaler   Inhalation   Inhale 2 puffs into the lungs every 6 (six) hours as needed for wheezing or shortness of breath.         Marland Kitchen amoxicillin-clavulanate (AUGMENTIN) 875-125 MG tablet   Oral   Take 1 tablet by mouth 2 (two) times daily.   20 tablet   0   . metroNIDAZOLE (FLAGYL) 500 MG tablet   Oral   Take 1 tablet (500 mg total) by mouth 3 (three) times daily.   42 tablet   0   . oxyCODONE-acetaminophen (ROXICET) 5-325 MG tablet   Oral   Take 1 tablet by mouth every 6 (six) hours as needed.   6 tablet   0   . sucralfate (CARAFATE) 1 G tablet   Oral   Take 1 tablet (1 g total) by mouth 4 (four) times daily.   40 tablet   0     Allergies Morphine and related  No family history on file.  Social History Social History  Substance Use Topics  . Smoking status: Current Every Day Smoker -- 0.50  packs/day    Types: Cigarettes  . Smokeless tobacco: None  . Alcohol Use: No    Review of Systems   Constitutional: No fever/chills Eyes: No visual changes. ENT: No congestion or rhinorrhea Cardiovascular: Denies chest pain. Respiratory: Denies shortness of breath. Gastrointestinal: No abdominal pain.  No nausea, no vomiting.  No diarrhea.  No constipation. Genitourinary: Negative for dysuria. Musculoskeletal: Negative for back pain. Skin: Positive for lacerations and puncture wounds to the right forearm Neurological: Negative for headaches, focal weakness or numbness.  10-point ROS otherwise negative.  ____________________________________________   PHYSICAL EXAM:  VITAL SIGNS: ED Triage Vitals  Enc Vitals Group     BP 04/16/15 1640 109/71 mmHg     Pulse Rate 04/16/15 1640 98     Resp 04/16/15 1640 20     Temp 04/16/15 1640 98.2 F (36.8 C)     Temp Source 04/16/15 1640 Oral     SpO2 04/16/15 1640 96 %     Weight 04/16/15 1640 169 lb (76.658 kg)     Height 04/16/15 1640  (1.6 m)     Head Cir --      Peak Flow --      Pain Score --  Pain Loc --      Pain Edu? --      Excl. in GC? --     Constitutional: Alert and oriented. Well appearing and in no acute distress. Eyes: Conjunctivae are normal. PERRL. EOMI. Head: Atraumatic. Nose: No congestion/rhinnorhea. Mouth/Throat: Mucous membranes are moist.  Oropharynx non-erythematous. No oral lesions. Neck: No stridor. Cardiovascular: Normal rate, regular rhythm.  Good peripheral circulation. Respiratory: Normal respiratory effort.  No retractions. Lungs CTAB. Gastrointestinal: Soft and nontender. No distention. No abdominal bruits.  Musculoskeletal: No lower extremity tenderness nor edema.  No joint effusions. Neurologic:  Normal speech and language. No gross focal neurologic deficits are appreciated. Speech is normal. No gait instability. Skin:  Laceration with subcutaneous exposure noted to the right  forearm. There is also a skin tear and 3 puncture wounds noted; Negative for petechiae.  Psychiatric: Mood and affect are normal. Speech and behavior are normal.  ____________________________________________   LABS (all labs ordered are listed, but only abnormal results are displayed)  Labs Reviewed - No data to display ____________________________________________  EKG   ____________________________________________  RADIOLOGY  No radial opaque foreign body or bony abnormality noted.  I, Kem Boroughsari Catheline Hixon, personally viewed and evaluated these images (plain radiographs) as part of my medical decision making.   ____________________________________________   PROCEDURES  Procedure(s) performed:   LACERATION REPAIR Performed by: Kem Boroughsari Russie Gulledge Authorized by: Kem Boroughsari Britain Anagnos Consent: Verbal consent obtained. Risks and benefits: risks, benefits and alternatives were discussed Consent given by: patient Patient identity confirmed: provided demographic data Prepped and Draped in normal sterile fashion Wound explored  Laceration Location: right forearm  Laceration Length: 4cm  No Foreign Bodies seen or palpated  Anesthesia: local infiltration  Local anesthetic: lidocaine 1% without epinephrine  Anesthetic total: 4 ml  Irrigation method: syringe Amount of cleaning: standard  Skin closure: 5-0 vicryl; 5-0 nylon  Number of sutures: 1 subcutaneous; 3 external  Technique: simple interrupted--loose closure   Patient tolerance: Patient tolerated the procedure well with no immediate complications.  ____________________________________________   INITIAL IMPRESSION / ASSESSMENT AND PLAN / ED COURSE  Pertinent labs & imaging results that were available during my care of the patient were reviewed by me and considered in my medical decision making (see chart for details).  Patient was advised to take the Augmentin as prescribed and until finished. We discussed wound care. She  was instructed to return to the emergency department immediately for any concern/sign of infection. She was advised to return in 10 days for suture removal. ____________________________________________   FINAL CLINICAL IMPRESSION(S) / ED DIAGNOSES  Final diagnoses:  Dog bite  Laceration       Chinita PesterCari B Glynn Freas, FNP 04/16/15 2216  Minna AntisKevin Paduchowski, MD 04/16/15 2245

## 2015-05-08 ENCOUNTER — Emergency Department: Payer: Self-pay

## 2015-05-08 ENCOUNTER — Encounter: Payer: Self-pay | Admitting: Urgent Care

## 2015-05-08 ENCOUNTER — Emergency Department
Admission: EM | Admit: 2015-05-08 | Discharge: 2015-05-08 | Disposition: A | Discharge status: Home / Self Care | Payer: Self-pay | Attending: Emergency Medicine | Admitting: Emergency Medicine

## 2015-05-08 DIAGNOSIS — Z792 Long term (current) use of antibiotics: Secondary | ICD-10-CM | POA: Insufficient documentation

## 2015-05-08 DIAGNOSIS — R112 Nausea with vomiting, unspecified: Secondary | ICD-10-CM | POA: Insufficient documentation

## 2015-05-08 DIAGNOSIS — R1110 Vomiting, unspecified: Secondary | ICD-10-CM

## 2015-05-08 DIAGNOSIS — R1013 Epigastric pain: Secondary | ICD-10-CM

## 2015-05-08 DIAGNOSIS — K529 Noninfective gastroenteritis and colitis, unspecified: Secondary | ICD-10-CM | POA: Insufficient documentation

## 2015-05-08 DIAGNOSIS — R197 Diarrhea, unspecified: Secondary | ICD-10-CM

## 2015-05-08 DIAGNOSIS — Z79899 Other long term (current) drug therapy: Secondary | ICD-10-CM | POA: Insufficient documentation

## 2015-05-08 DIAGNOSIS — F17210 Nicotine dependence, cigarettes, uncomplicated: Secondary | ICD-10-CM | POA: Insufficient documentation

## 2015-05-08 DIAGNOSIS — I10 Essential (primary) hypertension: Secondary | ICD-10-CM | POA: Insufficient documentation

## 2015-05-08 LAB — COMPREHENSIVE METABOLIC PANEL
ALK PHOS: 89 U/L (ref 38–126)
ALT: 21 U/L (ref 14–54)
ANION GAP: 8 (ref 5–15)
AST: 23 U/L (ref 15–41)
Albumin: 4.6 g/dL (ref 3.5–5.0)
BUN: 15 mg/dL (ref 6–20)
CALCIUM: 10.1 mg/dL (ref 8.9–10.3)
CHLORIDE: 106 mmol/L (ref 101–111)
CO2: 26 mmol/L (ref 22–32)
Creatinine, Ser: 0.69 mg/dL (ref 0.44–1.00)
GFR calc non Af Amer: >60 mL/min (ref >60)
Glucose, Bld: 120 mg/dL — ABNORMAL HIGH (ref 65–99)
Potassium: 3.7 mmol/L (ref 3.5–5.1)
SODIUM: 140 mmol/L (ref 135–145)
Total Bilirubin: 0.4 mg/dL (ref 0.3–1.2)
Total Protein: 8.5 g/dL — ABNORMAL HIGH (ref 6.5–8.1)

## 2015-05-08 LAB — CBC
HCT: 47.5 % — ABNORMAL HIGH (ref 35.0–47.0)
HEMOGLOBIN: 15.7 g/dL (ref 12.0–16.0)
MCH: 28.4 pg (ref 26.0–34.0)
MCHC: 33.2 g/dL (ref 32.0–36.0)
MCV: 85.6 fL (ref 80.0–100.0)
Platelets: 399 10*3/uL (ref 150–440)
RBC: 5.55 MIL/uL — AB (ref 3.80–5.20)
RDW: 14.5 % (ref 11.5–14.5)
WBC: 21.4 10*3/uL — AB (ref 3.6–11.0)

## 2015-05-08 LAB — LIPASE, BLOOD: LIPASE: 43 U/L (ref 11–51)

## 2015-05-08 MED ORDER — FENTANYL CITRATE (PF) 100 MCG/2ML IJ SOLN
100.0000 ug | Freq: Once | INTRAMUSCULAR | Status: AC
  Administered 2015-05-08: 100 ug via INTRAVENOUS
  Filled 2015-05-08: qty 2

## 2015-05-08 MED ORDER — OXYCODONE-ACETAMINOPHEN 5-325 MG PO TABS: 1.0000 | ORAL_TABLET | Freq: Four times a day (QID) | ORAL | Status: DC | PRN

## 2015-05-08 MED ORDER — CIPROFLOXACIN IN D5W 400 MG/200ML IV SOLN
400.0000 mg | Freq: Once | INTRAVENOUS | Status: AC
  Administered 2015-05-08: 400 mg via INTRAVENOUS
  Filled 2015-05-08: qty 200

## 2015-05-08 MED ORDER — PROMETHAZINE HCL 25 MG RE SUPP: 25.0000 mg | Freq: Four times a day (QID) | RECTAL | Status: DC | PRN

## 2015-05-08 MED ORDER — SODIUM CHLORIDE 0.9 % IV BOLUS (SEPSIS)
1000.0000 mL | Freq: Once | INTRAVENOUS | Status: AC
  Administered 2015-05-08: 1000 mL via INTRAVENOUS

## 2015-05-08 MED ORDER — FENTANYL CITRATE (PF) 100 MCG/2ML IJ SOLN
50.0000 ug | Freq: Once | INTRAMUSCULAR | Status: AC
  Administered 2015-05-08: 50 ug via INTRAVENOUS

## 2015-05-08 MED ORDER — IOHEXOL 240 MG/ML SOLN
25.0000 mL | Freq: Once | INTRAMUSCULAR | Status: AC | PRN
  Administered 2015-05-08: 25 mL via ORAL

## 2015-05-08 MED ORDER — HALOPERIDOL LACTATE 5 MG/ML IJ SOLN
2.5000 mg | Freq: Once | INTRAMUSCULAR | Status: AC
  Administered 2015-05-08: 2.5 mg via INTRAVENOUS
  Filled 2015-05-08: qty 1

## 2015-05-08 MED ORDER — FENTANYL CITRATE (PF) 100 MCG/2ML IJ SOLN: 50.0000 ug | Freq: Once | INTRAMUSCULAR | Status: DC

## 2015-05-08 MED ORDER — METRONIDAZOLE 500 MG PO TABS: 500.0000 mg | ORAL_TABLET | Freq: Two times a day (BID) | ORAL | Status: AC

## 2015-05-08 MED ORDER — MORPHINE SULFATE (PF) 2 MG/ML IV SOLN
INTRAVENOUS | Status: AC
  Filled 2015-05-08: qty 1

## 2015-05-08 MED ORDER — METOCLOPRAMIDE HCL 5 MG/ML IJ SOLN
10.0000 mg | Freq: Once | INTRAMUSCULAR | Status: AC
  Administered 2015-05-08: 10 mg via INTRAVENOUS

## 2015-05-08 MED ORDER — METRONIDAZOLE IN NACL 5-0.79 MG/ML-% IV SOLN
500.0000 mg | Freq: Once | INTRAVENOUS | Status: AC
  Administered 2015-05-08: 500 mg via INTRAVENOUS
  Filled 2015-05-08: qty 100

## 2015-05-08 MED ORDER — METRONIDAZOLE IN NACL 5-0.79 MG/ML-% IV SOLN
INTRAVENOUS | Status: AC
  Filled 2015-05-08: qty 100

## 2015-05-08 MED ORDER — LORAZEPAM 2 MG/ML IJ SOLN
INTRAMUSCULAR | Status: AC
  Administered 2015-05-08: 1 mg via INTRAVENOUS
  Filled 2015-05-08: qty 1

## 2015-05-08 MED ORDER — IOHEXOL 350 MG/ML SOLN
100.0000 mL | Freq: Once | INTRAVENOUS | Status: AC | PRN
  Administered 2015-05-08: 100 mL via INTRAVENOUS

## 2015-05-08 MED ORDER — HYDROMORPHONE HCL 1 MG/ML IJ SOLN
0.5000 mg | Freq: Once | INTRAMUSCULAR | Status: AC
  Administered 2015-05-08: 0.5 mg via INTRAVENOUS

## 2015-05-08 MED ORDER — LORAZEPAM 2 MG/ML IJ SOLN
1.0000 mg | Freq: Once | INTRAMUSCULAR | Status: AC
  Administered 2015-05-08: 1 mg via INTRAVENOUS

## 2015-05-08 MED ORDER — ONDANSETRON 8 MG PO TBDP
8.0000 mg | ORAL_TABLET | Freq: Once | ORAL | Status: AC | PRN
  Administered 2015-05-08: 8 mg via ORAL

## 2015-05-08 MED ORDER — ONDANSETRON 8 MG PO TBDP
ORAL_TABLET | ORAL | Status: AC
  Administered 2015-05-08: 8 mg via ORAL
  Filled 2015-05-08: qty 1

## 2015-05-08 MED ORDER — METOCLOPRAMIDE HCL 5 MG/ML IJ SOLN
INTRAMUSCULAR | Status: AC
  Administered 2015-05-08: 10 mg via INTRAVENOUS
  Filled 2015-05-08: qty 2

## 2015-05-08 MED ORDER — FENTANYL CITRATE (PF) 100 MCG/2ML IJ SOLN
50.0000 ug | Freq: Once | INTRAMUSCULAR | Status: AC
  Administered 2015-05-08: 50 ug via INTRAVENOUS
  Filled 2015-05-08: qty 2

## 2015-05-08 MED ORDER — CIPROFLOXACIN HCL 500 MG PO TABS: 500.0000 mg | ORAL_TABLET | Freq: Two times a day (BID) | ORAL | Status: AC

## 2015-05-08 MED ORDER — HYDROMORPHONE HCL 1 MG/ML IJ SOLN
INTRAMUSCULAR | Status: AC
  Administered 2015-05-08: 0.5 mg via INTRAVENOUS
  Filled 2015-05-08: qty 1

## 2015-05-08 NOTE — ED Provider Notes (Signed)
-----------------------------------------   10:22 AM on 05/08/2015 -----------------------------------------  Patient with improvement of her pain at this time. Still with asymptomatic hypertension but does have a history of hypertension. We discussed return precautions, need for close PCP follow-up and she is comfortable with discharge plan. She'll follow-up with [facility] for medication assistance.  Gayla DossEryka A Ayham Word, MD 05/08/15 1022

## 2015-05-08 NOTE — Discharge Instructions (Signed)
Abdominal Pain, Adult °Many things can cause abdominal pain. Usually, abdominal pain is not caused by a disease and will improve without treatment. It can often be observed and treated at home. Your health care provider will do a physical exam and possibly order blood tests and X-rays to help determine the seriousness of your pain. However, in many cases, more time must pass before a clear cause of the pain can be found. Before that point, your health care provider may not know if you need more testing or further treatment. °HOME CARE INSTRUCTIONS °Monitor your abdominal pain for any changes. The following actions may help to alleviate any discomfort you are experiencing: °· Only take over-the-counter or prescription medicines as directed by your health care provider. °· Do not take laxatives unless directed to do so by your health care provider. °· Try a clear liquid diet (broth, tea, or water) as directed by your health care provider. Slowly move to a bland diet as tolerated. °SEEK MEDICAL CARE IF: °· You have unexplained abdominal pain. °· You have abdominal pain associated with nausea or diarrhea. °· You have pain when you urinate or have a bowel movement. °· You experience abdominal pain that wakes you in the night. °· You have abdominal pain that is worsened or improved by eating food. °· You have abdominal pain that is worsened with eating fatty foods. °· You have a fever. °SEEK IMMEDIATE MEDICAL CARE IF: °· Your pain does not go away within 2 hours. °· You keep throwing up (vomiting). °· Your pain is felt only in portions of the abdomen, such as the right side or the left lower portion of the abdomen. °· You pass bloody or black tarry stools. °MAKE SURE YOU: °· Understand these instructions. °· Will watch your condition. °· Will get help right away if you are not doing well or get worse. °  °This information is not intended to replace advice given to you by your health care provider. Make sure you discuss  any questions you have with your health care provider. °  °Document Released: 03/19/2005 Document Revised: 02/28/2015 Document Reviewed: 02/16/2013 °Elsevier Interactive Patient Education ©2016 Elsevier Inc[employer] ° °Colitis °Colitis is inflammation of the colon. Colitis may last a short time (acute) or it may last a long time (chronic). °CAUSES °This condition may be caused by: °· Viruses. °· Bacteria. °· Reactions to medicine. °· Certain autoimmune diseases, such as Crohn disease or ulcerative colitis. °SYMPTOMS °Symptoms of this condition include: °· Diarrhea. °· Passing bloody or tarry stool. °· Pain. °· Fever. °· Vomiting. °· Tiredness (fatigue). °· Weight loss. °· Bloating. °· Sudden increase in abdominal pain. °· Having fewer bowel movements than usual. °DIAGNOSIS °This condition is diagnosed with a stool test or a blood test. You may also have other tests, including X-rays, a CT scan, or a colonoscopy. °TREATMENT °Treatment may include: °· Resting the bowel. This involves not eating or drinking for a period of time. °· Fluids that are given through an IV tube. °· Medicine for pain and diarrhea. °· Antibiotic medicines. °· Cortisone medicines. °· Surgery. °HOME CARE INSTRUCTIONS °Eating and Drinking °· Follow instructions from your health care provider about eating or drinking restrictions. °· Drink enough fluid to keep your urine clear or pale yellow. °· Work with a dietitian to determine which foods cause your condition to flare up. °· Avoid foods that cause flare-ups. °· Eat a well-balanced diet. °Medicines °· Take over-the-counter and prescription medicines only as told by your health   care provider.  If you were prescribed an antibiotic medicine, take it as told by your health care provider. Do not stop taking the antibiotic even if you start to feel better. General Instructions  Keep all follow-up visits as told by your health care provider. This is important. SEEK MEDICAL CARE IF:  Your symptoms do  not go away.  You develop new symptoms. SEEK IMMEDIATE MEDICAL CARE IF:  You have a fever that does not go away with treatment.  You develop chills.  You have extreme weakness, fainting, or dehydration.  You have repeated vomiting.  You develop severe pain in your abdomen.  You pass bloody or tarry stool.   This information is not intended to replace advice given to you by your health care provider. Make sure you discuss any questions you have with your health care provider.   Document Released: 07/17/2004 Document Revised: 02/28/2015 Document Reviewed: 10/02/2014 Elsevier Interactive Patient Education 2016 Else[employer] Inc.  Diarrhea Diarrhea is frequent loose and watery bowel movements. It can cause you to feel weak and dehydrated. Dehydration can cause you to become tired and thirsty, have a dry mouth, and have decreased urination that often is dark yellow. Diarrhea is a sign of another problem, most often an infection that will not last long. In most cases, diarrhea typically lasts 2-3 days. However, it can last longer if it is a sign of something more serious. It is important to treat your diarrhea as directed by your caregiver to lessen or prevent future episodes of diarrhea. CAUSES  Some common causes include:  Gastrointestinal infections caused by viruses, bacteria, or parasites.  Food poisoning or food allergies.  Certain medicines, such as antibiotics, chemotherapy, and laxatives.  Artificial sweeteners and fructose.  Digestive disorders. HOME CARE INSTRUCTIONS  Ensure adequate fluid intake (hydration): Have 1 cup (8 oz) of fluid for each diarrhea episode. Avoid fluids that contain simple sugars or sports drinks, fruit juices, whole milk products, and sodas. Your urine should be clear or pale yellow if you are drinking enough fluids. Hydrate with an oral rehydration solution that you can purchase at pharmacies, retail stores, and online. You can prepare an oral  rehydration solution at home by mixing the following ingredients together:   - tsp table salt.   tsp baking soda.   tsp salt substitute containing potassium chloride.  1  tablespoons sugar.  1 L (34 oz) of water.  Certain foods and beverages may increase the speed at which food moves through the gastrointestinal (GI) tract. These foods and beverages should be avoided and include:  Caffeinated and alcoholic beverages.  High-fiber foods, such as raw fruits and vegetables, nuts, seeds, and whole grain breads and cereals.  Foods and beverages sweetened with sugar alcohols, such as xylitol, sorbitol, and mannitol.  Some foods may be well tolerated and may help thicken stool including:  Starchy foods, such as rice, toast, pasta, low-sugar cereal, oatmeal, grits, baked potatoes, crackers, and bagels.  Bananas.  Applesauce.  Add probiotic-rich foods to help increase healthy bacteria in the GI tract, such as yogurt and fermented milk products.  Wash your hands well after each diarrhea episode.  Only take over-the-counter or prescription medicines as directed by your caregiver.  Take a warm bath to relieve any burning or pain from frequent diarrhea episodes. SEEK IMMEDIATE MEDICAL CARE IF:   You are unable to keep fluids down.  You have persistent vomiting.  You have blood in your stool, or your stools are black and tarry.  You do not urinate in 6-8 hours, or there is only a small amount of very dark urine.  You have abdominal pain that increases or localizes.  You have weakness, dizziness, confusion, or light-headedness.  You have a severe headache.  Your diarrhea gets worse or does not get better.  You have a fever or persistent symptoms for more than 2-3 days.  You have a fever and your symptoms suddenly get worse. MAKE SURE YOU:   Understand these instructions.  Will watch your condition.  Will get help right away if you are not doing well or get worse.     This information is not intended to replace advice given to you by your health care provider. Make sure you discuss any questions you have with your health care provider.   Document Released: 05/30/2002 Document Revised: 06/30/2014 Document Reviewed: 02/15/2012 Elsevier Interactive Patient Education 2016 Elsevier Inc.  Nausea and Vomiting Nausea is a sick feeling that often comes before throwing up (vomiting). Vomiting is a reflex where stomach contents come out of your mouth. Vomiting can cause severe loss of body fluids (dehydration). Children and elderly adults can become dehydrated quickly, especially if they also have diarrhea. Nausea and vomiting are symptoms of a condition or disease. It is important to find the cause of your symptoms. CAUSES   Direct irritation of the stomach lining. This irritation can result from increased acid production (gastroesophageal reflux disease), infection, food poisoning, taking certain medicines (such as nonsteroidal anti-inflammatory drugs), alcohol use, or tobacco use.  Signals from the brain.These signals could be caused by a headache, heat exposure, an inner ear disturbance, increased pressure in the brain from injury, infection, a tumor, or a concussion, pain, emotional stimulus, or metabolic problems.  An obstruction in the gastrointestinal tract (bowel obstruction).  Illnesses such as diabetes, hepatitis, gallbladder problems, appendicitis, kidney problems, cancer, sepsis, atypical symptoms of a heart attack, or eating disorders.  Medical treatments such as chemotherapy and radiation.  Receiving medicine that makes you sleep (general anesthetic) during surgery. DIAGNOSIS Your caregiver may ask for tests to be done if the problems do not improve after a few days. Tests may also be done if symptoms are severe or if the reason for the nausea and vomiting is not clear. Tests may include:  Urine tests.  Blood tests.  Stool tests.  Cultures (to  look for evidence of infection).  X-rays or other imaging studies. Test results can help your caregiver make decisions about treatment or the need for additional tests. TREATMENT You need to stay well hydrated. Drink frequently but in small amounts.You may wish to drink water, sports drinks, clear broth, or eat frozen ice pops or gelatin dessert to help stay hydrated.When you eat, eating slowly may help prevent nausea.There are also some antinausea medicines that may help prevent nausea. HOME CARE INSTRUCTIONS   Take all medicine as directed by your caregiver.  If you do not have an appetite, do not force yourself to eat. However, you must continue to drink fluids.  If you have an appetite, eat a normal diet unless your caregiver tells you differently.  Eat a variety of complex carbohydrates (rice, wheat, potatoes, bread), lean meats, yogurt, fruits, and vegetables.  Avoid high-fat foods because they are more difficult to digest.  Drink enough water and fluids to keep your urine clear or pale yellow.  If you are dehydrated, ask your caregiver for specific rehydration instructions. Signs of dehydration may include:  Severe thirst.  Dry lips and  mouth.  Dizziness.  Dark urine.  Decreasing urine frequency and amount.  Confusion.  Rapid breathing or pulse. SEEK IMMEDIATE MEDICAL CARE IF:   You have blood or brown flecks (like coffee grounds) in your vomit.  You have black or bloody stools.  You have a severe headache or stiff neck.  You are confused.  You have severe abdominal pain.  You have chest pain or trouble breathing.  You do not urinate at least once every 8 hours.  You develop cold or clammy skin.  You continue to vomit for longer than 24 to 48 hours.  You have a fever. MAKE SURE YOU:   Understand these instructions.  Will watch your condition.  Will get help right away if you are not doing well or get worse.   This information is not intended  to replace advice given to you by your health care provider. Make sure you discuss any questions you have with your health care provider.   Document Released: 06/09/2005 Document Revised: 09/01/2011 Document Reviewed: 11/06/2010 Elsevier Interactive Patient Education [employer]2016 Elsevier Inc.

## 2015-05-08 NOTE — ED Provider Notes (Signed)
[facility] Emergency Department Provider Note  ____________________________________________  Time seen: Approximately 541 AM  I have reviewed the triage vital signs and the nursing notes.   HISTORY  Chief Complaint Abdominal Pain    HPI Lynn Larson is a 49 y.o. female who comes in today with vomiting and diarrhea. The patient reports that the symptoms started around midnight. She reports that her stomach did not feel right before she went to bed. She reports that initially she was vomiting food and then stomach acid and now it looks like she has some blood in her emesis. The patient reports that her pain is in the epigastric part of her abdomen. She reports that she has had this before and has been admitted in the past. She denies a history of cyclic vomiting but reports that when she starts vomiting she just continues to go and is unable to control the vomiting. The patient rates her pain a 10 out of 10 in intensity. She reports that she's had 3 episodes of liquid brown diarrhea as well. She is unable to determine how many times that she is vomiting. The patient did not take any medications at home and reports that Phenergan typically works for her. She denies any new foods or any sick contacts.   Past Medical History  Diagnosis Date  . Hypertension   . Asthma     Patient Active Problem List   Diagnosis Date Noted  . Nausea & vomiting 02/15/2015    Past Surgical History  Procedure Laterality Date  . Cholecystectomy      Current Outpatient Rx  Name  Route  Sig  Dispense  Refill  . amoxicillin-clavulanate (AUGMENTIN) 875-125 MG tablet   Oral   Take 1 tablet by mouth 2 (two) times daily. Patient not taking: Reported on 05/08/2015   20 tablet   0   . ciprofloxacin (CIPRO) 500 MG tablet   Oral   Take 1 tablet (500 mg total) by mouth 2 (two) times daily.   20 tablet   0   . metroNIDAZOLE (FLAGYL) 500 MG tablet   Oral   Take 1 tablet (500 mg  total) by mouth 3 (three) times daily. Patient not taking: Reported on 05/08/2015   42 tablet   0   . metroNIDAZOLE (FLAGYL) 500 MG tablet   Oral   Take 1 tablet (500 mg total) by mouth 2 (two) times daily.   14 tablet   0   . oxyCODONE-acetaminophen (ROXICET) 5-325 MG tablet   Oral   Take 1 tablet by mouth every 6 (six) hours as needed. Patient not taking: Reported on 05/08/2015   6 tablet   0   . oxyCODONE-acetaminophen (ROXICET) 5-325 MG tablet   Oral   Take 1 tablet by mouth every 6 (six) hours as needed.   7 tablet   0   . promethazine (PHENERGAN) 25 MG suppository   Rectal   Place 1 suppository (25 mg total) rectally every 6 (six) hours as needed for nausea.   12 suppository   0   . sucralfate (CARAFATE) 1 G tablet   Oral   Take 1 tablet (1 g total) by mouth 4 (four) times daily. Patient not taking: Reported on 05/08/2015   40 tablet   0     Allergies Morphine and related  No family history on file.  Social History Social History  Substance Use Topics  . Smoking status: Current Every Day Smoker -- 0.50 packs/day  Types: Cigarettes  . Smokeless tobacco: None  . Alcohol Use: No    Review of Systems Constitutional: No fever/chills Eyes: No visual changes. ENT: No sore throat. Cardiovascular: Denies chest pain. Respiratory: Denies shortness of breath. Gastrointestinal:  abdominal pain.  nausea, vomiting.  diarrhea.  No constipation. Genitourinary: Negative for dysuria. Musculoskeletal: Negative for back pain. Skin: Negative for rash. Neurological: Negative for headaches, focal weakness or numbness.  10-point ROS otherwise negative.  ____________________________________________   PHYSICAL EXAM:  VITAL SIGNS: ED Triage Vitals  Enc Vitals Group     BP 05/08/15 0406 189/120 mmHg     Pulse Rate 05/08/15 0406 79     Resp 05/08/15 0406 20     Temp 05/08/15 0406 98.9 F (37.2 C)     Temp Source 05/08/15 0406 Oral     SpO2 05/08/15 0406 100  %     Weight 05/08/15 0406 170 lb (77.111 kg)     Height 05/08/15 0406  (1.575 m)     Head Cir --      Peak Flow --      Pain Score 05/08/15 0406 10     Pain Loc --      Pain Edu? --      Excl. in GC? --     Constitutional: Alert and oriented. Ill appearing and in moderate distress. Eyes: Conjunctivae are normal. PERRL. EOMI. Head: Atraumatic. Nose: No congestion/rhinnorhea. Mouth/Throat: Mucous membranes are moist.  Oropharynx non-erythematous. Cardiovascular: Normal rate, regular rhythm. Grossly normal heart sounds.  Good peripheral circulation. Respiratory: Normal respiratory effort.  No retractions. Lungs CTAB. Gastrointestinal: Soft with upper abdomen tenderness to palpation. No distention. Mildly decreased bowel sounds Musculoskeletal: No lower extremity tenderness nor edema.   Neurologic:  Normal speech and language.  Skin:  Skin is warm, dry and intact.  Psychiatric: Mood and affect are normal. Speech and behavior are normal.  ____________________________________________   LABS (all labs ordered are listed, but only abnormal results are displayed)  Labs Reviewed  COMPREHENSIVE METABOLIC PANEL - Abnormal; Notable for the following:    Glucose, Bld 120 (*)    Total Protein 8.5 (*)    All other components within normal limits  CBC - Abnormal; Notable for the following:    WBC 21.4 (*)    RBC 5.55 (*)    HCT 47.5 (*)    All other components within normal limits  LIPASE, BLOOD   ____________________________________________  EKG  None ____________________________________________  RADIOLOGY  CT abdomen and pelvis: Stable diffuse colonic wall thickening of unknown significance, hiatal hernia. ____________________________________________   PROCEDURES  Procedure(s) performed: None  Critical Care performed: No  ____________________________________________   INITIAL IMPRESSION / ASSESSMENT AND PLAN / ED COURSE  Pertinent labs & imaging results that  were available during my care of the patient were reviewed by me and considered in my medical decision making (see chart for details).  This is a 49 year old female who comes in today with some abdominal pain vomiting and diarrhea. When the patient was initially in triage she did have fentanyl ordered for her pain and did receive Zofran 8 ODT. The patient also received a dose of Reglan 10 mg IV 1 and a liter of normal saline. When the patient came back to the room she received another 100 g of fentanyl and Haldol 2.5 mg IV 1 for nausea and vomiting. The patient does have a white blood cell count of 21 which is concerning so I will do a CT scan of the  patient's abdomen to evaluate for acute disease.  It appears as though the patient has some colitis on her CT scan. I will give her a half a milligram of Dilaudid, ciprofloxacin and Flagyl and have her reassessed for discharge by Dr. Inocencio Homes. ____________________________________________   FINAL CLINICAL IMPRESSION(S) / ED DIAGNOSES  Final diagnoses:  Vomiting and diarrhea  Epigastric pain  Colitis      Rebecka Apley, MD 05/08/15 2308

## 2015-05-08 NOTE — ED Notes (Signed)
Patient presents with c/o diffuse abd pain with (+) N/V that began around MN. Patient reports that she has vomited countless times since symptoms started.

## 2015-05-08 NOTE — ED Notes (Signed)
Pt reports pain to right upper arm, redness and swelling noted. IV d/c, MD informed, warm compresses applied.

## 2015-05-08 NOTE — ED Notes (Signed)
MD with order for Morphine 2mg  IVP. RN to administer and checked allergies to find that patient was allergic. Patient reporting that it is "not an allergy", rather it "just gives me cherry face." Patient states, "Just give it to me anyway, but I am telling you that they have to normally give me Dilaudid." MD made aware - no new orders at this time.

## 2015-08-13 ENCOUNTER — Encounter: Payer: Self-pay | Admitting: Emergency Medicine

## 2015-08-13 ENCOUNTER — Emergency Department: Payer: Self-pay

## 2015-08-13 ENCOUNTER — Emergency Department
Admission: EM | Admit: 2015-08-13 | Discharge: 2015-08-13 | Disposition: A | Discharge status: Home / Self Care | Payer: Self-pay | Attending: Emergency Medicine | Admitting: Emergency Medicine

## 2015-08-13 DIAGNOSIS — F17210 Nicotine dependence, cigarettes, uncomplicated: Secondary | ICD-10-CM | POA: Insufficient documentation

## 2015-08-13 DIAGNOSIS — I10 Essential (primary) hypertension: Secondary | ICD-10-CM | POA: Insufficient documentation

## 2015-08-13 DIAGNOSIS — R1084 Generalized abdominal pain: Secondary | ICD-10-CM | POA: Insufficient documentation

## 2015-08-13 DIAGNOSIS — R112 Nausea with vomiting, unspecified: Secondary | ICD-10-CM | POA: Insufficient documentation

## 2015-08-13 DIAGNOSIS — R197 Diarrhea, unspecified: Secondary | ICD-10-CM | POA: Insufficient documentation

## 2015-08-13 DIAGNOSIS — R1110 Vomiting, unspecified: Secondary | ICD-10-CM

## 2015-08-13 LAB — CBC WITH DIFFERENTIAL/PLATELET
Basophils Absolute: 0.1 10*3/uL (ref 0–0.1)
Basophils Relative: 1 %
EOS PCT: 1 %
Eosinophils Absolute: 0.1 10*3/uL (ref 0–0.7)
HCT: 45.1 % (ref 35.0–47.0)
Hemoglobin: 15.1 g/dL (ref 12.0–16.0)
LYMPHS ABS: 1.7 10*3/uL (ref 1.0–3.6)
LYMPHS PCT: 10 %
MCH: 28.6 pg (ref 26.0–34.0)
MCHC: 33.4 g/dL (ref 32.0–36.0)
MCV: 85.8 fL (ref 80.0–100.0)
MONO ABS: 0.6 10*3/uL (ref 0.2–0.9)
MONOS PCT: 4 %
Neutro Abs: 14 10*3/uL — ABNORMAL HIGH (ref 1.4–6.5)
Neutrophils Relative %: 84 %
PLATELETS: 347 10*3/uL (ref 150–440)
RBC: 5.26 MIL/uL — AB (ref 3.80–5.20)
RDW: 14.6 % — ABNORMAL HIGH (ref 11.5–14.5)
WBC: 16.4 10*3/uL — AB (ref 3.6–11.0)

## 2015-08-13 LAB — COMPREHENSIVE METABOLIC PANEL
ALK PHOS: 90 U/L (ref 38–126)
ALT: 28 U/L (ref 14–54)
ANION GAP: 9 (ref 5–15)
AST: 23 U/L (ref 15–41)
Albumin: 4.8 g/dL (ref 3.5–5.0)
BUN: 13 mg/dL (ref 6–20)
CALCIUM: 9.7 mg/dL (ref 8.9–10.3)
CO2: 23 mmol/L (ref 22–32)
Chloride: 109 mmol/L (ref 101–111)
Creatinine, Ser: 0.58 mg/dL (ref 0.44–1.00)
GLUCOSE: 120 mg/dL — AB (ref 65–99)
POTASSIUM: 3.5 mmol/L (ref 3.5–5.1)
Sodium: 141 mmol/L (ref 135–145)
TOTAL PROTEIN: 8.4 g/dL — AB (ref 6.5–8.1)
Total Bilirubin: 1 mg/dL (ref 0.3–1.2)

## 2015-08-13 LAB — LIPASE, BLOOD: LIPASE: 22 U/L (ref 11–51)

## 2015-08-13 MED ORDER — PROMETHAZINE HCL 25 MG PO TABS
25.0000 mg | ORAL_TABLET | Freq: Once | ORAL | Status: AC
  Administered 2015-08-13: 25 mg via ORAL
  Filled 2015-08-13: qty 1

## 2015-08-13 MED ORDER — ONDANSETRON HCL 4 MG/2ML IJ SOLN
4.0000 mg | Freq: Once | INTRAMUSCULAR | Status: AC
  Administered 2015-08-13: 4 mg via INTRAVENOUS

## 2015-08-13 MED ORDER — HYDROMORPHONE HCL 1 MG/ML IJ SOLN
1.0000 mg | Freq: Once | INTRAMUSCULAR | Status: AC
  Administered 2015-08-13: 1 mg via INTRAVENOUS
  Filled 2015-08-13: qty 1

## 2015-08-13 MED ORDER — METOCLOPRAMIDE HCL 5 MG/ML IJ SOLN
INTRAMUSCULAR | Status: AC
  Administered 2015-08-13: 20 mg
  Filled 2015-08-13: qty 4

## 2015-08-13 MED ORDER — RANITIDINE HCL 150 MG/10ML PO SYRP
150.0000 mg | ORAL_SOLUTION | Freq: Once | ORAL | Status: AC
  Administered 2015-08-13: 150 mg via ORAL
  Filled 2015-08-13: qty 10

## 2015-08-13 MED ORDER — METOCLOPRAMIDE HCL 5 MG/ML IJ SOLN
20.0000 mg | Freq: Once | INTRAVENOUS | Status: DC
  Filled 2015-08-13: qty 4

## 2015-08-13 MED ORDER — IOHEXOL 240 MG/ML SOLN
25.0000 mL | Freq: Once | INTRAMUSCULAR | Status: AC | PRN
  Administered 2015-08-13: 25 mL via ORAL

## 2015-08-13 MED ORDER — HYDROCODONE-ACETAMINOPHEN 5-325 MG PO TABS
1.0000 | ORAL_TABLET | Freq: Once | ORAL | Status: AC
  Administered 2015-08-13: 1 via ORAL
  Filled 2015-08-13: qty 1

## 2015-08-13 NOTE — Discharge Instructions (Signed)
Abdominal Pain, Adult Many things can cause abdominal pain. Usually, abdominal pain is not caused by a disease and will improve without treatment. It can often be observed and treated at home. Your health care provider will do a physical exam and possibly order blood tests and X-rays to help determine the seriousness of your pain. However, in many cases, more time must pass before a clear cause of the pain can be found. Before that point, your health care provider may not know if you need more testing or further treatment. HOME CARE INSTRUCTIONS Monitor your abdominal pain for any changes. The following actions may help to alleviate any discomfort you are experiencing:  Only take over-the-counter or prescription medicines as directed by your health care provider.  Do not take laxatives unless directed to do so by your health care provider.  Try a clear liquid diet (broth, tea, or water) as directed by your health care provider. Slowly move to a bland diet as tolerated. SEEK MEDICAL CARE IF:  You have unexplained abdominal pain.  You have abdominal pain associated with nausea or diarrhea.  You have pain when you urinate or have a bowel movement.  You experience abdominal pain that wakes you in the night.  You have abdominal pain that is worsened or improved by eating food.  You have abdominal pain that is worsened with eating fatty foods.  You have a fever. SEEK IMMEDIATE MEDICAL CARE IF:  Your pain does not go away within 2 hours.  You keep throwing up (vomiting).  Your pain is felt only in portions of the abdomen, such as the right side or the left lower portion of the abdomen.  You pass bloody or black tarry stools. MAKE SURE YOU:  Understand these instructions.  Will watch your condition.  Will get help right away if you are not doing well or get worse.   This information is not intended to replace advice given to you by your health care provider. Make sure you discuss  any questions you have with your health care provider.   Document Released: 03/19/2005 Document Revised: 02/28/2015 Document Reviewed: 02/16/2013 Elsevier Interactive Patient Education [employer]   Please return if you get worse again. Especially if she cannot keep down any fluids. Please follow up with [facility] or [facility] or [employer] [facility].

## 2015-08-13 NOTE — ED Provider Notes (Signed)
Patient's CT shows nothing patient reports herself that she's had this multiple times in the past and no one is ever been no diagnosis. Her white count is up a little bit but less so than previous episode. I will let her go with a little bit of pain medicine some Zantac and some Phenergan. She will follow-up with [facility] most likely her husband is going there. Or she will return here if she gets worse.  Arnaldo Natal, MD 08/13/15 [phone]

## 2015-08-13 NOTE — ED Notes (Signed)
Pt to ED via EMS from home c/o abd pain starting early this morning.  4 zofran given IM per EMS.  Pt states n/v/d, states stabbing pain to mid abd.   States hx of similar, unsure of previous diagnosis.  Pt A&Ox4.

## 2015-08-13 NOTE — ED Provider Notes (Signed)
[facility] Emergency Department Provider Note  ____________________________________________  Time seen: 1:50 PM  I have reviewed the triage vital signs and the nursing notes.   HISTORY  Chief Complaint Abdominal Pain      HPI Lynn Larson is a 50 y.o. female presents with generalized abdominal pain with onset early this morning and is currently 10 out of 10. Patient also admits to nausea vomiting and diarrhea. Patient admits to previous episodes of the same but unsure of the diagnosis that she was given. Patient denies any fever.   Past Medical History  Diagnosis Date  . Hypertension   . Asthma     Patient Active Problem List   Diagnosis Date Noted  . Nausea & vomiting 02/15/2015    Past Surgical History  Procedure Laterality Date  . Cholecystectomy      Current Outpatient Rx  Name  Route  Sig  Dispense  Refill  . amoxicillin-clavulanate (AUGMENTIN) 875-125 MG tablet   Oral   Take 1 tablet by mouth 2 (two) times daily. Patient not taking: Reported on 05/08/2015   20 tablet   0   . metroNIDAZOLE (FLAGYL) 500 MG tablet   Oral   Take 1 tablet (500 mg total) by mouth 3 (three) times daily. Patient not taking: Reported on 05/08/2015   42 tablet   0   . oxyCODONE-acetaminophen (ROXICET) 5-325 MG tablet   Oral   Take 1 tablet by mouth every 6 (six) hours as needed. Patient not taking: Reported on 05/08/2015   6 tablet   0   . oxyCODONE-acetaminophen (ROXICET) 5-325 MG tablet   Oral   Take 1 tablet by mouth every 6 (six) hours as needed.   7 tablet   0   . promethazine (PHENERGAN) 25 MG suppository   Rectal   Place 1 suppository (25 mg total) rectally every 6 (six) hours as needed for nausea.   12 suppository   0   . sucralfate (CARAFATE) 1 G tablet   Oral   Take 1 tablet (1 g total) by mouth 4 (four) times daily. Patient not taking: Reported on 05/08/2015   40 tablet   0     Allergies Morphine and related  History  reviewed. No pertinent family history.  Social History Social History  Substance Use Topics  . Smoking status: Current Every Day Smoker -- 0.50 packs/day    Types: Cigarettes  . Smokeless tobacco: None  . Alcohol Use: No    Review of Systems  Constitutional: Negative for fever. Eyes: Negative for visual changes. ENT: Negative for sore throat. Cardiovascular: Negative for chest pain. Respiratory: Negative for shortness of breath. Gastrointestinal: As for abdominal pain and vomiting Genitourinary: Negative for dysuria. Musculoskeletal: Negative for back pain. Skin: Negative for rash. Neurological: Negative for headaches, focal weakness or numbness.   10-point ROS otherwise negative.  ____________________________________________   PHYSICAL EXAM:  VITAL SIGNS: ED Triage Vitals  Enc Vitals Group     BP 08/13/15 1349 134/114 mmHg     Pulse Rate 08/13/15 1349 86     Resp 08/13/15 1349 24     Temp 08/13/15 1349 98.1 F (36.7 C)     Temp Source 08/13/15 1349 Oral     SpO2 08/13/15 1349 100 %     Weight 08/13/15 1349 170 lb (77.111 kg)     Height 08/13/15 1349  (1.626 m)     Head Cir --      Peak Flow --  Pain Score 08/13/15 1352 10     Pain Loc --      Pain Edu? --      Excl. in GC? --      Constitutional: Alert and oriented. Apparent discomfort Eyes: Conjunctivae are normal. PERRL. Normal extraocular movements. ENT   Head: Normocephalic and atraumatic.   Nose: No congestion/rhinnorhea.   Mouth/Throat: Mucous membranes are moist.   Neck: No stridor. Hematological/Lymphatic/Immunilogical: No cervical lymphadenopathy. Cardiovascular: Normal rate, regular rhythm. Normal and symmetric distal pulses are present in all extremities. No murmurs, rubs, or gallops. Respiratory: Normal respiratory effort without tachypnea nor retractions. Breath sounds are clear and equal bilaterally. No wheezes/rales/rhonchi. Gastrointestinal: Soft and nontender. No  distention. There is no CVA tenderness. Genitourinary: deferred Musculoskeletal: Nontender with normal range of motion in all extremities. No joint effusions.  No lower extremity tenderness nor edema. Neurologic:  Normal speech and language. No gross focal neurologic deficits are appreciated. Speech is normal.  Skin:  Skin is warm, dry and intact. No rash noted. Psychiatric: Mood and affect are normal. Speech and behavior are normal. Patient exhibits appropriate insight and judgment.     EKG  ED ECG REPORT I, Maysen Bonsignore, [place], the attending physician, personally viewed and interpreted this ECG.   Date: 08/13/2015  EKG Time: 10:00 AM  Rate: 66  Rhythm: Normal sinus rhythm with a right bundle-branch block  Axis: Normal  Intervals: Normal  ST&T Change: None     INITIAL IMPRESSION / ASSESSMENT AND PLAN / ED COURSE  Pertinent labs & imaging results that were available during my care of the patient were reviewed by me and considered in my medical decision making (see chart for details).  Patient received IV Dilaudid and 8 mg of Zofran on presentation for GC and antiemetic respectively. Patient continued to complain of persistent nausea as such Reglan 20 mg was given. CT scan of the abdomen pending patient's care transferred to Dr. Dorothea Glassman ____________________________________________   FINAL CLINICAL IMPRESSION(S) / ED DIAGNOSES  Final diagnoses:  Nausea and vomiting, vomiting of unspecified type  Generalized abdominal pain      Darci Current, MD 08/13/15 1523

## 2015-08-14 ENCOUNTER — Inpatient Hospital Stay: Payer: Self-pay

## 2015-08-14 ENCOUNTER — Observation Stay
Admission: EM | Admit: 2015-08-14 | Discharge: 2015-08-17 | Disposition: A | Discharge status: Home / Self Care | Payer: Self-pay | Attending: Internal Medicine | Admitting: Internal Medicine

## 2015-08-14 ENCOUNTER — Emergency Department: Payer: Self-pay

## 2015-08-14 DIAGNOSIS — R748 Abnormal levels of other serum enzymes: Secondary | ICD-10-CM | POA: Insufficient documentation

## 2015-08-14 DIAGNOSIS — K257 Chronic gastric ulcer without hemorrhage or perforation: Principal | ICD-10-CM | POA: Insufficient documentation

## 2015-08-14 DIAGNOSIS — Z888 Allergy status to other drugs, medicaments and biological substances status: Secondary | ICD-10-CM | POA: Insufficient documentation

## 2015-08-14 DIAGNOSIS — R1013 Epigastric pain: Secondary | ICD-10-CM

## 2015-08-14 DIAGNOSIS — K922 Gastrointestinal hemorrhage, unspecified: Secondary | ICD-10-CM

## 2015-08-14 DIAGNOSIS — Z79899 Other long term (current) drug therapy: Secondary | ICD-10-CM | POA: Insufficient documentation

## 2015-08-14 DIAGNOSIS — K319 Disease of stomach and duodenum, unspecified: Secondary | ICD-10-CM | POA: Insufficient documentation

## 2015-08-14 DIAGNOSIS — J45909 Unspecified asthma, uncomplicated: Secondary | ICD-10-CM | POA: Insufficient documentation

## 2015-08-14 DIAGNOSIS — R197 Diarrhea, unspecified: Secondary | ICD-10-CM | POA: Insufficient documentation

## 2015-08-14 DIAGNOSIS — Z95828 Presence of other vascular implants and grafts: Secondary | ICD-10-CM

## 2015-08-14 DIAGNOSIS — K449 Diaphragmatic hernia without obstruction or gangrene: Secondary | ICD-10-CM | POA: Insufficient documentation

## 2015-08-14 DIAGNOSIS — I10 Essential (primary) hypertension: Secondary | ICD-10-CM | POA: Insufficient documentation

## 2015-08-14 DIAGNOSIS — Z79891 Long term (current) use of opiate analgesic: Secondary | ICD-10-CM | POA: Insufficient documentation

## 2015-08-14 DIAGNOSIS — E876 Hypokalemia: Secondary | ICD-10-CM | POA: Insufficient documentation

## 2015-08-14 DIAGNOSIS — K920 Hematemesis: Secondary | ICD-10-CM | POA: Diagnosis present

## 2015-08-14 DIAGNOSIS — R112 Nausea with vomiting, unspecified: Secondary | ICD-10-CM

## 2015-08-14 DIAGNOSIS — Z9049 Acquired absence of other specified parts of digestive tract: Secondary | ICD-10-CM | POA: Insufficient documentation

## 2015-08-14 DIAGNOSIS — F17210 Nicotine dependence, cigarettes, uncomplicated: Secondary | ICD-10-CM | POA: Insufficient documentation

## 2015-08-14 DIAGNOSIS — Z885 Allergy status to narcotic agent status: Secondary | ICD-10-CM | POA: Insufficient documentation

## 2015-08-14 LAB — CBC WITH DIFFERENTIAL/PLATELET
BASOS PCT: 0 %
Basophils Absolute: 0.1 10*3/uL (ref 0–0.1)
Eosinophils Absolute: 0.1 10*3/uL (ref 0–0.7)
Eosinophils Relative: 1 %
HEMATOCRIT: 44 % (ref 35.0–47.0)
HEMOGLOBIN: 14.8 g/dL (ref 12.0–16.0)
LYMPHS ABS: 1.7 10*3/uL (ref 1.0–3.6)
Lymphocytes Relative: 10 %
MCH: 28.6 pg (ref 26.0–34.0)
MCHC: 33.6 g/dL (ref 32.0–36.0)
MCV: 85.3 fL (ref 80.0–100.0)
MONOS PCT: 3 %
Monocytes Absolute: 0.6 10*3/uL (ref 0.2–0.9)
NEUTROS ABS: 14.7 10*3/uL — AB (ref 1.4–6.5)
NEUTROS PCT: 86 %
Platelets: 374 10*3/uL (ref 150–440)
RBC: 5.16 MIL/uL (ref 3.80–5.20)
RDW: 15.1 % — ABNORMAL HIGH (ref 11.5–14.5)
WBC: 17.1 10*3/uL — ABNORMAL HIGH (ref 3.6–11.0)

## 2015-08-14 LAB — COMPREHENSIVE METABOLIC PANEL
ALBUMIN: 5 g/dL (ref 3.5–5.0)
ALK PHOS: 87 U/L (ref 38–126)
ALT: 27 U/L (ref 14–54)
ANION GAP: 13 (ref 5–15)
AST: 23 U/L (ref 15–41)
BILIRUBIN TOTAL: 1 mg/dL (ref 0.3–1.2)
BUN: 19 mg/dL (ref 6–20)
CALCIUM: 10 mg/dL (ref 8.9–10.3)
CO2: 23 mmol/L (ref 22–32)
CREATININE: 0.68 mg/dL (ref 0.44–1.00)
Chloride: 104 mmol/L (ref 101–111)
GFR calc Af Amer: >60 mL/min (ref >60)
GFR calc non Af Amer: >60 mL/min (ref >60)
GLUCOSE: 142 mg/dL — AB (ref 65–99)
Potassium: 3.4 mmol/L — ABNORMAL LOW (ref 3.5–5.1)
Sodium: 140 mmol/L (ref 135–145)
TOTAL PROTEIN: 8.4 g/dL — AB (ref 6.5–8.1)

## 2015-08-14 LAB — HEMOGLOBIN AND HEMATOCRIT, BLOOD
HCT: 43.9 % (ref 35.0–47.0)
HEMATOCRIT: 39.8 % (ref 35.0–47.0)
HEMOGLOBIN: 14.6 g/dL (ref 12.0–16.0)
Hemoglobin: 13.4 g/dL (ref 12.0–16.0)

## 2015-08-14 LAB — TYPE AND SCREEN
ABO/RH(D): AB POS
Antibody Screen: NEGATIVE

## 2015-08-14 LAB — ABO/RH: ABO/RH(D): AB POS

## 2015-08-14 LAB — TROPONIN I: Troponin I: 0.05 ng/mL — ABNORMAL HIGH (ref <0.031)

## 2015-08-14 LAB — LIPASE, BLOOD: Lipase: 24 U/L (ref 11–51)

## 2015-08-14 MED ORDER — PANTOPRAZOLE SODIUM 40 MG IV SOLR
40.0000 mg | Freq: Two times a day (BID) | INTRAVENOUS | Status: DC
Start: 2015-08-18 — End: 2015-08-16

## 2015-08-14 MED ORDER — ONDANSETRON HCL 4 MG/2ML IJ SOLN
4.0000 mg | Freq: Once | INTRAMUSCULAR | Status: DC
  Filled 2015-08-14: qty 2

## 2015-08-14 MED ORDER — ACETAMINOPHEN 650 MG RE SUPP
650.0000 mg | Freq: Four times a day (QID) | RECTAL | Status: DC | PRN
Start: 2015-08-14 — End: 2015-08-17

## 2015-08-14 MED ORDER — ACETAMINOPHEN 325 MG PO TABS: 650.0000 mg | ORAL_TABLET | Freq: Four times a day (QID) | ORAL | Status: DC | PRN

## 2015-08-14 MED ORDER — SODIUM CHLORIDE 0.9 % IV SOLN
8.0000 mg/h | INTRAVENOUS | Status: DC
  Administered 2015-08-14 – 2015-08-15 (×4): 8 mg/h via INTRAVENOUS
  Filled 2015-08-14 (×5): qty 80

## 2015-08-14 MED ORDER — FENTANYL CITRATE (PF) 100 MCG/2ML IJ SOLN
INTRAMUSCULAR | Status: AC
  Administered 2015-08-14: 50 ug via INTRAVENOUS
  Filled 2015-08-14: qty 2

## 2015-08-14 MED ORDER — NICOTINE 14 MG/24HR TD PT24
14.0000 mg | MEDICATED_PATCH | Freq: Every day | TRANSDERMAL | Status: DC
  Administered 2015-08-14 – 2015-08-17 (×4): 14 mg via TRANSDERMAL
  Filled 2015-08-14 (×4): qty 1

## 2015-08-14 MED ORDER — SODIUM CHLORIDE 0.9 % IV SOLN
INTRAVENOUS | Status: DC
  Administered 2015-08-14 – 2015-08-16 (×4): via INTRAVENOUS

## 2015-08-14 MED ORDER — HYDROMORPHONE HCL 1 MG/ML IJ SOLN
1.0000 mg | INTRAMUSCULAR | Status: AC
  Administered 2015-08-14: 1 mg via INTRAVENOUS
  Filled 2015-08-14: qty 1

## 2015-08-14 MED ORDER — ONDANSETRON HCL 4 MG PO TABS: 4.0000 mg | ORAL_TABLET | Freq: Four times a day (QID) | ORAL | Status: DC | PRN

## 2015-08-14 MED ORDER — ONDANSETRON HCL 4 MG/2ML IJ SOLN
INTRAMUSCULAR | Status: AC
  Administered 2015-08-14: 4 mg via INTRAVENOUS
  Filled 2015-08-14: qty 2

## 2015-08-14 MED ORDER — ONDANSETRON HCL 4 MG/2ML IJ SOLN
4.0000 mg | Freq: Once | INTRAMUSCULAR | Status: AC
  Administered 2015-08-14: 4 mg via INTRAVENOUS

## 2015-08-14 MED ORDER — DIPHENHYDRAMINE HCL 25 MG PO CAPS
25.0000 mg | ORAL_CAPSULE | Freq: Four times a day (QID) | ORAL | Status: DC | PRN
  Administered 2015-08-14 – 2015-08-16 (×5): 25 mg via ORAL
  Filled 2015-08-14 (×4): qty 1

## 2015-08-14 MED ORDER — PANTOPRAZOLE SODIUM 40 MG IV SOLR: 40.0000 mg | Freq: Two times a day (BID) | INTRAVENOUS | Status: DC

## 2015-08-14 MED ORDER — FENTANYL CITRATE (PF) 100 MCG/2ML IJ SOLN
50.0000 ug | Freq: Once | INTRAMUSCULAR | Status: AC
  Administered 2015-08-14: 50 ug via INTRAVENOUS

## 2015-08-14 MED ORDER — HYDROMORPHONE HCL 1 MG/ML IJ SOLN
INTRAMUSCULAR | Status: AC
  Administered 2015-08-14: 1 mg via INTRAVENOUS
  Filled 2015-08-14: qty 1

## 2015-08-14 MED ORDER — SODIUM CHLORIDE 0.9 % IV SOLN
80.0000 mg | Freq: Once | INTRAVENOUS | Status: AC
  Administered 2015-08-14: 80 mg via INTRAVENOUS
  Filled 2015-08-14: qty 80

## 2015-08-14 MED ORDER — MORPHINE SULFATE (PF) 2 MG/ML IV SOLN
1.0000 mg | INTRAVENOUS | Status: DC | PRN
  Administered 2015-08-14: 1 mg via INTRAVENOUS
  Filled 2015-08-14: qty 1

## 2015-08-14 MED ORDER — DIPHENHYDRAMINE HCL 25 MG PO CAPS
ORAL_CAPSULE | ORAL | Status: AC
  Filled 2015-08-14: qty 1

## 2015-08-14 MED ORDER — ONDANSETRON HCL 4 MG/2ML IJ SOLN
4.0000 mg | Freq: Four times a day (QID) | INTRAMUSCULAR | Status: DC | PRN
  Administered 2015-08-14 – 2015-08-17 (×9): 4 mg via INTRAVENOUS
  Filled 2015-08-14 (×10): qty 2

## 2015-08-14 MED ORDER — HYDROMORPHONE HCL 1 MG/ML IJ SOLN
1.0000 mg | INTRAMUSCULAR | Status: DC | PRN
  Administered 2015-08-14 – 2015-08-15 (×4): 1 mg via INTRAVENOUS
  Filled 2015-08-14 (×4): qty 1

## 2015-08-14 MED ORDER — SENNOSIDES-DOCUSATE SODIUM 8.6-50 MG PO TABS: 1.0000 | ORAL_TABLET | Freq: Every evening | ORAL | Status: DC | PRN

## 2015-08-14 MED ORDER — ALUM & MAG HYDROXIDE-SIMETH 200-200-20 MG/5ML PO SUSP: 30.0000 mL | Freq: Four times a day (QID) | ORAL | Status: DC | PRN

## 2015-08-14 MED ORDER — PROMETHAZINE HCL 25 MG/ML IJ SOLN
12.5000 mg | Freq: Once | INTRAMUSCULAR | Status: AC
  Administered 2015-08-14: 12.5 mg via INTRAVENOUS
  Filled 2015-08-14: qty 1

## 2015-08-14 MED ORDER — HYDROMORPHONE HCL 1 MG/ML IJ SOLN
1.0000 mg | INTRAMUSCULAR | Status: AC
  Administered 2015-08-14: 1 mg via INTRAVENOUS

## 2015-08-14 MED ORDER — HYDRALAZINE HCL 20 MG/ML IJ SOLN
10.0000 mg | Freq: Four times a day (QID) | INTRAMUSCULAR | Status: DC | PRN
  Administered 2015-08-15: 10 mg via INTRAVENOUS
  Filled 2015-08-14: qty 1

## 2015-08-14 MED ORDER — SODIUM CHLORIDE 0.9% FLUSH
3.0000 mL | Freq: Two times a day (BID) | INTRAVENOUS | Status: DC
  Administered 2015-08-14: 10 mL via INTRAVENOUS
  Administered 2015-08-15 – 2015-08-17 (×3): 3 mL via INTRAVENOUS

## 2015-08-14 NOTE — Progress Notes (Signed)
Pt admitted to floor via stretcher with a  GI bleed. Pt noted to have swollen eyes bilateral, redness and itching. She states she got Morphine in the ED and she is allergic. Called Dr mody received an order for Benadryl and a nicotine patch . Attempted times 3 to start another IV line pt is on a Protonix drip and needs a separate line for IV fluids. At 1845 pat started c/o pain no pain meds ordered called Dr Juliene Pina orders received.

## 2015-08-14 NOTE — ED Notes (Signed)
Pt arrived via EMS c/o abdominal pain. Pt was seen here yesterday (08/13/15) for abdominal pain, and was discharged home; pt returned to day c/o the same abdominal pain in addition to vomiting blood.

## 2015-08-14 NOTE — Progress Notes (Signed)
Pt with loss of iv access . rn spoke with dr mody who ordered picc line

## 2015-08-14 NOTE — Consult Note (Signed)
[facility] Inpatient Consult Note  Reason for Consult: abdominal pain, nausea, vomiting    Attending Requesting Consult: Lynn Larson  History of Present Illness: Lynn Larson is a 50 y.o. female seen for evaluation of abdominal pain, nausea, vomiting at the request of Lynn Larson.   She has been followed previously by Lynn Larson. She was last seen while in patient in 01/2015 when hospitalized for nausea/vomiting, epigastric pain. At that visit resolved w/ supportive care.   She reports a chronic history of N/V/epigastric pain. She has had symptoms for several years, but feels these are worse in the past 2 months, particularly acutely worse x 3 days. She reports chronic GERD, dysphagia, and odynophagia. She feels that substances are painful when eating and lodge in upper epigastric/lower esophageal area. She developed hematemesis earlier today which prompted ER visit. She has had vomiting multiple times since in the ER as well that is dark without gross blood. She describes severe epigastric pain currently 10/10 ongoing since yesterday. She has lost 10lbs in past 2 months due to overall not feeling well. She has stress related to taking care of her husband who is chronically ill. SHe is having 1-2 BM/day, soft stools without gross blood.    Last Colonoscopy: 05/2009 - Lynn Larson - internal non bleeding small hemorrhoids. Normal TI. Colon bx -benign lymphoid aggregate noted within colonic mucosa.     Last Endoscopy: 01/2011 Lynn Larson - localized moderate inflammation characterized by erosions and erythema in gastric antrum, duodenitis w/ prominent duodenal folds. Path w/ duodenal mucosa w/o pathologic abnormality.    Past Medical History:  Past Medical History  Diagnosis Date  . Hypertension   . Asthma     Problem List: Patient Active Problem List   Diagnosis Date Noted  . Hematemesis 08/14/2015  . Nausea & vomiting 02/15/2015    Past Surgical History: Past Surgical History  Procedure Laterality Date  .  Cholecystectomy      Allergies: Allergies  Allergen Reactions  . Morphine And Related Other (See Comments)    Reaction:  Redness of face     Home Medications:  (Not in a [facility] admission) Home medication reconciliation was completed with the patient.   Scheduled Inpatient Medications:   . [START ON 08/18/2015] pantoprazole (PROTONIX) IV  40 mg Intravenous Q12H    Continuous Inpatient Infusions:   . pantoprozole (PROTONIX) infusion 8 mg/hr (08/14/15 1327)    PRN Inpatient Medications:  morphine injection, ondansetron **OR** ondansetron (ZOFRAN) IV  Family History: family history is not on file.  The patient's family history is negative for inflammatory bowel disorders, [facility] malignancy, or solid organ transplantation.  Social History:   reports that she has been smoking Cigarettes.  She has been smoking about 0.50 packs per day. She does not have any smokeless tobacco history on file. She reports that she does not drink alcohol. She uses marijuana several times per week.     Review of Systems: Constitutional: + weight loss.  Eyes: No changes in vision. ENT: No oral lesions, sore throat.  Larson: see HPI.  Heme/Lymph: No easy bruising.  CV: No chest pain.  GU: No hematuria.  Integumentary: No rashes.  Neuro: No headaches.  Psych: No depression/anxiety.  Endocrine: No heat/cold intolerance.  Allergic/Immunologic: No urticaria.  Resp: No cough, SOB.  Musculoskeletal: No joint swelling.    Physical Examination: BP 142/72 mmHg  Pulse 70  Temp(Src) 98.3 F (36.8 C) (Oral)  Resp 21  Ht 5' 3.25" (1.607 m)  Wt 170 lb (  77.111 kg)  BMI 29.86 kg/m2  SpO2 96% Gen: NAD, alert and oriented x 4, crying, anxious  HEENT: PEERLA, EOMI, Neck: supple, no JVD or thyromegaly Chest: CTA bilaterally, no wheezes, crackles, or other adventitious sounds CV: RRR, no m/g/c/r Abd: soft, diffusely TTP in epigastric area Rectal-no stool in rectal vault, no obvious blood.  Ext: no edema,  well perfused with 2+ pulses, Skin: no rash or lesions noted Lymph: no LAD  Data: Lab Results  Component Value Date   WBC 17.1* 08/14/2015   HGB 14.8 08/14/2015   HCT 44.0 08/14/2015   MCV 85.3 08/14/2015   PLT 374 08/14/2015    Recent Labs Lab 08/13/15 1408 08/14/15 1221  HGB 15.1 14.8   Lab Results  Component Value Date   NA 140 08/14/2015   K 3.4* 08/14/2015   CL 104 08/14/2015   CO2 23 08/14/2015   BUN 19 08/14/2015   CREATININE 0.68 08/14/2015   Lab Results  Component Value Date   ALT 27 08/14/2015   AST 23 08/14/2015   ALKPHOS 87 08/14/2015   BILITOT 1.0 08/14/2015   No results for input(s): APTT, INR, PTT in the last 168 hours.    CT a/p IMPRESSION: 1. Small hiatal hernia. 2. No hydronephrosis or hydroureter. No nephrolithiasis. 3. No small bowel obstruction. 4. Normal appendix. No pericecal inflammation. 5. Stable diffuse mild thickening of colonic wall. No acute colitis or diverticulitis. 6. Mild atherosclerotic calcifications distal abdominal aorta and common iliac arteries.   Assessment/Plan: Lynn Larson is a 50 y.o. female admitted for nausea, vomiting, epigastric pain.   1. Nausea/vomiting - associated w/ acute episodes of severe abdominal pain. Denies any diarrhea with symptoms. Acute on chronic symptoms. Given her NSAID use (alka seltzer) she is at risk for gastritis, duodenitis, esophagitis, PUD. EGD tomorrow. Continue PPI and anti-emetics. Will need PPI as outpatient, she is currently taking Rolaids, Zantac, and Tums. Imaging as above.   2. Dysphagia/odynophagia - chronic dysphagia to solids in lower esophageal area and newer onset odynophagia likely from esophagitis 2/2 repetitive vomiting. EGD for eval.   3. Hematemesis - small amount, from repetitive vomiting. Anti-emetics PRN. EGD tomorrow as long as continues to be hemodynamically stable. Hgb 14.  4. Leukocytosis - WBC chronically elevated. Imaging as above. May need evaluation of  colonic wall thickening with colonoscopy (last 2010) as above as outpatient.    Recommendations:  1. Continue PPI gtt. 2. Continue NPO.  3. EGD tomorrow with Lynn Larson.  4. Anti-emetics PRN. 5. Monitor H/h q6 hours.   *Case discussed w/ Lynn Larson.   Thank you for the consult. Please call with questions or concerns.  Bluford Kaufmann, PA-C [facility]

## 2015-08-14 NOTE — H&P (Signed)
[facility] - [place] at [facility]   PATIENT NAME: Lynn [facility]    MR#:  [id number]  DATE OF BIRTH:  1965-07-21  DATE OF ADMISSION:  08/14/2015  PRIMARY CARE PHYSICIAN: No PCP Per Patient   REQUESTING/REFERRING PHYSICIAN Dr Silverio Lay  CHIEF COMPLAINT:   Nausea vomiting and hematemesis HISTORY OF PRESENT ILLNESS:  Lynn Larson  is a 50 y.o. female with a known history of  essential hypertension and ulcers who presents with above pain. Patient is seen in emergency room yesterday for abdominal pain. She had a CT scan of the abdomen which showed no acute pathology. She was sent home. She presents today with nausea vomiting and hematemesis. She had several episodes of hematemesis while in the emergency room. She is complaining of severe 10 out of 10 epigastric abdominal pain.  PAST MEDICAL HISTORY:   Past Medical History  Diagnosis Date  . Hypertension   . Asthma     PAST SURGICAL HISTORY:   Past Surgical History  Procedure Laterality Date  . Cholecystectomy      SOCIAL HISTORY:   Social History  Substance Use Topics  . Smoking status: Current Every Day Smoker -- 0.50 packs/day    Types: Cigarettes  . Smokeless tobacco: Not on file  . Alcohol Use: No    FAMILY HISTORY:  History reviewed. No pertinent family history.  DRUG ALLERGIES:   Allergies  Allergen Reactions  . Morphine And Related Other (See Comments)    Reaction:  Redness of face      REVIEW OF SYSTEMS:  CONSTITUTIONAL: No fever, fatigue or weakness.  EYES: No blurred or double vision.  EARS, NOSE, AND THROAT: No tinnitus or ear pain.  RESPIRATORY: No cough, shortness of breath, wheezing or hemoptysis.  CARDIOVASCULAR: No chest pain, orthopnea, edema.  GASTROINTESTINAL: Positive for nausea, vomiting, no diarrhea positive for epigastric abdominal pain.  GENITOURINARY: No dysuria, hematuria.  ENDOCRINE: No polyuria, nocturia,  HEMATOLOGY: No anemia, easy bruising or bleeding SKIN:  No rash or lesion. MUSCULOSKELETAL: No joint pain or arthritis.   NEUROLOGIC: No tingling, numbness, weakness.  PSYCHIATRY: No anxiety or depression.   MEDICATIONS AT HOME:   Prior to Admission medications   Medication Sig Start Date End Date Taking? Authorizing Provider  amoxicillin-clavulanate (AUGMENTIN) 875-125 MG tablet Take 1 tablet by mouth 2 (two) times daily. Patient not taking: Reported on 05/08/2015 04/16/15   Chinita Pester, FNP  metroNIDAZOLE (FLAGYL) 500 MG tablet Take 1 tablet (500 mg total) by mouth 3 (three) times daily. Patient not taking: Reported on 05/08/2015 02/18/15   Katha Hamming, MD  oxyCODONE-acetaminophen (ROXICET) 5-325 MG tablet Take 1 tablet by mouth every 6 (six) hours as needed. Patient not taking: Reported on 05/08/2015 04/16/15   Chinita Pester, FNP  oxyCODONE-acetaminophen (ROXICET) 5-325 MG tablet Take 1 tablet by mouth every 6 (six) hours as needed. Patient not taking: Reported on 08/14/2015 05/08/15   Rebecka Apley, MD  promethazine (PHENERGAN) 25 MG suppository Place 1 suppository (25 mg total) rectally every 6 (six) hours as needed for nausea. Patient not taking: Reported on 08/14/2015 05/08/15 05/07/16  Rebecka Apley, MD  sucralfate (CARAFATE) 1 G tablet Take 1 tablet (1 g total) by mouth 4 (four) times daily. Patient not taking: Reported on 05/08/2015 02/18/15   Katha Hamming, MD      VITAL SIGNS:  Blood pressure 178/103, pulse 88, temperature 98.3 F (36.8 C), temperature source Oral, resp. rate 18, height 5' 3.25" (1.607 m), weight 77.111  kg (170 lb), SpO2 100 %.  PHYSICAL EXAMINATION:  GENERAL:  50 y.o.-year-old patient lying in the bed with severe distress due to abdominal pain.  EYES: Pupils equal, round, reactive to light and accommodation. No scleral icterus. Extraocular muscles intact.  HEENT: Head atraumatic, normocephalic. Oropharynx and nasopharynx clear.  NECK:  Supple, no jugular venous distention. No thyroid  enlargement, no tenderness.  LUNGS: Normal breath sounds bilaterally, no wheezing, rales,rhonchi or crepitation. No use of accessory muscles of respiration.  CARDIOVASCULAR: S1, S2 normal. No murmurs, rubs, or gallops.  ABDOMEN: Patient has significant tenderness epigastric region without rebound or guarding bowel sounds are positive. No organomegaly or mass.  EXTREMITIES: No pedal edema, cyanosis, or clubbing.  NEUROLOGIC: Cranial nerves II through XII are grossly intact. No focal deficits. PSYCHIATRIC: The patient is alert and oriented x 3.  SKIN: No obvious rash, lesion, or ulcer.   LABORATORY PANEL:   CBC  Recent Labs Lab 08/14/15 1221  WBC 17.1*  HGB 14.8  HCT 44.0  PLT 374   ------------------------------------------------------------------------------------------------------------------  Chemistries   Recent Labs Lab 08/14/15 1221  NA 140  K 3.4*  CL 104  CO2 23  GLUCOSE 142*  BUN 19  CREATININE 0.68  CALCIUM 10.0  AST 23  ALT 27  ALKPHOS 87  BILITOT 1.0   ------------------------------------------------------------------------------------------------------------------  Cardiac Enzymes  Recent Labs Lab 08/14/15 1221  TROPONINI 0.05*   ------------------------------------------------------------------------------------------------------------------  RADIOLOGY:  Ct Abdomen Pelvis Wo Contrast  08/13/2015  CLINICAL DATA:  Diffuse abdominal pain starting this morning EXAM: CT ABDOMEN AND PELVIS WITHOUT CONTRAST TECHNIQUE: Multidetector CT imaging of the abdomen and pelvis was performed following the standard protocol without IV contrast. COMPARISON:  05/08/2015 FINDINGS: The lung bases are unremarkable. The study is limited without IV or oral contrast. Small hiatal hernia is noted. Sagittal images of the spine shows minimal degenerative changes lumbar spine. Unenhanced liver shows no biliary ductal dilatation. The patient is status post cholecystectomy.  Unenhanced pancreas, spleen and adrenal glands are unremarkable. No aortic aneurysm. Mild atherosclerotic calcifications of distal abdominal aorta and common iliac arteries. Unenhanced kidneys are symmetrical in size. No nephrolithiasis. No hydronephrosis or hydroureter. Normal appendix. No pericecal inflammation. The terminal ileum is unremarkable. No thickened or distended small bowel loops. Stable diffuse mild fatty thickening of colonic wall without change from prior exam. There is no evidence of acute colitis or diverticulitis. The uterus is anteflexed.  No adnexal mass. IMPRESSION: 1. Small hiatal hernia. 2. No hydronephrosis or hydroureter.  No nephrolithiasis. 3. No small bowel obstruction. 4. Normal appendix.  No pericecal inflammation. 5. Stable diffuse mild thickening of colonic wall. No acute colitis or diverticulitis. 6. Mild atherosclerotic calcifications distal abdominal aorta and common iliac arteries. Electronically Signed   By: Natasha Mead M.D.   On: 08/13/2015 15:12   Dg Chest 1 View  08/14/2015  CLINICAL DATA:  Abdominal pain EXAM: CHEST 1 VIEW COMPARISON:  02/15/2015 FINDINGS: Normal heart size. Lungs clear. No pneumothorax. No pleural effusion. IMPRESSION: No active disease. Electronically Signed   By: Jolaine Click M.D.   On: 08/14/2015 13:12    EKG:   Sinus arrhythmia no ST elevation or depression  IMPRESSION AND PLAN:   50 year old female with history of essential hypertension out of medications due to financial reasons he presents with midepigastric abdominal pain or hematemesis.  1. Upper GI bleed: Patient presents with hematemesis and midepigastric pain. Patient reports that she has a history of ulcer in the past. She denies melena. Continue IV twice  a day PPI. GI consult has been placed and discussed with GI physician on-call. Plan for EGD tomorrow if hemodynamically stable. Hemoglobin every 6 hours. Continue supportive care with pain medications and  antiemetics. Transfuse if hemoglobin less than 7. 2. History of essential hypertension: Patient has not been taking medications. Continue IV hydralazine when necessary.  3. Tobacco dependence:. She says she wants to stop smoking. Nicotine patch will be placed. The patient is counselled for 3 minutes.   4. Elevated troponin: Likely from fall with demand ischemia.  All the records are reviewed and case discussed with ED provider. Management plans discussed with the patient and she is in agreement. D/w Dr Marva Panda CODE STATUS: FULL Admit to step down due to critical illness due to GIB CRITICAL CARE TOTAL TIME TAKING CARE OF THIS PATIENT: 50 minutes.    Davien Malone M.D on 08/14/2015 at 2:07 PM  Between 7am to 6pm - Pager - [phone] After 6pm go to [email or web address] - password [email or web address] [facility]  [facility]  Office  [phone]  CC: Primary care physician; No PCP Per Patient

## 2015-08-14 NOTE — ED Provider Notes (Signed)
[facility] Emergency Department Provider Note  ____________________________________________  Time seen: Seen upon arrival to the emergency department  I have reviewed the triage vital signs and the nursing notes.   HISTORY  Chief Complaint Abdominal Pain    HPI Lynn Larson is a 50 y.o. female with a history of cyclic vomiting syndrome who is presenting today with upper abdominal pain as well as nausea vomiting and diarrhea. She says that she has had multiple episodes of this in the past. She was just seen at this ER one day ago and was found to have a negative CAT scan and was discharged home. The patient is presenting back this time with sharp epigastric pain which is nonradiating. She also says that she is having blood in her vomitus. She is also complaining of watery diarrhea. No known sick contacts. She denies any drinking or drug use. Also says that she was unable to fill the prescriptions that were prescribed to her when she was discharged after her last visit.Patient describes the pain as severe.   Past Medical History  Diagnosis Date  . Hypertension   . Asthma     Patient Active Problem List   Diagnosis Date Noted  . Nausea & vomiting 02/15/2015    Past Surgical History  Procedure Laterality Date  . Cholecystectomy      Current Outpatient Rx  Name  Route  Sig  Dispense  Refill  . amoxicillin-clavulanate (AUGMENTIN) 875-125 MG tablet   Oral   Take 1 tablet by mouth 2 (two) times daily. Patient not taking: Reported on 05/08/2015   20 tablet   0   . metroNIDAZOLE (FLAGYL) 500 MG tablet   Oral   Take 1 tablet (500 mg total) by mouth 3 (three) times daily. Patient not taking: Reported on 05/08/2015   42 tablet   0   . oxyCODONE-acetaminophen (ROXICET) 5-325 MG tablet   Oral   Take 1 tablet by mouth every 6 (six) hours as needed. Patient not taking: Reported on 05/08/2015   6 tablet   0   . oxyCODONE-acetaminophen (ROXICET) 5-325  MG tablet   Oral   Take 1 tablet by mouth every 6 (six) hours as needed.   7 tablet   0   . promethazine (PHENERGAN) 25 MG suppository   Rectal   Place 1 suppository (25 mg total) rectally every 6 (six) hours as needed for nausea.   12 suppository   0   . sucralfate (CARAFATE) 1 G tablet   Oral   Take 1 tablet (1 g total) by mouth 4 (four) times daily. Patient not taking: Reported on 05/08/2015   40 tablet   0     Allergies Morphine and related  History reviewed. No pertinent family history.  Social History Social History  Substance Use Topics  . Smoking status: Current Every Day Smoker -- 0.50 packs/day    Types: Cigarettes  . Smokeless tobacco: None  . Alcohol Use: No    Review of Systems Constitutional: No fever/chills Eyes: No visual changes. ENT: No sore throat. Cardiovascular: Denies chest pain. Respiratory: Denies shortness of breath. Gastrointestinal:  No constipation. Genitourinary: Negative for dysuria. Musculoskeletal: Negative for back pain. Skin: Negative for rash. Neurological: Negative for headaches, focal weakness or numbness.  10-point ROS otherwise negative.  ____________________________________________   PHYSICAL EXAM:  VITAL SIGNS: ED Triage Vitals  Enc Vitals Group     BP 08/14/15 1212 178/103 mmHg     Pulse Rate 08/14/15 1212 88  Resp 08/14/15 1212 18     Temp 08/14/15 1212 98.3 F (36.8 C)     Temp Source 08/14/15 1212 Oral     SpO2 08/14/15 1212 100 %     Weight 08/14/15 1212 170 lb (77.111 kg)     Height 08/14/15 1212 5' 3.25" (1.607 m)     Head Cir --      Peak Flow --      Pain Score 08/14/15 1220 10     Pain Loc --      Pain Edu? --      Excl. in GC? --     Constitutional: Alert and oriented. Patient rolling around on the bed. Moaning. Also has a vomitus bag with foam-like vomit with coffee-ground mixed in. Eyes: Conjunctivae are normal. PERRL. EOMI. Head: Atraumatic. Nose: No  congestion/rhinnorhea. Mouth/Throat: Mucous membranes are moist.  Oropharynx non-erythematous. Neck: No stridor.   Cardiovascular: Normal rate, regular rhythm. Grossly normal heart sounds.  Good peripheral circulation. Respiratory: Normal respiratory effort.  No retractions. Lungs CTAB. Gastrointestinal: Soft with diffuse tenderness palpation which is worse to the epigastrium. No distention.No CVA tenderness. Musculoskeletal: No lower extremity tenderness nor edema.  No joint effusions. Neurologic:  Normal speech and language. No gross focal neurologic deficits are appreciated. No gait instability. Skin:  Skin is warm, dry and intact. No rash noted. Psychiatric: Mood and affect are normal. Speech and behavior are normal.  ____________________________________________   LABS (all labs ordered are listed, but only abnormal results are displayed)  Labs Reviewed  CBC WITH DIFFERENTIAL/PLATELET - Abnormal; Notable for the following:    WBC 17.1 (*)    RDW 15.1 (*)    Neutro Abs 14.7 (*)    All other components within normal limits  COMPREHENSIVE METABOLIC PANEL - Abnormal; Notable for the following:    Potassium 3.4 (*)    Glucose, Bld 142 (*)    Total Protein 8.4 (*)    All other components within normal limits  TROPONIN I - Abnormal; Notable for the following:    Troponin I 0.05 (*)    All other components within normal limits  LIPASE, BLOOD  TYPE AND SCREEN   ____________________________________________  EKG  ED ECG REPORT I, Arelia Longest, the attending physician, personally viewed and interpreted this ECG.   Date: 08/14/2015  EKG Time: 1254  Rate: 83  Rhythm: normal sinus rhythm  Axis: Normal axis  Intervals:none  ST&T Change: No ST segment elevation or depression. No abnormal T-wave inversion.  ____________________________________________  RADIOLOGY  No active disease on the chest x-ray. ____________________________________________   PROCEDURES  Patient  with scar tissue to the bilateral upper extremities and nursing staff unable to start IV. Left sided external jugular placed. Prepped with chlorhexidine and 20-gauge inserted with good blood return. Flushes easily. Patient tolerated the procedure well. IV passed on the second attempt.  ____________________________________________   INITIAL IMPRESSION / ASSESSMENT AND PLAN / ED COURSE  Pertinent labs & imaging results that were available during my care of the patient were reviewed by me and considered in my medical decision making (see chart for details).  ----------------------------------------- 1:58 PM on 08/14/2015 -----------------------------------------  Patient has had 100 g of fentanyl this time and is still in pain. She has not vomited since the initial evaluation. However, she is still complaining of nausea. Will be redosed anti-medics. We'll give one milligram of Dilaudid. Will be admitted to the hospital. Protonix drip started. Signed out to Dr. Tildon Husky. Patient aware of plan for  [place] and willing to comply. ____________________________________________   FINAL CLINICAL IMPRESSION(S) / ED DIAGNOSES  Upper GI bleed. Nausea vomiting and diarrhea. Abdominal pain, epigastric.    Myrna Blazer, MD 08/14/15 [phone]

## 2015-08-14 NOTE — Consult Note (Signed)
Please see full GI consult by Ms Henreitta Leber.  Patient seen and examined, chart reviewed.  Patient admitted with nausea and vomiting and upper abdominal pain.  Patient reports taking zantac   One daily and multiple alkaseltzer antiacid "chews" daily.  She had been having frequent heartburn and some odynophagia. She has seen black stool on 2 occasions in the past month however has also been taking peptobismol an occasion as well.  She has a history of gastroparesis and is not currently on meds for this.  She has had multiple egd over the past several years, the last one at [facility] 02/25/12 for hematemesis with finding of gastritis.  Denies the use of asa or nsaids.  Patient is hemodynamically stable and hgb stable/normal.  Continue ppi as you are.  Will arrange EGD tomorrow pm.   I have discussed the risks benefits and complications of procedures to include not limited to bleeding, infection, perforation and the risk of sedation and the patient wishes to proceed.

## 2015-08-14 NOTE — ED Notes (Signed)
Critical lab troponin 0.05, MD made aware.

## 2015-08-15 ENCOUNTER — Encounter
Admission: EM | Disposition: A | Discharge status: Home / Self Care | Payer: Self-pay | Source: Home / Self Care | Attending: Emergency Medicine

## 2015-08-15 ENCOUNTER — Observation Stay: Payer: MEDICAID | Admitting: Anesthesiology

## 2015-08-15 ENCOUNTER — Encounter: Payer: Self-pay | Admitting: Anesthesiology

## 2015-08-15 ENCOUNTER — Observation Stay: Payer: Self-pay | Admitting: Anesthesiology

## 2015-08-15 HISTORY — PX: ESOPHAGOGASTRODUODENOSCOPY (EGD) WITH PROPOFOL: SHX5813

## 2015-08-15 LAB — TROPONIN I
TROPONIN I: 0.03 ng/mL (ref <0.031)
Troponin I: 0.04 ng/mL — ABNORMAL HIGH (ref <0.031)

## 2015-08-15 LAB — CBC
HCT: 37.5 % (ref 35.0–47.0)
HEMOGLOBIN: 12.8 g/dL (ref 12.0–16.0)
MCH: 29 pg (ref 26.0–34.0)
MCHC: 34.2 g/dL (ref 32.0–36.0)
MCV: 84.8 fL (ref 80.0–100.0)
Platelets: 287 10*3/uL (ref 150–440)
RBC: 4.42 MIL/uL (ref 3.80–5.20)
RDW: 14.5 % (ref 11.5–14.5)
WBC: 12.2 10*3/uL — AB (ref 3.6–11.0)

## 2015-08-15 LAB — GLUCOSE, CAPILLARY: GLUCOSE-CAPILLARY: 83 mg/dL (ref 65–99)

## 2015-08-15 LAB — BASIC METABOLIC PANEL
ANION GAP: 3 — AB (ref 5–15)
BUN: 17 mg/dL (ref 6–20)
CHLORIDE: 109 mmol/L (ref 101–111)
CO2: 28 mmol/L (ref 22–32)
Calcium: 8.6 mg/dL — ABNORMAL LOW (ref 8.9–10.3)
Creatinine, Ser: 0.65 mg/dL (ref 0.44–1.00)
GFR calc Af Amer: >60 mL/min (ref >60)
GFR calc non Af Amer: >60 mL/min (ref >60)
Glucose, Bld: 94 mg/dL (ref 65–99)
POTASSIUM: 3 mmol/L — AB (ref 3.5–5.1)
SODIUM: 140 mmol/L (ref 135–145)

## 2015-08-15 SURGERY — ESOPHAGOGASTRODUODENOSCOPY (EGD) WITH PROPOFOL
Anesthesia: General

## 2015-08-15 SURGERY — EGD (ESOPHAGOGASTRODUODENOSCOPY)
Anesthesia: Monitor Anesthesia Care

## 2015-08-15 MED ORDER — LIDOCAINE HCL (CARDIAC) 20 MG/ML IV SOLN
INTRAVENOUS | Status: DC | PRN
  Administered 2015-08-15: 40 mg via INTRAVENOUS

## 2015-08-15 MED ORDER — FENTANYL CITRATE (PF) 100 MCG/2ML IJ SOLN
INTRAMUSCULAR | Status: DC | PRN
  Administered 2015-08-15: 50 ug via INTRAVENOUS

## 2015-08-15 MED ORDER — MIDAZOLAM HCL 2 MG/2ML IJ SOLN
INTRAMUSCULAR | Status: DC | PRN
Start: 2015-08-15 — End: 2015-08-15
  Administered 2015-08-15: 1 mg via INTRAVENOUS

## 2015-08-15 MED ORDER — ALBUTEROL SULFATE (2.5 MG/3ML) 0.083% IN NEBU
2.5000 mg | INHALATION_SOLUTION | RESPIRATORY_TRACT | Status: DC | PRN
Start: 2015-08-15 — End: 2015-08-17
  Administered 2015-08-15: 2.5 mg via RESPIRATORY_TRACT
  Filled 2015-08-15: qty 3

## 2015-08-15 MED ORDER — ONDANSETRON HCL 4 MG/2ML IJ SOLN
INTRAMUSCULAR | Status: DC | PRN
  Administered 2015-08-15: 4 mg via INTRAVENOUS

## 2015-08-15 MED ORDER — ALBUTEROL SULFATE HFA 108 (90 BASE) MCG/ACT IN AERS: 2.0000 | INHALATION_SPRAY | RESPIRATORY_TRACT | Status: DC | PRN

## 2015-08-15 MED ORDER — PROPOFOL 500 MG/50ML IV EMUL
INTRAVENOUS | Status: DC | PRN
  Administered 2015-08-15: 150 ug/kg/min via INTRAVENOUS

## 2015-08-15 MED ORDER — HYDROMORPHONE HCL 1 MG/ML IJ SOLN
2.0000 mg | INTRAMUSCULAR | Status: DC | PRN
  Administered 2015-08-15 (×2): 2 mg via INTRAVENOUS
  Administered 2015-08-15: 1 mg via INTRAVENOUS
  Administered 2015-08-15 – 2015-08-17 (×8): 2 mg via INTRAVENOUS
  Filled 2015-08-15 (×6): qty 2
  Filled 2015-08-15: qty 1
  Filled 2015-08-15 (×4): qty 2

## 2015-08-15 MED ORDER — PROPOFOL 10 MG/ML IV BOLUS
INTRAVENOUS | Status: DC | PRN
  Administered 2015-08-15: 50 mg via INTRAVENOUS

## 2015-08-15 MED ORDER — SUCRALFATE 1 G PO TABS
1.0000 g | ORAL_TABLET | Freq: Three times a day (TID) | ORAL | Status: DC
  Administered 2015-08-15 – 2015-08-17 (×7): 1 g via ORAL
  Filled 2015-08-15 (×16): qty 1

## 2015-08-15 MED ORDER — POTASSIUM CHLORIDE 10 MEQ/100ML IV SOLN
10.0000 meq | INTRAVENOUS | Status: AC
  Administered 2015-08-15 (×4): 10 meq via INTRAVENOUS
  Filled 2015-08-15 (×4): qty 100

## 2015-08-15 NOTE — Transfer of Care (Signed)
Immediate Anesthesia Transfer of Care Note  Patient: Lynn Larson  Procedure(s) Performed: Procedure(s): ESOPHAGOGASTRODUODENOSCOPY (EGD) WITH PROPOFOL ([name])  Patient Location: PACU and Endoscopy Unit  Anesthesia Type:General  Level of Consciousness: sedated  Airway & Oxygen Therapy: Patient Spontanous Breathing and Patient connected to nasal cannula oxygen  Post-op Assessment: Report given to RN and Post -op Vital signs reviewed and stable  Post vital signs: Reviewed and stable  Last Vitals:  Filed Vitals:   08/15/15 1626 08/15/15 1627  BP: 106/75 106/75  Pulse: 77 73  Temp: 36.6 C 36.6 C  Resp: 15 17    Complications: No apparent anesthesia complications

## 2015-08-15 NOTE — Progress Notes (Signed)
Patient has no acute event overnight. PICC line was inserted by [employer]. Patient continue to receive PRN pain med for  epigastric pain, but denied nausea. VS WDL for patient and remained NSR on the monitor. H/H is stable at this time.

## 2015-08-15 NOTE — Anesthesia Postprocedure Evaluation (Signed)
Anesthesia Post Note  Patient: Lynn Larson  Procedure(s) Performed: Procedure(s) (LRB): ESOPHAGOGASTRODUODENOSCOPY (EGD) WITH PROPOFOL ([name])  Patient location during evaluation: Endoscopy Anesthesia Type: General Level of consciousness: awake and alert Pain management: pain level controlled Vital Signs Assessment: post-procedure vital signs reviewed and stable Respiratory status: spontaneous breathing, nonlabored ventilation, respiratory function stable and patient connected to nasal cannula oxygen Cardiovascular status: blood pressure returned to baseline and stable Postop Assessment: no signs of nausea or vomiting Anesthetic complications: no    Last Vitals:  Filed Vitals:   08/15/15 1711 08/15/15 1826  BP: 131/72 131/77  Pulse: 71 75  Temp: 36.6 C 36.9 C  Resp: 18 20    Last Pain:  Filed Vitals:   08/15/15 1826  PainSc: 5                  Keerthana Vanrossum S

## 2015-08-15 NOTE — H&P (Signed)
Subjective: Patient seen for nausea vomiting and abdominal pain. She continues to be nauseated. There's been some small volume emesis that has been nonbloody. In tinge to have epigastric pain.  Objective: Vital signs in last 24 hours: Temp:  [98.2 F (36.8 C)-99.1 F (37.3 C)] 99 F (37.2 C) (02/22 1500) Pulse Rate:  [58-98] 70 (02/22 1500) Resp:  [14-20] 16 (02/22 1500) BP: (123-166)/(68-96) 130/72 mmHg (02/22 1500) SpO2:  [92 %-97 %] 97 % (02/22 1500) Blood pressure 130/72, pulse 70, temperature 99 F (37.2 C), temperature source Tympanic, resp. rate 16, height 5' 3.25" (1.607 m), weight 77.111 kg (170 lb), SpO2 97 %.   Intake/Output from previous day: 02/21 0701 - 02/22 0700 In: 114.6 [I.V.:114.6] Out: 700 [Urine:700]  Intake/Output this shift: Total I/O In: 1720 [I.V.:1320; IV Piggyback:400] Out: 100 [Urine:100]   General appearance:  50 year old female uncomfortable, no acute distress. Resp:  Bilaterally clear to auscultation Cardio:  Regular rate and rhythm without rub or gallop GI:  Diffusely tender in the epigastrium. No masses or rebound. Bowel sounds are positive. Extremities:  No clubbing cyanosis or edema   Lab Results: Results for orders placed or performed during the hospital encounter of 08/14/15 (from the past 24 hour(s))  Troponin I     Status: None   Collection Time: 08/14/15  4:31 PM  Result Value Ref Range   Troponin I <0.03 <0.031 ng/mL  Hemoglobin and hematocrit, blood     Status: None   Collection Time: 08/14/15  5:42 PM  Result Value Ref Range   Hemoglobin 14.6 12.0 - 16.0 g/dL   HCT 78.2 95.6 - 21.3 %  Troponin I     Status: Abnormal   Collection Time: 08/14/15 11:17 PM  Result Value Ref Range   Troponin I 0.04 (H) <0.031 ng/mL  Hemoglobin and hematocrit, blood     Status: None   Collection Time: 08/14/15 11:17 PM  Result Value Ref Range   Hemoglobin 13.4 12.0 - 16.0 g/dL   HCT 08.6 57.8 - 46.9 %  Troponin I     Status: None   Collection  Time: 08/15/15  5:27 AM  Result Value Ref Range   Troponin I 0.03 <0.031 ng/mL  CBC     Status: Abnormal   Collection Time: 08/15/15  5:27 AM  Result Value Ref Range   WBC 12.2 (H) 3.6 - 11.0 K/uL   RBC 4.42 3.80 - 5.20 MIL/uL   Hemoglobin 12.8 12.0 - 16.0 g/dL   HCT 62.9 52.8 - 41.3 %   MCV 84.8 80.0 - 100.0 fL   MCH 29.0 26.0 - 34.0 pg   MCHC 34.2 32.0 - 36.0 g/dL   RDW 24.4 01.0 - 27.2 %   Platelets 287 150 - 440 K/uL  Basic metabolic panel     Status: Abnormal   Collection Time: 08/15/15  5:27 AM  Result Value Ref Range   Sodium 140 135 - 145 mmol/L   Potassium 3.0 (L) 3.5 - 5.1 mmol/L   Chloride 109 101 - 111 mmol/L   CO2 28 22 - 32 mmol/L   Glucose, Bld 94 65 - 99 mg/dL   BUN 17 6 - 20 mg/dL   Creatinine, Ser 5.36 0.44 - 1.00 mg/dL   Calcium 8.6 (L) 8.9 - 10.3 mg/dL   GFR calc non Af Amer >60 >60 mL/min   GFR calc Af Amer >60 >60 mL/min   Anion gap 3 (L) 5 - 15  Glucose, capillary     Status: None  Collection Time: 08/15/15 11:54 AM  Result Value Ref Range   Glucose-Capillary 83 65 - 99 mg/dL   Comment 1 Notify RN       Recent Labs  08/13/15 1408 08/14/15 1221 08/14/15 1742 08/14/15 2317 08/15/15 0527  WBC 16.4* 17.1*  --   --  12.2*  HGB 15.1 14.8 14.6 13.4 12.8  HCT 45.1 44.0 43.9 39.8 37.5  PLT 347 374  --   --  287   BMET  Recent Labs  08/13/15 1408 08/14/15 1221 08/15/15 0527  NA 141 140 140  K 3.5 3.4* 3.0*  CL 109 104 109  CO2 GLUCOSE 120* 142* 94  BUN CREATININE 0.58 0.68 0.65  CALCIUM 9.7 10.0 8.6*   LFT  Recent Labs  08/14/15 1221  PROT 8.4*  ALBUMIN 5.0  AST 23  ALT 27  ALKPHOS 87  BILITOT 1.0   PT/INR No results for input(s): LABPROT, INR in the last 72 hours. Hepatitis Panel No results for input(s): HEPBSAG, HCVAB, HEPAIGM, HEPBIGM in the last 72 hours. C-Diff No results for input(s): CDIFFTOX in the last 72 hours. No results for input(s): CDIFFPCR in the last 72 hours.   Studies/Results: Dg  Chest 1 View  08/14/2015  CLINICAL DATA:  PICC line placement EXAM: CHEST 1 VIEW COMPARISON:  08/14/2015 FINDINGS: Right PICC line tip is about 2 cm above the cavoatrial junction. No pneumothorax. Lungs clear. IMPRESSION: PICC line as described Electronically Signed   By: Esperanza Heir M.D.   On: 08/14/2015 20:54   Dg Chest 1 View  08/14/2015  CLINICAL DATA:  Abdominal pain EXAM: CHEST 1 VIEW COMPARISON:  02/15/2015 FINDINGS: Normal heart size. Lungs clear. No pneumothorax. No pleural effusion. IMPRESSION: No active disease. Electronically Signed   By: Jolaine Click M.D.   On: 08/14/2015 13:12    Scheduled Inpatient Medications:   . [MAR Hold] nicotine  14 mg Transdermal Daily  . [MAR Hold] pantoprazole (PROTONIX) IV  40 mg Intravenous Q12H  . [MAR Hold] sodium chloride flush  3 mL Intravenous Q12H    Continuous Inpatient Infusions:   . sodium chloride 125 mL/hr at 08/15/15 0528  . pantoprozole (PROTONIX) infusion 8 mg/hr (08/15/15 1027)    PRN Inpatient Medications:  [MAR Hold] acetaminophen **OR** [MAR Hold] acetaminophen, [MAR Hold] albuterol, [MAR Hold] alum & mag hydroxide-simeth, [MAR Hold] diphenhydrAMINE, [MAR Hold] hydrALAZINE, [MAR Hold]  HYDROmorphone (DILAUDID) injection, [MAR Hold] ondansetron **OR** [MAR Hold] ondansetron (ZOFRAN) IV, [MAR Hold] senna-docusate  Miscellaneous:   Assessment:  1. Nausea vomiting epigastric pain. Hematemesis.  Plan:  1. EGD today. I have discussed the risks benefits and complications of procedures to include not limited to bleeding, infection, perforation and the risk of sedation and the patient wishes to proceed. Further recommendations to follow  Christena Deem MD 08/15/2015, 4:02 PM

## 2015-08-15 NOTE — OR Nursing (Signed)
17:10 - Patient transferred to Room 149 via Hospital Bed. Report of Dorene Ar, RN

## 2015-08-15 NOTE — Anesthesia Preprocedure Evaluation (Signed)
Anesthesia Evaluation  Patient identified by MRN, date of birth, ID band Patient awake    Reviewed: Allergy & Precautions, NPO status , Patient's Chart, lab work & pertinent test results, reviewed documented beta blocker date and time   Airway Mallampati: II  TM Distance: >3 FB     Dental  (+) Chipped   Pulmonary asthma , Current Smoker,           Cardiovascular hypertension, Pt. on medications      Neuro/Psych    GI/Hepatic   Endo/Other    Renal/GU      Musculoskeletal   Abdominal   Peds  Hematology   Anesthesia Other Findings   Reproductive/Obstetrics                             Anesthesia Physical Anesthesia Plan  ASA: II  Anesthesia Plan: General   Post-op Pain Management:    Induction: Intravenous  Airway Management Planned: Nasal Cannula  Additional Equipment:   Intra-op Plan:   Post-operative Plan:   Informed Consent: I have reviewed the patients History and Physical, chart, labs and discussed the procedure including the risks, benefits and alternatives for the proposed anesthesia with the patient or authorized representative who has indicated his/her understanding and acceptance.     Plan Discussed with: CRNA  Anesthesia Plan Comments:         Anesthesia Quick Evaluation

## 2015-08-15 NOTE — Clinical Social Work Note (Signed)
Clinical Social Work Assessment  Patient Details  Name: Lynn Larson MRN: [id number] Date of Birth: 02/11/1966  Date of referral:  08/15/15               Reason for consult:  Financial Concerns                Permission sought to share information with:    Permission granted to share information::     Name::        Agency::     Relationship::     Contact Information:     Housing/Transportation Living arrangements for the past 2 months:  Single Family Home Source of Information:  Patient Patient Interpreter Needed:  None Criminal Activity/Legal Involvement Pertinent to Current Situation/Hospitalization:  No - Comment as needed Significant Relationships:  Significant Other Lives with:  Significant Other Do you feel safe going back to the place where you live?  Yes Need for family participation in patient care:  Yes (Comment)  Care giving concerns:  Patient lives in [place] with her significant other [place].    Social Worker assessment / plan:  Holiday representative (CSW) received consult for financial concerns. CSW met with patient alone at bedside. Patient was alert and oriented and laying in the bed. CSW introduced self and explained role of CSW department. Patient reported that she works at [employer] Monday through Friday and lives in [place] with her boyfriend Lynn Larson. Per patient she has no children. Per patient Lynn Larson is on disability and does not work. Patient reported that she hadhealth insurance through the affordable care act however her premiums went from $35 per month to $150 per month and she could no longer afford it. Patient reported that she goes to the [facility]. Patient reported that she has a car however it is not always reliable. CSW provided patient with [facility]. Patient is familiar with most of the financial resources in [place]. Please reconsult if future social work needs arise. CSW signing off.   Employment  status:  Lynn Larson Services information:  Self Pay (Medicaid Pending) PT Recommendations:  Not assessed at this time Information / Referral to community resources:  Other (Comment Required) [facility] )  Patient/Family's Response to care:  Patient was familiar with the resources in [place].   Patient/Family's Understanding of and Emotional Response to Diagnosis, Current Treatment, and Prognosis:  Patient was pleasant and thanked CSW for visit.   Emotional Assessment Appearance:  Appears stated age Attitude/Demeanor/Rapport:    Affect (typically observed):  Pleasant, Tearful/Crying Orientation:  Oriented to Self, Oriented to Place, Oriented to  Time, Oriented to Situation Alcohol / Substance use:  Not Applicable Psych involvement (Current and /or in the community):  No (Comment)  Discharge Needs  Concerns to be addressed:  No discharge needs identified Readmission within the last 30 days:  No Current discharge risk:  None Barriers to Discharge:  No Barriers Identified   Loralyn Freshwater, LCSW 08/15/2015, 11:08 AM

## 2015-08-15 NOTE — Op Note (Signed)
[facility] Gastroenterology Patient Name: Katelind Pytel Procedure Date: 08/15/2015 2:57 PM MRN: [id number] Account #: [id number] Date of Birth: 02-13-1966 Admit Type: Inpatient Age: 50 Room: [facility] ENDO ROOM 4 Gender: Female Note Status: Finalized Procedure:            Upper GI endoscopy Indications:          Epigastric abdominal pain, Nausea with vomiting Providers:            Christena Deem, MD Referring MD:         No Local Md, MD (Referring MD) Medicines:            Monitored Anesthesia Care Complications:        No immediate complications. Procedure:            Pre-Anesthesia Assessment:                       - ASA Grade Assessment: III - A patient with severe                        systemic disease.                       After obtaining informed consent, the endoscope was                        passed under direct vision. Throughout the procedure,                        the patient's blood pressure, pulse, and oxygen                        saturations were monitored continuously. The Endoscope                        was introduced through the mouth, and advanced to the                        pylorus. The upper GI endoscopy was performed with                        moderate difficulty due to pyloric spasm and local                        edema. The patient tolerated the procedure well. Findings:      LA Grade C (one or more mucosal breaks continuous between tops of 2 or       more mucosal folds, less than 75% circumference) esophagitis with no       bleeding was found.      The exam of the esophagus was otherwise normal.      Multiple dispersed, small non-bleeding erosions were found in the       gastric antrum and in the prepyloric region of the stomach. There were       no stigmata of recent bleeding. Biopsies were taken with a cold forceps       for histology.      Patchy moderate inflammation characterized by congestion (edema),       erosions,  erythema and linear erosions was found in the gastric antrum.      The cardia and gastric fundus were normal on retroflexion.  The exam of the stomach was otherwise normal.      The pyloric opening shoed a marked edema and the pylorus would not relax       adequately to allow passage of the scope into the duodenum. Impression:           - LA Grade C esophagitis.                       - Non-bleeding erosive gastropathy. Biopsied.                       - Erosive gastritis. Recommendation:       - Use Prilosec (omeprazole) 20 mg PO BID daily.                       - Use sucralfate tablets 1 gram PO QID for 4 weeks.                       - Do an upper GI series tomorrow. Procedure Code(s):    --- Professional ---                       [phone], 52, Esophagogastroduodenoscopy, flexible,                        transoral; with biopsy, single or multiple Diagnosis Code(s):    --- Professional ---                       K20.9, Esophagitis, unspecified                       K31.89, Other diseases of stomach and duodenum                       K29.60, Other gastritis without bleeding                       R10.13, Epigastric pain                       R11.2, Nausea with vomiting, unspecified CPT copyright 2016 American Medical Association. All rights reserved. The codes documented in this report are preliminary and upon coder review may  be revised to meet current compliance requirements. Christena Deem, MD 08/15/2015 4:29:37 PM This report has been signed electronically. Number of Addenda: 0 Note Initiated On: 08/15/2015 2:57 PM      [facility]

## 2015-08-15 NOTE — Progress Notes (Addendum)
[facility] - [place] at [facility]   PATIENT NAME: Lynn Larson    MR#:  [id number]  DATE OF BIRTH:  Mar 04, 1966  SUBJECTIVE:   Patient continues to have 10 out of 10 epigastric abdominal pain. She reports that 1 mg of Dilaudid is not keeping her 4 hours without pain. She denies hematemesis.  REVIEW OF SYSTEMS:    Review of Systems  Constitutional: Negative for fever, chills and malaise/fatigue.  HENT: Negative for sore throat.   Eyes: Negative for blurred vision.  Respiratory: Negative for cough, hemoptysis, shortness of breath and wheezing.   Cardiovascular: Negative for chest pain, palpitations and leg swelling.  Gastrointestinal: Positive for abdominal pain. Negative for nausea, vomiting, diarrhea and blood in stool.  Genitourinary: Negative for dysuria.  Musculoskeletal: Negative for back pain.  Neurological: Negative for dizziness, tremors and headaches.  Endo/Heme/Allergies: Does not bruise/bleed easily.    Tolerating Diet: Nothing by mouth      DRUG ALLERGIES:   Allergies  Allergen Reactions  . Chantix [Varenicline]     Suicidal thoughts  . Morphine And Related Other (See Comments)    Reaction:  Redness of face     VITALS:  Blood pressure 123/71, pulse 67, temperature 98.3 F (36.8 C), temperature source Oral, resp. rate 20, height 5' 3.25" (1.607 m), weight 77.111 kg (170 lb), SpO2 96 %.  PHYSICAL EXAMINATION:   Physical Exam  Constitutional: She is oriented to person, place, and time and well-developed, well-nourished, and in no distress. No distress.  HENT:  Head: Normocephalic.  Eyes: No scleral icterus.  Neck: Normal range of motion. Neck supple. No JVD present. No tracheal deviation present.  Cardiovascular: Normal rate, regular rhythm and normal heart sounds.  Exam reveals no gallop and no friction rub.   No murmur heard. Pulmonary/Chest: Effort normal and breath sounds normal. No respiratory distress. She has no  wheezes. She has no rales. She exhibits no tenderness.  Abdominal: Soft. Bowel sounds are normal. She exhibits no distension and no mass. There is no tenderness. There is no rebound and no guarding.  Musculoskeletal: Normal range of motion. She exhibits no edema.  Neurological: She is alert and oriented to person, place, and time.  Skin: Skin is warm. No rash noted. No erythema.  Psychiatric: Affect and judgment normal.      LABORATORY PANEL:   CBC  Recent Labs Lab 08/15/15 0527  WBC 12.2*  HGB 12.8  HCT 37.5  PLT 287   ------------------------------------------------------------------------------------------------------------------  Chemistries   Recent Labs Lab 08/14/15 1221 08/15/15 0527  NA 140 140  K 3.4* 3.0*  CL 104 109  CO2 23 28  GLUCOSE 142* 94  BUN 19 17  CREATININE 0.68 0.65  CALCIUM 10.0 8.6*  AST 23  --   ALT 27  --   ALKPHOS 87  --   BILITOT 1.0  --    ------------------------------------------------------------------------------------------------------------------  Cardiac Enzymes  Recent Labs Lab 08/14/15 1631 08/14/15 2317 08/15/15 0527  TROPONINI <0.03 0.04* 0.03   ------------------------------------------------------------------------------------------------------------------  RADIOLOGY:  Ct Abdomen Pelvis Wo Contrast  08/13/2015  CLINICAL DATA:  Diffuse abdominal pain starting this morning EXAM: CT ABDOMEN AND PELVIS WITHOUT CONTRAST TECHNIQUE: Multidetector CT imaging of the abdomen and pelvis was performed following the standard protocol without IV contrast. COMPARISON:  05/08/2015 FINDINGS: The lung bases are unremarkable. The study is limited without IV or oral contrast. Small hiatal hernia is noted. Sagittal images of the spine shows minimal degenerative changes lumbar spine. Unenhanced liver shows  no biliary ductal dilatation. The patient is status post cholecystectomy. Unenhanced pancreas, spleen and adrenal glands are  unremarkable. No aortic aneurysm. Mild atherosclerotic calcifications of distal abdominal aorta and common iliac arteries. Unenhanced kidneys are symmetrical in size. No nephrolithiasis. No hydronephrosis or hydroureter. Normal appendix. No pericecal inflammation. The terminal ileum is unremarkable. No thickened or distended small bowel loops. Stable diffuse mild fatty thickening of colonic wall without change from prior exam. There is no evidence of acute colitis or diverticulitis. The uterus is anteflexed.  No adnexal mass. IMPRESSION: 1. Small hiatal hernia. 2. No hydronephrosis or hydroureter.  No nephrolithiasis. 3. No small bowel obstruction. 4. Normal appendix.  No pericecal inflammation. 5. Stable diffuse mild thickening of colonic wall. No acute colitis or diverticulitis. 6. Mild atherosclerotic calcifications distal abdominal aorta and common iliac arteries. Electronically Signed   By: Liviu  Pop M.D.   On: 02/20/2017Natasha Mead  Dg Chest 1 View  08/14/2015  CLINICAL DATA:  PICC line placement EXAM: CHEST 1 VIEW COMPARISON:  08/14/2015 FINDINGS: Right PICC line tip is about 2 cm above the cavoatrial junction. No pneumothorax. Lungs clear. IMPRESSION: PICC line as described Electronically Signed   By: Esperanza Heir M.D.   On: 08/14/2015 20:54   Dg Chest 1 View  08/14/2015  CLINICAL DATA:  Abdominal pain EXAM: CHEST 1 VIEW COMPARISON:  02/15/2015 FINDINGS: Normal heart size. Lungs clear. No pneumothorax. No pleural effusion. IMPRESSION: No active disease. Electronically Signed   By: Jolaine Click M.D.   On: 08/14/2015 13:12     ASSESSMENT AND PLAN:   50 year old female with history of essential hypertension out of medications due to financial reasons he presents with midepigastric abdominal pain or hematemesis.  1. Upper GI bleed: Patient presented with hematemesis and midepigastric pain.  Hemoglobin remained stable.  In for EGD this afternoon. Appreciate GI consultation.  Continue supportive  care with pain medications and antiemetics. Transfuse if hemoglobin less than 7.   2. History of essential hypertension: Patient has not been taking medications. Continue IV hydralazine when necessary. Blood pressure acceptable 3. Tobacco dependence:. Patient counseled on admission. Continue nicotine patch.   4. Elevated troponin: This is not from ACS. Troponin is flat.   5. Hypokalemia: Potassium will be repleted and rechecked  In am   Management plans discussed with the patient and she is in agreement.  CODE STATUS: FULL  TOTAL TIME TAKING CARE OF THIS PATIENT: 30 minutes.     POSSIBLE D/C tomorrow, DEPENDING ON CLINICAL CONDITION.   Gustavus Haskin M.D on 08/15/2015 at 11:03 AM  Between 7am to 6pm - Pager - [phone] After 6pm go to [email or web address] - password [email or web address] [facility]  [place] [facility]  Office  [phone]  CC: Primary care physician; No PCP Per Patient  Note: This dictation was prepared with Dragon dictation along with smaller phrase technology. Any transcriptional errors that result from this process are unintentional.

## 2015-08-15 NOTE — Anesthesia Procedure Notes (Signed)
Date/Time: 08/15/2015 4:04 PM Performed by: Stormy Fabian Pre-anesthesia Checklist: Patient identified, Emergency Drugs available, Suction available and Patient being monitored Patient Re-evaluated:Patient Re-evaluated prior to inductionOxygen Delivery Method: Nasal cannula Intubation Type: IV induction Dental Injury: Teeth and Oropharynx as per pre-operative assessment  Comments: Nasal cannula with etCO2 monitoring

## 2015-08-16 ENCOUNTER — Encounter: Payer: Self-pay | Admitting: Gastroenterology

## 2015-08-16 ENCOUNTER — Observation Stay: Payer: MEDICAID

## 2015-08-16 LAB — BASIC METABOLIC PANEL
ANION GAP: 4 — AB (ref 5–15)
BUN: 7 mg/dL (ref 6–20)
CO2: 28 mmol/L (ref 22–32)
Calcium: 8.3 mg/dL — ABNORMAL LOW (ref 8.9–10.3)
Chloride: 110 mmol/L (ref 101–111)
Creatinine, Ser: 0.55 mg/dL (ref 0.44–1.00)
GFR calc Af Amer: >60 mL/min (ref >60)
GLUCOSE: 100 mg/dL — AB (ref 65–99)
POTASSIUM: 3.6 mmol/L (ref 3.5–5.1)
Sodium: 142 mmol/L (ref 135–145)

## 2015-08-16 LAB — CBC
HEMATOCRIT: 36.8 % (ref 35.0–47.0)
HEMOGLOBIN: 12.4 g/dL (ref 12.0–16.0)
MCH: 28.9 pg (ref 26.0–34.0)
MCHC: 33.8 g/dL (ref 32.0–36.0)
MCV: 85.6 fL (ref 80.0–100.0)
Platelets: 276 10*3/uL (ref 150–440)
RBC: 4.29 MIL/uL (ref 3.80–5.20)
RDW: 14.5 % (ref 11.5–14.5)
WBC: 12.1 10*3/uL — ABNORMAL HIGH (ref 3.6–11.0)

## 2015-08-16 MED ORDER — SUCRALFATE 1 G PO TABS: 1.0000 g | ORAL_TABLET | Freq: Three times a day (TID) | ORAL | Status: DC

## 2015-08-16 MED ORDER — SODIUM CHLORIDE 0.9 % IV SOLN
INTRAVENOUS | Status: DC
  Administered 2015-08-16: 09:00:00 via INTRAVENOUS

## 2015-08-16 MED ORDER — ONDANSETRON HCL 4 MG/2ML IJ SOLN
4.0000 mg | Freq: Once | INTRAMUSCULAR | Status: AC
  Administered 2015-08-16: 4 mg via INTRAVENOUS

## 2015-08-16 MED ORDER — PANTOPRAZOLE SODIUM 40 MG PO TBEC
40.0000 mg | DELAYED_RELEASE_TABLET | Freq: Two times a day (BID) | ORAL | Status: DC
  Administered 2015-08-16 – 2015-08-17 (×3): 40 mg via ORAL
  Filled 2015-08-16 (×4): qty 1

## 2015-08-16 NOTE — Consult Note (Signed)
Subjective: Patient seen for n/v epigastric pain and hematemesis.  Pain today 7/10.  On clear liquids.  Some emesis this am with ugis.    Objective: Vital signs in last 24 hours: Temp:  [97.8 F (36.6 C)-99 F (37.2 C)] 98.3 F (36.8 C) (02/23 0738) Pulse Rate:  [68-80] 76 (02/23 0738) Resp:  [12-20] 18 (02/23 0738) BP: (106-131)/(62-86) 128/78 mmHg (02/23 0738) SpO2:  [93 %-100 %] 96 % (02/23 0738) Blood pressure 128/78, pulse 76, temperature 98.3 F (36.8 C), temperature source Oral, resp. rate 18, height 5' 3.25" (1.607 m), weight 77.111 kg (170 lb), SpO2 96 %.   Intake/Output from previous day: 02/22 0701 - 02/23 0700 In: 4049.2 [I.V.:3649.2; IV Piggyback:400] Out: 100 [Urine:100]  Intake/Output this shift:     General appearance:  50 f anxious, no distress Resp:  bcta Cardio:  rrr GI:  Soft, tenderness across the epigastrum.  No rebound bowel sounds positive.  Extremities:     Lab Results: Results for orders placed or performed during the hospital encounter of 08/14/15 (from the past 24 hour(s))  Basic metabolic panel     Status: Abnormal   Collection Time: 08/16/15  5:23 AM  Result Value Ref Range   Sodium 142 135 - 145 mmol/L   Potassium 3.6 3.5 - 5.1 mmol/L   Chloride 110 101 - 111 mmol/L   CO2 28 22 - 32 mmol/L   Glucose, Bld 100 (H) 65 - 99 mg/dL   BUN 7 6 - 20 mg/dL   Creatinine, Ser 7.82 0.44 - 1.00 mg/dL   Calcium 8.3 (L) 8.9 - 10.3 mg/dL   GFR calc non Af Amer >60 >60 mL/min   GFR calc Af Amer >60 >60 mL/min   Anion gap 4 (L) 5 - 15  CBC     Status: Abnormal   Collection Time: 08/16/15  5:23 AM  Result Value Ref Range   WBC 12.1 (H) 3.6 - 11.0 K/uL   RBC 4.29 3.80 - 5.20 MIL/uL   Hemoglobin 12.4 12.0 - 16.0 g/dL   HCT 95.6 21.3 - 08.6 %   MCV 85.6 80.0 - 100.0 fL   MCH 28.9 26.0 - 34.0 pg   MCHC 33.8 32.0 - 36.0 g/dL   RDW 57.8 46.9 - 62.9 %   Platelets 276 150 - 440 K/uL      Recent Labs  08/14/15 1221  08/14/15 2317 08/15/15 0527  08/16/15 0523  WBC 17.1*  --   --  12.2* 12.1*  HGB 14.8  < > 13.4 12.8 12.4  HCT 44.0  < > 39.8 37.5 36.8  PLT 374  --   --  287 276  < > = values in this interval not displayed. BMET  Recent Labs  08/14/15 1221 08/15/15 0527 08/16/15 0523  NA 140 140 142  K 3.4* 3.0* 3.6  CL 104 109 110  CO2 GLUCOSE 142* 94 100*  BUN CREATININE 0.68 0.65 0.55  CALCIUM 10.0 8.6* 8.3*   LFT  Recent Labs  08/14/15 1221  PROT 8.4*  ALBUMIN 5.0  AST 23  ALT 27  ALKPHOS 87  BILITOT 1.0   PT/INR No results for input(s): LABPROT, INR in the last 72 hours. Hepatitis Panel No results for input(s): HEPBSAG, HCVAB, HEPAIGM, HEPBIGM in the last 72 hours. C-Diff No results for input(s): CDIFFTOX in the last 72 hours. No results for input(s): CDIFFPCR in the last 72 hours.   Studies/Results: Dg Chest 1  View  08/14/2015  CLINICAL DATA:  PICC line placement EXAM: CHEST 1 VIEW COMPARISON:  08/14/2015 FINDINGS: Right PICC line tip is about 2 cm above the cavoatrial junction. No pneumothorax. Lungs clear. IMPRESSION: PICC line as described Electronically Signed   By: Esperanza Heir M.D.   On: 08/14/2015 20:54   Dg Kayleen Memos  W/kub  08/16/2015  CLINICAL DATA:  Known gastric ulcers, hematemesis, diarrhea, incomplete endoscopy yesterday due to swelling. EXAM: UPPER GI SERIES WITH KUB TECHNIQUE: After obtaining a scout radiograph a routine upper GI series was performed using thin and high density barium. Effervescent crystals and a barium tablet were administered. FLUOROSCOPY TIME:  Fluoroscopy Time (in minutes and seconds): 1 minutes, 30 seconds Number of Acquired Images:  17 COMPARISON:  Noncontrast abdominal and pelvic CT scan of August 13, 2015 FINDINGS: The scout film reveals a normal bowel gas pattern. No abnormal soft tissue calcifications are observed. The bony structures are unremarkable. The patient ingested the thick and thin barium and gas forming crystals without significant  difficulty. The thoracic esophagus distended in a normal fashion. No hiatal hernia or reflux was observed. The stomach distended well. The gastric mucosal pattern appeared normal. No ulcer niche was demonstrated. Swelling of the duodenal bulb and proximal second portion of the duodenum was observed. The duodenum did partially relax and a allowed emptying of the stomach slowly into the duodenum. A small ulcer niche was demonstrated in the duodenal bulb on 1 image but could not be readily reproduced on other views. The patient voiced tenderness in the abdomen centered over the left upper quadrant rather than over the stomach or duodenum. The barium tablet passed promptly from the mouth to the stomach. IMPRESSION: 1. Normal appearance of the stomach with no significant mucosal thickening nor evidence of ulceration. 2. Probable small duodenal bulb ulcer. Edema of the duodenal folds was observed with some delay in emptying of the stomach through the somewhat narrowed proximal duodenal lumen. Electronically Signed   By: David  [place] M.D.   On: 08/16/2015 08:34    Scheduled Inpatient Medications:   . nicotine  14 mg Transdermal Daily  . pantoprazole  40 mg Oral BID AC  . sodium chloride flush  3 mL Intravenous Q12H  . sucralfate  1 g Oral TID WC & HS    Continuous Inpatient Infusions:   . sodium chloride 125 mL/hr at 08/16/15 0245  . sodium chloride 20 mL/hr at 08/16/15 0831    PRN Inpatient Medications:  acetaminophen **OR** acetaminophen, albuterol, alum & mag hydroxide-simeth, diphenhydrAMINE, hydrALAZINE, HYDROmorphone (DILAUDID) injection, ondansetron **OR** ondansetron (ZOFRAN) IV, senna-docusate  Miscellaneous:   Assessment:  1) epigastric pain nausea and hematemesis.  No further bloody emesis.  UGIS today indicated possible ulcer in the duodenum.  Patietn had marked erosive gastritis on egd yesterday but could not access the duodenum due to local edema and pyloric spasm.  This is quite  likely due to the DU.    Plan:  1) continue ppi and carafate clearas for today.  Consider full liquids for 2 more days before starting Soft/ low residue solids. Would continue that for  2-3 weeks. Will need repeat egd in about 6 weeks with fu gi o/p visit in 2-3 weeks.   Christena Deem MD 08/16/2015, 1:26 PM

## 2015-08-16 NOTE — Progress Notes (Signed)
[facility] Phys[facility] - [place] at [facility]   PATIENT NAME: Lynn Larson    MR#:  [id number]  DATE OF BIRTH:  01-13-1966  SUBJECTIVE:   Patient underwent EGD yesterday. She had some vomiting this morning. She continues to have some mild abdominal pain however improved since yesterday. REVIEW OF SYSTEMS:    Review of Systems  Constitutional: Negative for fever, chills and malaise/fatigue.  HENT: Negative for sore throat.   Eyes: Negative for blurred vision.  Respiratory: Negative for cough, hemoptysis, shortness of breath and wheezing.   Cardiovascular: Negative for chest pain, palpitations and leg swelling.  Gastrointestinal: Positive for vomiting and abdominal pain. Negative for nausea, diarrhea and blood in stool.  Genitourinary: Negative for dysuria.  Musculoskeletal: Negative for back pain.  Neurological: Negative for dizziness, tremors and headaches.  Endo/Heme/Allergies: Does not bruise/bleed easily.    Tolerating Diet: Clear liquid diet      DRUG ALLERGIES:   Allergies  Allergen Reactions  . Chantix [Varenicline]     Suicidal thoughts  . Morphine And Related Other (See Comments)    Reaction:  Redness of face     VITALS:  Blood pressure 128/78, pulse 76, temperature 98.3 F (36.8 C), temperature source Oral, resp. rate 18, height 5' 3.25" (1.607 m), weight 77.111 kg (170 lb), SpO2 96 %.  PHYSICAL EXAMINATION:   Physical Exam  Constitutional: She is oriented to person, place, and time and well-developed, well-nourished, and in no distress. No distress.  HENT:  Head: Normocephalic.  Eyes: No scleral icterus.  Neck: Normal range of motion. Neck supple. No JVD present. No tracheal deviation present.  Cardiovascular: Normal rate, regular rhythm and normal heart sounds.  Exam reveals no gallop and no friction rub.   No murmur heard. Pulmonary/Chest: Effort normal and breath sounds normal. No respiratory distress. She has no wheezes. She has no  rales. She exhibits no tenderness.  Abdominal: Soft. Bowel sounds are normal. She exhibits no distension and no mass. There is tenderness. There is no rebound and no guarding.  Musculoskeletal: Normal range of motion. She exhibits no edema.  Neurological: She is alert and oriented to person, place, and time.  Skin: Skin is warm. No rash noted. No erythema.  Psychiatric: Judgment normal.  teary      LABORATORY PANEL:   CBC  Recent Labs Lab 08/16/15 0523  WBC 12.1*  HGB 12.4  HCT 36.8  PLT 276   ------------------------------------------------------------------------------------------------------------------  Chemistries   Recent Labs Lab 08/14/15 1221  08/16/15 0523  NA 140  < > 142  K 3.4*  < > 3.6  CL 104  < > 110  CO2 23  < > 28  GLUCOSE 142*  < > 100*  BUN 19  < > 7  CREATININE 0.68  < > 0.55  CALCIUM 10.0  < > 8.3*  AST 23  --   --   ALT 27  --   --   ALKPHOS 87  --   --   BILITOT 1.0  --   --   < > = values in this interval not displayed. ------------------------------------------------------------------------------------------------------------------  Cardiac Enzymes  Recent Labs Lab 08/14/15 1631 08/14/15 2317 08/15/15 0527  TROPONINI <0.03 0.04* 0.03   ------------------------------------------------------------------------------------------------------------------  RADIOLOGY:  Dg Chest 1 View  08/14/2015  CLINICAL DATA:  PICC line placement EXAM: CHEST 1 VIEW COMPARISON:  08/14/2015 FINDINGS: Right PICC line tip is about 2 cm above the cavoatrial junction. No pneumothorax. Lungs clear. IMPRESSION: PICC line as described  Electronically Signed   By: Esperanza Heir M.D.   On: 08/14/2015 20:54   Dg Chest 1 View  08/14/2015  CLINICAL DATA:  Abdominal pain EXAM: CHEST 1 VIEW COMPARISON:  02/15/2015 FINDINGS: Normal heart size. Lungs clear. No pneumothorax. No pleural effusion. IMPRESSION: No active disease. Electronically Signed   By: Jolaine Click  M.D.   On: 08/14/2015 13:12   Dg Kayleen Memos  W/kub  08/16/2015  CLINICAL DATA:  Known gastric ulcers, hematemesis, diarrhea, incomplete endoscopy yesterday due to swelling. EXAM: UPPER GI SERIES WITH KUB TECHNIQUE: After obtaining a scout radiograph a routine upper GI series was performed using thin and high density barium. Effervescent crystals and a barium tablet were administered. FLUOROSCOPY TIME:  Fluoroscopy Time (in minutes and seconds): 1 minutes, 30 seconds Number of Acquired Images:  17 COMPARISON:  Noncontrast abdominal and pelvic CT scan of August 13, 2015 FINDINGS: The scout film reveals a normal bowel gas pattern. No abnormal soft tissue calcifications are observed. The bony structures are unremarkable. The patient ingested the thick and thin barium and gas forming crystals without significant difficulty. The thoracic esophagus distended in a normal fashion. No hiatal hernia or reflux was observed. The stomach distended well. The gastric mucosal pattern appeared normal. No ulcer niche was demonstrated. Swelling of the duodenal bulb and proximal second portion of the duodenum was observed. The duodenum did partially relax and a allowed emptying of the stomach slowly into the duodenum. A small ulcer niche was demonstrated in the duodenal bulb on 1 image but could not be readily reproduced on other views. The patient voiced tenderness in the abdomen centered over the left upper quadrant rather than over the stomach or duodenum. The barium tablet passed promptly from the mouth to the stomach. IMPRESSION: 1. Normal appearance of the stomach with no significant mucosal thickening nor evidence of ulceration. 2. Probable small duodenal bulb ulcer. Edema of the duodenal folds was observed with some delay in emptying of the stomach through the somewhat narrowed proximal duodenal lumen. Electronically Signed   By: David  [place] M.D.   On: 08/16/2015 08:34     ASSESSMENT AND PLAN:   50 year old female with  history of essential hypertension out of medications due to financial reasons he presents with midepigastric abdominal pain or hematemesis.  1. Upper GI bleed: EGD shows LA grade C esophagitis with nonbleeding erosive gastropathy and erosive gastritis. Upper GI shows duodenal ulcer, probable with edema around the duodenum. This is likely etiology of her abdominal pain.  PPI twice a day and Carafate 1 g by mouth 4 times a day for 4 weeks. Clear liquid diet and advanced tolerated.  2. History of essential hypertension: Patient has not been taking medications.  Blood pressure acceptable 3. Tobacco dependence:. Patient counseled on admission. Continue nicotine patch.   4. Elevated troponin: This is not from ACS. Troponin is flat.   5. Hypokalemia: Improved   Management plans discussed with the patient and she is in agreement.  CODE STATUS: FULL  TOTAL TIME TAKING CARE OF THIS PATIENT: 28 minutes.   D.w dr Marva Panda  POSSIBLE D/C 1-2 days DEPENDING ON CLINICAL CONDITION.   Vonna Brabson M.D on 08/16/2015 at 10:15 AM  Between 7am to 6pm - Pager - [phone] After 6pm go to [email or web address] - password [email or web address] [facility]  [place] [facility]  Office  [phone]  CC: Primary care physician; No PCP Per Patient  Note: This dictation was prepared with Dragon dictation along with smaller phrase technology. Any transcriptional errors  that result from this process are unintentional.

## 2015-08-17 LAB — CBC
HCT: 37.9 % (ref 35.0–47.0)
Hemoglobin: 13 g/dL (ref 12.0–16.0)
MCH: 29.6 pg (ref 26.0–34.0)
MCHC: 34.4 g/dL (ref 32.0–36.0)
MCV: 86 fL (ref 80.0–100.0)
Platelets: 281 10*3/uL (ref 150–440)
RBC: 4.41 MIL/uL (ref 3.80–5.20)
RDW: 14.5 % (ref 11.5–14.5)
WBC: 9.6 10*3/uL (ref 3.6–11.0)

## 2015-08-17 LAB — SURGICAL PATHOLOGY

## 2015-08-17 LAB — BASIC METABOLIC PANEL
Anion gap: 6 (ref 5–15)
BUN: 5 mg/dL — AB (ref 6–20)
CHLORIDE: 104 mmol/L (ref 101–111)
CO2: 32 mmol/L (ref 22–32)
CREATININE: 0.66 mg/dL (ref 0.44–1.00)
Calcium: 8.9 mg/dL (ref 8.9–10.3)
GFR calc non Af Amer: >60 mL/min (ref >60)
Glucose, Bld: 99 mg/dL (ref 65–99)
Potassium: 3.3 mmol/L — ABNORMAL LOW (ref 3.5–5.1)
Sodium: 142 mmol/L (ref 135–145)

## 2015-08-17 MED ORDER — PANTOPRAZOLE SODIUM 40 MG PO TBEC: 40.0000 mg | DELAYED_RELEASE_TABLET | Freq: Two times a day (BID) | ORAL | Status: DC

## 2015-08-17 MED ORDER — SUCRALFATE 1 G PO TABS: 1.0000 g | ORAL_TABLET | Freq: Three times a day (TID) | ORAL | Status: DC

## 2015-08-17 MED ORDER — NICOTINE 14 MG/24HR TD PT24: 14.0000 mg | MEDICATED_PATCH | Freq: Every day | TRANSDERMAL | Status: DC

## 2015-08-17 MED ORDER — OXYCODONE-ACETAMINOPHEN 5-325 MG PO TABS: 1.0000 | ORAL_TABLET | Freq: Four times a day (QID) | ORAL | Status: DC | PRN

## 2015-08-17 NOTE — Progress Notes (Signed)
Right PICC line d/c before d/c. Paperwork and discharge information done.

## 2015-08-17 NOTE — Care Management Note (Signed)
Case Management Note  Patient Details  Name: Srihitha Tagliaferri MRN: [id number] Date of Birth: 1965-10-29  Subjective/Objective:     Provided Ms Landstrom with a list of local clinics which serve the uninsured for medication needs and medical needs. Also provided her with an application to the [facility] across the street from [facility]. No home health services are ordered.                Action/Plan:   Expected Discharge Date:                  Expected Discharge Plan:     In-House Referral:     Discharge planning Services     Post Acute Care Choice:    Choice offered to:     DME Arranged:    DME Agency:     HH Arranged:    HH Agency:     Status of Service:     Medicare Important Message Given:    Date Medicare IM Given:    Medicare IM give by:    Date Additional Medicare IM Given:    Additional Medicare Important Message give by:     If discussed at Long Length of Stay Meetings, dates discussed:    Additional Comments:  Javanna Patin A, RN 08/17/2015, 9:39 AM

## 2015-08-17 NOTE — Progress Notes (Signed)
[facility] - [place] at [facility]        Lynn Larson was admitted to the Hospital on 08/14/2015 and Discharged  08/17/2015 and should be excused from work/school   for 10  days starting 08/14/2015 , may return to work/school without any restrictions.  Call [name] MD with questions.  [name] M.D on 08/17/2015,at 10:23 AM  [facility] - [place] at [facility]  [phone]

## 2015-08-17 NOTE — Discharge Summary (Signed)
[facility] - [place] at [facility]   PATIENT NAME: Lynn Larson    MR#:  [id number]  DATE OF BIRTH:  10/24/1965  DATE OF ADMISSION:  08/14/2015 ADMITTING PHYSICIAN: [name], MD  DATE OF DISCHARGE: *08/17/2015  PRIMARY CARE PHYSICIAN: No PCP Per Patient    ADMISSION DIAGNOSIS:  Epigastric pain [R10.13] Upper GI bleeding [K92.2] Nausea vomiting and diarrhea [R11.2, R19.7]  DISCHARGE DIAGNOSIS:  Active Problems:   Hematemesis   SECONDARY DIAGNOSIS:   Past Medical History  Diagnosis Date  . Hypertension   . Asthma     HOSPITAL COURSE:   50 year old female with history of essential hypertension out of medications due to financial reasons he presents with midepigastric abdominal pain or hematemesis.  1. Upper GI bleed: EGD shows LA grade C esophagitis with nonbleeding erosive gastropathy and erosive gastritis. Upper GI shows duodenal ulcer, probable with edema around the duodenum. This is likely etiology of her abdominal pain. PPI twice a day and Carafate 1 g by mouth 4 times a day for 4 weeks. She is tolerated her soft diet and should remain on this diet for at least 1-2 weeks. She will follow-up with GI in 4-6 weeks.  2. History of essential hypertension: Patient has not been taking medications. Blood pressure acceptable [facility].   3. Tobacco dependence:. Patient counseled on admission. She will be discharged with nicotine patch.   4. Elevated troponin: This is not from ACS. Troponin is flat.   5. Hypokalemia: Improved    DISCHARGE CONDITIONS AND DIET:   Vision stable for discharge on a soft diet  CONSULTS OBTAINED:  Treatment Team:  Christena Deem, MD  DRUG ALLERGIES:   Allergies  Allergen Reactions  . Chantix [Varenicline]     Suicidal thoughts  . Morphine And Related Other (See Comments)    Reaction:  Redness of face     DISCHARGE MEDICATIONS:   Current  Discharge Medication List    START taking these medications   Details  nicotine (NICODERM CQ - DOSED IN MG/24 HOURS) 14 mg/24hr patch Place 1 patch (14 mg total) onto the skin daily. Qty: 28 patch, Refills: 0    pantoprazole (PROTONIX) 40 MG tablet Take 1 tablet (40 mg total) by mouth 2 (two) times daily before a meal. Qty: 60 tablet, Refills: 0      CONTINUE these medications which have CHANGED   Details  oxyCODONE-acetaminophen (ROXICET) 5-325 MG tablet Take 1 tablet by mouth every 6 (six) hours as needed. Qty: 6 tablet, Refills: 0    sucralfate (CARAFATE) 1 g tablet Take 1 tablet (1 g total) by mouth 4 (four) times daily -  with meals and at bedtime. Qty: 90 tablet, Refills: 0      CONTINUE these medications which have NOT CHANGED   Details  promethazine (PHENERGAN) 25 MG suppository Place 1 suppository (25 mg total) rectally every 6 (six) hours as needed for nausea. Qty: 12 suppository, Refills: 0      STOP taking these medications     amoxicillin-clavulanate (AUGMENTIN) 875-125 MG tablet      metroNIDAZOLE (FLAGYL) 500 MG tablet               Today   CHIEF COMPLAINT:  Patient is doing well this morning. Abdominal pain is improved. She is tolerated her diet.   VITAL SIGNS:  Blood pressure 125/75, pulse 60, temperature 98.4 F (36.9 C), temperature source Oral, resp.  rate 18, height 5' 3.25" (1.607 m), weight 77.111 kg (170 lb), SpO2 96 %.   REVIEW OF SYSTEMS:  Review of Systems  Constitutional: Negative for fever, chills and malaise/fatigue.  HENT: Negative for sore throat.   Eyes: Negative for blurred vision.  Respiratory: Negative for cough, hemoptysis, shortness of breath and wheezing.   Cardiovascular: Negative for chest pain, palpitations and leg swelling.  Gastrointestinal: Positive for abdominal pain. Negative for nausea, vomiting, diarrhea and blood in stool.  Genitourinary: Negative for dysuria.  Musculoskeletal: Negative for back pain.   Neurological: Negative for dizziness, tremors and headaches.  Endo/Heme/Allergies: Does not bruise/bleed easily.     PHYSICAL EXAMINATION:  GENERAL:  50 y.o.-year-old patient lying in the bed with no acute distress.  NECK:  Supple, no jugular venous distention. No thyroid enlargement, no tenderness.  LUNGS: Normal breath sounds bilaterally, no wheezing, rales,rhonchi  No use of accessory muscles of respiration.  CARDIOVASCULAR: S1, S2 normal. No murmurs, rubs, or gallops.  ABDOMEN: Soft, mild epigastric tenderness without rebound or guarding, non-distended. Bowel sounds present. No organomegaly or mass.  EXTREMITIES: No pedal edema, cyanosis, or clubbing.  PSYCHIATRIC: The patient is alert and oriented x 3.  SKIN: No obvious rash, lesion, or ulcer.   DATA REVIEW:   CBC  Recent Labs Lab 08/17/15 0516  WBC 9.6  HGB 13.0  HCT 37.9  PLT 281    Chemistries   Recent Labs Lab 08/14/15 1221  08/17/15 0516  NA 140  < > 142  K 3.4*  < > 3.3*  CL 104  < > 104  CO2 23  < > 32  GLUCOSE 142*  < > 99  BUN 19  < > 5*  CREATININE 0.68  < > 0.66  CALCIUM 10.0  < > 8.9  AST 23  --   --   ALT 27  --   --   ALKPHOS 87  --   --   BILITOT 1.0  --   --   < > = values in this interval not displayed.  Cardiac Enzymes  Recent Labs Lab 08/14/15 1631 08/14/15 2317 08/15/15 0527  TROPONINI <0.03 0.04* 0.03    Microbiology Results  @  RADIOLOGY:  Antony Haste  W/kub  08/16/2015  CLINICAL DATA:  Known gastric ulcers, hematemesis, diarrhea, incomplete endoscopy yesterday due to swelling. EXAM: UPPER GI SERIES WITH KUB TECHNIQUE: After obtaining a scout radiograph a routine upper GI series was performed using thin and high density barium. Effervescent crystals and a barium tablet were administered. FLUOROSCOPY TIME:  Fluoroscopy Time (in minutes and seconds): 1 minutes, 30 seconds Number of Acquired Images:  17 COMPARISON:  Noncontrast abdominal and pelvic CT scan of August 13, 2015 FINDINGS: The scout film reveals a normal bowel gas pattern. No abnormal soft tissue calcifications are observed. The bony structures are unremarkable. The patient ingested the thick and thin barium and gas forming crystals without significant difficulty. The thoracic esophagus distended in a normal fashion. No hiatal hernia or reflux was observed. The stomach distended well. The gastric mucosal pattern appeared normal. No ulcer niche was demonstrated. Swelling of the duodenal bulb and proximal second portion of the duodenum was observed. The duodenum did partially relax and a allowed emptying of the stomach slowly into the duodenum. A small ulcer niche was demonstrated in the duodenal bulb on 1 image but could not be readily reproduced on other views. The patient voiced tenderness in the abdomen centered over the left upper quadrant rather  than over the stomach or duodenum. The barium tablet passed promptly from the mouth to the stomach. IMPRESSION: 1. Normal appearance of the stomach with no significant mucosal thickening nor evidence of ulceration. 2. Probable small duodenal bulb ulcer. Edema of the duodenal folds was observed with some delay in emptying of the stomach through the somewhat narrowed proximal duodenal lumen. Electronically Signed   By: David  [place] M.D.   On: 08/16/2015 08:34      Management plans discussed with the patient and she is in agreement. Stable for discharge home  Patient should follow up with GI in 4-6 weeks and opened her clinic in 1 week  CODE STATUS:     Code Status Orders        Start     Ordered   08/14/15 1734  Full code   Continuous     08/14/15 1733    Code Status History    Date Active Date Inactive Code Status Order ID Comments User Context   02/15/2015  8:31 PM 02/18/2015 10:31 PM Full Code [id number]  Katha Hamming, MD ED      TOTAL TIME TAKING CARE OF THIS PATIENT: 35 minutes.    Note: This dictation was prepared with Dragon dictation  along with smaller phrase technology. Any transcriptional errors that result from this process are unintentional.  Jakyle Petrucelli M.D on 08/17/2015 at 10:24 AM  Between 7am to 6pm - Pager - [phone] After 6pm go to [email or web address] - password [email or web address] [facility]  [facility]  Office  [phone]  CC: Primary care physician; No PCP Per Patient

## 2015-08-17 NOTE — Progress Notes (Signed)
Discharge instruction reviewed. Patient verbalized understanding. Patient discharge via wheelchair home.

## 2015-12-28 ENCOUNTER — Emergency Department
Admission: EM | Admit: 2015-12-28 | Discharge: 2015-12-28 | Disposition: A | Discharge status: Home / Self Care | Payer: Self-pay | Attending: Emergency Medicine | Admitting: Emergency Medicine

## 2015-12-28 ENCOUNTER — Emergency Department: Payer: Self-pay

## 2015-12-28 DIAGNOSIS — J45909 Unspecified asthma, uncomplicated: Secondary | ICD-10-CM | POA: Insufficient documentation

## 2015-12-28 DIAGNOSIS — G43909 Migraine, unspecified, not intractable, without status migrainosus: Secondary | ICD-10-CM | POA: Insufficient documentation

## 2015-12-28 DIAGNOSIS — R791 Abnormal coagulation profile: Secondary | ICD-10-CM | POA: Insufficient documentation

## 2015-12-28 DIAGNOSIS — I10 Essential (primary) hypertension: Secondary | ICD-10-CM | POA: Insufficient documentation

## 2015-12-28 DIAGNOSIS — R200 Anesthesia of skin: Secondary | ICD-10-CM

## 2015-12-28 DIAGNOSIS — Z79899 Other long term (current) drug therapy: Secondary | ICD-10-CM | POA: Insufficient documentation

## 2015-12-28 DIAGNOSIS — F17210 Nicotine dependence, cigarettes, uncomplicated: Secondary | ICD-10-CM | POA: Insufficient documentation

## 2015-12-28 DIAGNOSIS — R51 Headache: Secondary | ICD-10-CM

## 2015-12-28 LAB — COMPREHENSIVE METABOLIC PANEL
ALT: 17 U/L (ref 14–54)
AST: 21 U/L (ref 15–41)
Albumin: 4.6 g/dL (ref 3.5–5.0)
Alkaline Phosphatase: 90 U/L (ref 38–126)
Anion gap: 7 (ref 5–15)
BUN: 14 mg/dL (ref 6–20)
CHLORIDE: 106 mmol/L (ref 101–111)
CO2: 24 mmol/L (ref 22–32)
Calcium: 9.6 mg/dL (ref 8.9–10.3)
Creatinine, Ser: 0.67 mg/dL (ref 0.44–1.00)
Glucose, Bld: 97 mg/dL (ref 65–99)
POTASSIUM: 3.9 mmol/L (ref 3.5–5.1)
Sodium: 137 mmol/L (ref 135–145)
Total Bilirubin: 0.6 mg/dL (ref 0.3–1.2)
Total Protein: 7.7 g/dL (ref 6.5–8.1)

## 2015-12-28 LAB — CBC
HEMATOCRIT: 43.3 % (ref 35.0–47.0)
Hemoglobin: 14.9 g/dL (ref 12.0–16.0)
MCH: 29.6 pg (ref 26.0–34.0)
MCHC: 34.4 g/dL (ref 32.0–36.0)
MCV: 86 fL (ref 80.0–100.0)
Platelets: 342 10*3/uL (ref 150–440)
RBC: 5.04 MIL/uL (ref 3.80–5.20)
RDW: 14.9 % — AB (ref 11.5–14.5)
WBC: 11.5 10*3/uL — ABNORMAL HIGH (ref 3.6–11.0)

## 2015-12-28 LAB — DIFFERENTIAL
BASOS ABS: 0.1 10*3/uL (ref 0–0.1)
BASOS PCT: 1 %
EOS ABS: 0.2 10*3/uL (ref 0–0.7)
EOS PCT: 2 %
Lymphocytes Relative: 22 %
Lymphs Abs: 2.5 10*3/uL (ref 1.0–3.6)
MONO ABS: 0.9 10*3/uL (ref 0.2–0.9)
MONOS PCT: 8 %
Neutro Abs: 7.8 10*3/uL — ABNORMAL HIGH (ref 1.4–6.5)
Neutrophils Relative %: 67 %

## 2015-12-28 LAB — TROPONIN I

## 2015-12-28 LAB — PROTIME-INR
INR: 0.93
Prothrombin Time: 12.7 seconds (ref 11.4–15.0)

## 2015-12-28 LAB — GLUCOSE, CAPILLARY: GLUCOSE-CAPILLARY: 107 mg/dL — AB (ref 65–99)

## 2015-12-28 LAB — APTT: APTT: 29 s (ref 24–36)

## 2015-12-28 MED ORDER — HYDROMORPHONE HCL 1 MG/ML IJ SOLN
INTRAMUSCULAR | Status: AC
  Filled 2015-12-28: qty 1

## 2015-12-28 MED ORDER — IOPAMIDOL (ISOVUE-370) INJECTION 76%
75.0000 mL | Freq: Once | INTRAVENOUS | Status: AC | PRN
  Administered 2015-12-28: 75 mL via INTRAVENOUS

## 2015-12-28 MED ORDER — HYDROMORPHONE HCL 1 MG/ML IJ SOLN
0.5000 mg | Freq: Once | INTRAMUSCULAR | Status: AC
  Administered 2015-12-28: 0.5 mg via INTRAVENOUS
  Filled 2015-12-28: qty 1

## 2015-12-28 MED ORDER — KETOROLAC TROMETHAMINE 30 MG/ML IJ SOLN
30.0000 mg | Freq: Once | INTRAMUSCULAR | Status: AC
  Administered 2015-12-28: 30 mg via INTRAVENOUS
  Filled 2015-12-28: qty 1

## 2015-12-28 MED ORDER — DIPHENHYDRAMINE HCL 50 MG/ML IJ SOLN
50.0000 mg | Freq: Once | INTRAMUSCULAR | Status: AC
  Administered 2015-12-28: 50 mg via INTRAVENOUS
  Filled 2015-12-28: qty 1

## 2015-12-28 MED ORDER — METOCLOPRAMIDE HCL 5 MG/ML IJ SOLN
10.0000 mg | Freq: Once | INTRAMUSCULAR | Status: AC
  Administered 2015-12-28: 10 mg via INTRAVENOUS
  Filled 2015-12-28: qty 2

## 2015-12-28 MED ORDER — ASPIRIN 81 MG PO CHEW
324.0000 mg | CHEWABLE_TABLET | Freq: Once | ORAL | Status: AC
  Administered 2015-12-28: 324 mg via ORAL
  Filled 2015-12-28: qty 4

## 2015-12-28 MED ORDER — HYDROMORPHONE HCL 1 MG/ML IJ SOLN
1.0000 mg | Freq: Once | INTRAMUSCULAR | Status: AC
  Administered 2015-12-28: 1 mg via INTRAVENOUS

## 2015-12-28 MED ORDER — ONDANSETRON HCL 4 MG/2ML IJ SOLN
4.0000 mg | Freq: Once | INTRAMUSCULAR | Status: AC
  Administered 2015-12-28: 4 mg via INTRAVENOUS
  Filled 2015-12-28: qty 2

## 2015-12-28 MED ORDER — SODIUM CHLORIDE 0.9 % IV BOLUS (SEPSIS)
1000.0000 mL | Freq: Once | INTRAVENOUS | Status: AC
  Administered 2015-12-28: 1000 mL via INTRAVENOUS

## 2015-12-28 NOTE — Discharge Instructions (Signed)

## 2015-12-28 NOTE — Progress Notes (Signed)
Chaplain responded to Code Stroke as per protocol. Code released. Chaplain provided a compassionate presence and support to the patient. Chaplain Clifton JamesMelvin Tekeshia Klahr [phone].

## 2015-12-28 NOTE — ED Provider Notes (Signed)
[facility] Emergency Department Provider Note  ____________________________________________  Time seen: Approximately 12:38 PM  I have reviewed the triage vital signs and the nursing notes.   HISTORY  Chief Complaint Code Stroke; Emesis; and Headache   HPI Lynn Larson is a 50 y.o. female the history of asthma and hypertension who presents as a code stroke. Patient reports that she woke up this morning with a very severe headache at around 9:00. She reports that around 9:30 she started having numbness in the left side of her face and her left arm. She denies any prior history of shoulder. She reports her headache is generalized, currently 7 out of 10, nonradiating. She denies any prior history of stroke, or subarachnoid hemorrhage. She denies any family history of subarachnoid hemorrhage but reports that her grandfather has had a stroke in his 44s. She is a smoker. She denies any weakness, slurred speech, difficulty walking, changes in her vision. She does report that she feels very nauseated but hasn't vomited. Patient reports has a history of migraines and she was younger but hasn't had one in a while.  Past Medical History  Diagnosis Date  . Hypertension   . Asthma     Patient Active Problem List   Diagnosis Date Noted  . Hematemesis 08/14/2015  . Nausea & vomiting 02/15/2015    Past Surgical History  Procedure Laterality Date  . Cholecystectomy    . Esophagogastroduodenoscopy (egd) with propofol [name] 08/15/2015    Procedure: ESOPHAGOGASTRODUODENOSCOPY (EGD) WITH PROPOFOL;  Surgeon: Christena Deem, MD;  Location: [facility] ENDOSCOPY;  Service: Endoscopy;  Laterality: [name];    Current Outpatient Rx  Name  Route  Sig  Dispense  Refill  . acetaminophen (TYLENOL) 500 MG tablet   Oral   Take 1,000 mg by mouth every 6 (six) hours as needed for headache.         Marland Kitchen alum & mag hydroxide-simeth (MAALOX/MYLANTA) 200-200-20 MG/5ML suspension   Oral   Take  15-30 mLs by mouth 2 (two) times daily. 2 tablespoons in the morning and 1 tablespoon in the evening.         . Calcium Carbonate Antacid (ALKA-SELTZER ANTACID PO)   Oral   Take 2 tablets by mouth daily as needed (heartburn/ stomach pain due to ulcer.).         Marland Kitchen dimenhyDRINATE (DRAMAMINE) 50 MG tablet   Oral   Take 50 mg by mouth daily.         Marland Kitchen oxyCODONE-acetaminophen (ROXICET) 5-325 MG tablet   Oral   Take 1 tablet by mouth every 6 (six) hours as needed.   6 tablet   0   . pantoprazole (PROTONIX) 40 MG tablet   Oral   Take 1 tablet (40 mg total) by mouth 2 (two) times daily before a meal. Patient not taking: Reported on 12/28/2015   60 tablet   0   . sucralfate (CARAFATE) 1 g tablet   Oral   Take 1 tablet (1 g total) by mouth 4 (four) times daily -  with meals and at bedtime.   90 tablet   0     Allergies Chantix and Morphine and related  No family history on file.  Social History Social History  Substance Use Topics  . Smoking status: Current Every Day Smoker -- 0.50 packs/day    Types: Cigarettes  . Smokeless tobacco: Not on file  . Alcohol Use: No    Review of Systems Constitutional: Negative for fever. Eyes:  Negative for visual changes. ENT: Negative for sore throat. Cardiovascular: Negative for chest pain. Respiratory: Negative for shortness of breath. Gastrointestinal: Negative for abdominal pain, vomiting or diarrhea. Genitourinary: Negative for dysuria. Musculoskeletal: Negative for back pain. Skin: Negative for rash. Neurological: +Headache and numbness of the left face and left arm  ____________________________________________   PHYSICAL EXAM:  VITAL SIGNS: ED Triage Vitals  Enc Vitals Group     BP 12/28/15 1228 143/91 mmHg     Pulse Rate 12/28/15 1228 86     Resp 12/28/15 1228 18     Temp 12/28/15 1228 97.9 F (36.6 C)     Temp Source 12/28/15 1228 Oral     SpO2 12/28/15 1228 96 %     Weight 12/28/15 1228 169 lb (76.658 kg)      Height 12/28/15 1228 5\' 3"  (1.6 m)     Head Cir --      Peak Flow --      Pain Score 12/28/15 1229 8     Pain Loc --      Pain Edu? --      Excl. in GC? --     Constitutional: Alert and oriented. Well appearing and in no apparent distress. HEENT:      Head: Normocephalic and atraumatic.         Eyes: Conjunctivae are normal. Sclera is non-icteric. EOMI. PERRL      Mouth/Throat: Mucous membranes are moist.       Neck: Supple with no signs of meningismus. Cardiovascular: Regular rate and rhythm. No murmurs, gallops, or rubs. 2+ symmetrical distal pulses are present in all extremities. No JVD. Respiratory: Normal respiratory effort. Lungs are clear to auscultation bilaterally. No wheezes, crackles, or rhonchi.  Gastrointestinal: Soft, non tender, and non distended with positive bowel sounds. No rebound or guarding. Musculoskeletal: Nontender with normal range of motion in all extremities. No edema, cyanosis, or erythema of extremities. Neurologic: A & O x3, PERRL, no nystagmus, CN II-XII intact, motor testing reveals good tone and bulk throughout. There is no evidence of pronator drift or dysmetria. Muscle strength is 5/5 throughout. Deep tendon reflexes are 2+ throughout with downgoing toes. Mild decreased sensation to the left face and left forearm. Gait is normal. NIHSS 1 Skin: Skin is warm, dry and intact. No rash noted. Psychiatric: Mood and affect are normal. Speech and behavior are normal.  ____________________________________________   LABS (all labs ordered are listed, but only abnormal results are displayed)  Labs Reviewed  CBC - Abnormal; Notable for the following:    WBC 11.5 (*)    RDW 14.9 (*)    All other components within normal limits  DIFFERENTIAL - Abnormal; Notable for the following:    Neutro Abs 7.8 (*)    All other components within normal limits  GLUCOSE, CAPILLARY - Abnormal; Notable for the following:    Glucose-Capillary 107 (*)    All other components  within normal limits  PROTIME-INR  APTT  COMPREHENSIVE METABOLIC PANEL  TROPONIN I  CBG MONITORING, ED   ____________________________________________  EKG  EKG evaluated by attending, Dr. Nita Sicklearolina Mekiah [place]  Normal sinus rhythm, rate of 75, normal intervals, normal axis, no ST elevations or depressions.  ____________________________________________  RADIOLOGY  CT head: negative CTA head and neck: negative ____________________________________________   PROCEDURES  Procedure(s) performed: None Critical Care performed: yes  CRITICAL CARE Performed by: Nita Sicklearolina Davona [place]  ?  Total critical care time: 40 min  Critical care time was exclusive of separately billable procedures and  treating other patients.  Critical care was necessary to treat or prevent imminent or life-threatening deterioration.  Critical care was time spent personally by me on the following activities: development of treatment plan with patient and/or surrogate as well as nursing, discussions with consultants, evaluation of patient's response to treatment, examination of patient, obtaining history from patient or surrogate, ordering and performing treatments and interventions, ordering and review of laboratory studies, ordering and review of radiographic studies, pulse oximetry and re-evaluation of patient's condition.  ____________________________________________   INITIAL IMPRESSION / ASSESSMENT AND PLAN / ED COURSE  50 y.o. female the history of asthma and hypertension who presents as a code stroke. Patient woke up at 9 AM with severe headache and at 9:30 AM started having numbness in the left side of her face and arm. Patient was made a code stroke. NIH stroke scale on arrival 1. CT scan negative. Patient given aspirin and Zofran for nausea. Awaiting neurology evaluation.   1:10 PM Patient evaluated by neurology who recommended CTA of the neck. Patient not candidate for TPA based on mild deficits and  recent GIB. We'll treat her headache with IV Dilaudid and antiemetics.  3:49 PM CTA negative. Will treat as a complex migraine. Patient received two rounds of dilaudid and reglan. I will give IV benadryl, IVF, and IV toradol.  5:39 PM HA has resolved. Patient remains neuro intact with no neuro deficits. Will dc home on supportive care. [facility]   Pertinent labs & imaging results that were available during my care of the patient were reviewed by me and considered in my medical decision making (see chart for details).    ____________________________________________   FINAL CLINICAL IMPRESSION(S) / ED DIAGNOSES  Final diagnoses:  Migraine without status migrainosus, not intractable, unspecified migraine type      NEW MEDICATIONS STARTED DURING THIS VISIT:  New Prescriptions   No medications on file     Note:  This document was prepared using Dragon voice recognition software and may include unintentional dictation errors.    Nita Sicklearolina Janiel Derhammer, MD 12/28/15 [phone]

## 2015-12-28 NOTE — Consult Note (Signed)
Referring Physician: Don PerkingVeronese    Chief Complaint: Left hand numbness, headache  HPI: Lynn Larson is an 50 y.o. female who reports waking up this morning with a headache that increased in severity.  By 0930 headache was a 10/10 and patient noted forehead tingling (left greater than right) and left hand tingling. With no improvement in pain patient presented for evaluation.   Reports that headache is throbbing and associated with seeing black spots, nausea and vomiting.   Initial NIHSS of 1.    Date last known well: 12/28/2015 Time last known well: Time: 09:30 tPA Given: No: Minimal disability, recent GIB  Past Medical History  Diagnosis Date  . Hypertension   . Asthma     Past Surgical History  Procedure Laterality Date  . Cholecystectomy    . Esophagogastroduodenoscopy (egd) with propofol [name] 08/15/2015    Procedure: ESOPHAGOGASTRODUODENOSCOPY (EGD) WITH PROPOFOL;  Surgeon: Christena DeemMartin U Skulskie, MD;  Location: [facility] ENDOSCOPY;  Service: Endoscopy;  Laterality: [name];    Family history: Unable to provide due to pain  Social History:  reports that she has been smoking Cigarettes.  She has been smoking about 0.50 packs per day. She does not have any smokeless tobacco history on file. She reports that she does not drink alcohol. Her drug history is not on file.  Allergies:  Allergies  Allergen Reactions  . Chantix [Varenicline]     Suicidal thoughts  . Morphine And Related Other (See Comments)    Reaction:  Redness of face     Medications: I have reviewed the patient's current medications. Prior to Admission:  Prior to Admission medications   Medication Sig Start Date End Date Taking? Authorizing Provider  nicotine (NICODERM CQ - DOSED IN MG/24 HOURS) 14 mg/24hr patch Place 1 patch (14 mg total) onto the skin daily. 08/17/15   Adrian SaranSital Mody, MD  oxyCODONE-acetaminophen (ROXICET) 5-325 MG tablet Take 1 tablet by mouth every 6 (six) hours as needed. 08/17/15   Adrian SaranSital Mody, MD  pantoprazole  (PROTONIX) 40 MG tablet Take 1 tablet (40 mg total) by mouth 2 (two) times daily before a meal. 08/17/15   Adrian SaranSital Mody, MD  promethazine (PHENERGAN) 25 MG suppository Place 1 suppository (25 mg total) rectally every 6 (six) hours as needed for nausea. Patient not taking: Reported on 08/14/2015 05/08/15 05/07/16  Rebecka ApleyAllison P Webster, MD  sucralfate (CARAFATE) 1 g tablet Take 1 tablet (1 g total) by mouth 4 (four) times daily -  with meals and at bedtime. 08/17/15   Adrian SaranSital Mody, MD    ROS: History obtained from the patient  General ROS: negative for - chills, fatigue, fever, night sweats, weight gain or weight loss Psychological ROS: negative for - behavioral disorder, hallucinations, memory difficulties, mood swings or suicidal ideation Ophthalmic ROS: as noted in HPI ENT ROS: negative for - epistaxis, nasal discharge, oral lesions, sore throat, tinnitus or vertigo Allergy and Immunology ROS: negative for - hives or itchy/watery eyes Hematological and Lymphatic ROS: negative for - bleeding problems, bruising or swollen lymph nodes Endocrine ROS: negative for - galactorrhea, hair pattern changes, polydipsia/polyuria or temperature intolerance Respiratory ROS: negative for - cough, hemoptysis, shortness of breath or wheezing Cardiovascular ROS: negative for - chest pain, dyspnea on exertion, edema or irregular heartbeat Gastrointestinal ROS: as noted in HPI Genito-Urinary ROS: negative for - dysuria, hematuria, incontinence or urinary frequency/urgency Musculoskeletal ROS: negative for - joint swelling or muscular weakness Neurological ROS: as noted in HPI Dermatological ROS: negative for rash and skin lesion changes  Physical Examination: Blood pressure 143/91, pulse 86, temperature 97.9 F (36.6 C), temperature source Oral, resp. rate 18, height 5\' 3"  (1.6 m), weight 76.658 kg (169 lb), SpO2 96 %.  HEENT-  Normocephalic, no lesions, without obvious abnormality.  Normal external eye and  conjunctiva.  Normal TM's bilaterally.  Normal auditory canals and external ears. Normal external nose, mucus membranes and septum.  Normal pharynx. Cardiovascular- S1, S2 normal, pulses palpable throughout   Lungs- chest clear, no wheezing, rales, normal symmetric air entry Abdomen- soft, non-tender; bowel sounds normal; no masses,  no organomegaly Extremities- no edema Lymph-no adenopathy palpable Musculoskeletal-no joint tenderness, deformity or swelling Skin-warm and dry, no hyperpigmentation, vitiligo, or suspicious lesions  Neurological Examination Mental Status: Alert, oriented, thought content appropriate, tearful.  Speech fluent without evidence of aphasia.  Able to follow 3 step commands without difficulty. Cranial Nerves: II: Discs flat bilaterally; Visual fields grossly normal, pupils equal, round, reactive to light and accommodation III,IV, VI: ptosis not present, extra-ocular motions intact bilaterally V,VII: smile symmetric, facial light touch sensation normal bilaterally VIII: hearing normal bilaterally IX,X: gag reflex present XI: bilateral shoulder shrug XII: midline tongue extension Motor: Right : Upper extremity   5/5    Left:     Upper extremity   5/5  Lower extremity   5/5     Lower extremity   5/5 Tone and bulk:normal tone throughout; no atrophy noted Sensory: Pinprick and light touch decreased in the left hand and on the forehead Deep Tendon Reflexes: 2+ and symmetric throughout Plantars: Right: downgoing   Left: downgoing Cerebellar: Normal finger-to-nose and normal heel-to-shin testing bilaterally Gait: not tested due to severe pain    Laboratory Studies:  Basic Metabolic Panel: No results for input(s): NA, K, CL, CO2, GLUCOSE, BUN, CREATININE, CALCIUM, MG, PHOS in the last 168 hours.  Liver Function Tests: No results for input(s): AST, ALT, ALKPHOS, BILITOT, PROT, ALBUMIN in the last 168 hours. No results for input(s): LIPASE, AMYLASE in the last 168  hours. No results for input(s): AMMONIA in the last 168 hours.  CBC: No results for input(s): WBC, NEUTROABS, HGB, HCT, MCV, PLT in the last 168 hours.  Cardiac Enzymes: No results for input(s): CKTOTAL, CKMB, CKMBINDEX, TROPONINI in the last 168 hours.  BNP: Invalid input(s): POCBNP  CBG: No results for input(s): GLUCAP in the last 168 hours.  Microbiology: Results for orders placed or performed during the hospital encounter of 02/15/15  C difficile quick scan w PCR reflex     Status: Abnormal   Collection Time: 02/16/15  8:14 PM  Result Value Ref Range Status   C Diff antigen POSITIVE (A) NEGATIVE Final   C Diff toxin NEGATIVE NEGATIVE Final   C Diff interpretation   Final    Negative for toxigenic C. difficile. Toxin gene and active toxin production not detected. May be a nontoxigenic strain of C. difficile bacteria present, lacking the ability to produce toxin.  Giardia, EIA; Ova/Parasite     Status: None   Collection Time: 02/16/15  8:14 PM  Result Value Ref Range Status   Ova + Parasite Exam Final report  Final    Comment: (NOTE) These results were obtained using wet preparation(s) and trichrome stained smear. This test does not include testing for Cryptosporidium parvum, Cyclospora, or Microsporidia.    Giardia Ag, Stl Negative Negative Final    Comment: (NOTE) Performed At: [facility] [place] [id number] [place], [place] [id number] Revonda Standardiley Celeste R MD WN:[id number] Performed At: [facility]  [place] [address] [place], [place] [id number] Mila Homer MD WG:[id number]   Clostridium Difficile by PCR     Status: None   Collection Time: 02/16/15  8:14 PM  Result Value Ref Range Status   Toxigenic C Difficile by pcr NEGATIVE NEGATIVE Final  Stool culture     Status: None   Collection Time: 02/17/15 12:04 PM  Result Value Ref Range Status   Specimen Description STOOL  Final   Special Requests Normal  Final   Culture   Final    NO CAMPYLOBACTER  DETECTED NO SALMONELLA OR SHIGELLA ISOLATED No Pathogenic E. coli detected    Report Status 02/19/2015 FINAL  Final    Coagulation Studies: No results for input(s): LABPROT, INR in the last 72 hours.  Urinalysis: No results for input(s): COLORURINE, LABSPEC, PHURINE, GLUCOSEU, HGBUR, BILIRUBINUR, KETONESUR, PROTEINUR, UROBILINOGEN, NITRITE, LEUKOCYTESUR in the last 168 hours.  Invalid input(s): APPERANCEUR  Lipid Panel: No results found for: CHOL, TRIG, HDL, CHOLHDL, VLDL, LDLCALC  HgbA1C:  Lab Results  Component Value Date   HGBA1C 5.6 02/25/2012    Urine Drug Screen:      Component Value Date/Time   LABOPIA POSITIVE* 02/16/2015 1733   COCAINSCRNUR NONE DETECTED 02/16/2015 1733   LABBENZ NONE DETECTED 02/16/2015 1733   AMPHETMU NONE DETECTED 02/16/2015 1733   THCU POSITIVE* 02/16/2015 1733   LABBARB NONE DETECTED 02/16/2015 1733    Alcohol Level: No results for input(s): ETH in the last 168 hours.   Imaging: Ct Head Wo Contrast  12/28/2015  CLINICAL DATA:  Code stroke, LEFT-sided numbness, history hypertension, smoking, asthma EXAM: CT HEAD WITHOUT CONTRAST TECHNIQUE: Contiguous axial images were obtained from the base of the skull through the vertex without intravenous contrast. Sagittal and coronal MPR images reconstructed from axial data set. COMPARISON:  None FINDINGS: Streak artifacts at brainstem/skullbase. Normal ventricular morphology. No midline shift or mass effect. Normal appearance of brain parenchyma. No intracranial hemorrhage, mass lesion or evidence acute infarction. No extra-axial fluid collections. Nasal septal deviation to the RIGHT. Paranasal sinuses and mastoid air cells clear. No acute osseous findings. IMPRESSION: No acute intracranial abnormalities. Findings called to Dr.  Don Perking On 12/28/2015 at 1248 hours. Electronically Signed   By: Ulyses Southward M.D.   On: 12/28/2015 12:49    Assessment: 50 y.o. female presenting with headache and left arm and face  tingling.  Patient reports that this is the worst pain she has ever had.  Head CT personally reviewed and shows no acute changes.  Due to focal neurological symptoms and nausea/vomiting would rule out a posterior circulation thrombosis.  Symptoms may otherwise be related to headache.  Patient does have a history of headaches in the past.    Stroke Risk Factors - hypertension and smoking  Plan: 1. CTA of the head and neck.  If no posterior circulation thrombosis noted would not initiate stroke work up at this time. 2.  Analgesia for pain 3.  If after relief of headache, focal symptoms remain would consider MRI of the brain without contrast and stroke work at that time.  4.  Continue neuro checks and tele monitoring  Case discussed with Dr. Cheri Rous, MD Neurology [phone] 12/28/2015, 1:05 PM

## 2015-12-28 NOTE — ED Notes (Signed)
Says headache since early this am.  Went to work and at about 930 started having tingling in left arm and left face which is still there.

## 2016-01-16 ENCOUNTER — Inpatient Hospital Stay
Admission: EM | Admit: 2016-01-16 | Discharge: 2016-01-20 | DRG: 392 | Disposition: A | Discharge status: Home / Self Care | Payer: Self-pay | Attending: Internal Medicine | Admitting: Internal Medicine

## 2016-01-16 ENCOUNTER — Emergency Department: Payer: Self-pay

## 2016-01-16 DIAGNOSIS — Z9049 Acquired absence of other specified parts of digestive tract: Secondary | ICD-10-CM

## 2016-01-16 DIAGNOSIS — J45909 Unspecified asthma, uncomplicated: Secondary | ICD-10-CM | POA: Diagnosis present

## 2016-01-16 DIAGNOSIS — Z9114 Patient's other noncompliance with medication regimen: Secondary | ICD-10-CM

## 2016-01-16 DIAGNOSIS — E876 Hypokalemia: Secondary | ICD-10-CM | POA: Diagnosis present

## 2016-01-16 DIAGNOSIS — F419 Anxiety disorder, unspecified: Secondary | ICD-10-CM | POA: Diagnosis present

## 2016-01-16 DIAGNOSIS — K210 Gastro-esophageal reflux disease with esophagitis: Secondary | ICD-10-CM | POA: Diagnosis present

## 2016-01-16 DIAGNOSIS — K529 Noninfective gastroenteritis and colitis, unspecified: Principal | ICD-10-CM | POA: Diagnosis present

## 2016-01-16 DIAGNOSIS — I10 Essential (primary) hypertension: Secondary | ICD-10-CM | POA: Diagnosis present

## 2016-01-16 DIAGNOSIS — F329 Major depressive disorder, single episode, unspecified: Secondary | ICD-10-CM | POA: Diagnosis present

## 2016-01-16 DIAGNOSIS — Z888 Allergy status to other drugs, medicaments and biological substances status: Secondary | ICD-10-CM

## 2016-01-16 DIAGNOSIS — Z8711 Personal history of peptic ulcer disease: Secondary | ICD-10-CM

## 2016-01-16 DIAGNOSIS — R1084 Generalized abdominal pain: Secondary | ICD-10-CM

## 2016-01-16 DIAGNOSIS — K259 Gastric ulcer, unspecified as acute or chronic, without hemorrhage or perforation: Secondary | ICD-10-CM | POA: Diagnosis present

## 2016-01-16 DIAGNOSIS — Z79899 Other long term (current) drug therapy: Secondary | ICD-10-CM

## 2016-01-16 DIAGNOSIS — Z885 Allergy status to narcotic agent status: Secondary | ICD-10-CM

## 2016-01-16 DIAGNOSIS — F17210 Nicotine dependence, cigarettes, uncomplicated: Secondary | ICD-10-CM | POA: Diagnosis present

## 2016-01-16 DIAGNOSIS — Z803 Family history of malignant neoplasm of breast: Secondary | ICD-10-CM

## 2016-01-16 DIAGNOSIS — Z8041 Family history of malignant neoplasm of ovary: Secondary | ICD-10-CM

## 2016-01-16 DIAGNOSIS — R1013 Epigastric pain: Secondary | ICD-10-CM

## 2016-01-16 LAB — COMPREHENSIVE METABOLIC PANEL
ALT: 19 U/L (ref 14–54)
AST: 19 U/L (ref 15–41)
Albumin: 4.7 g/dL (ref 3.5–5.0)
Alkaline Phosphatase: 95 U/L (ref 38–126)
Anion gap: 11 (ref 5–15)
BUN: 10 mg/dL (ref 6–20)
CHLORIDE: 106 mmol/L (ref 101–111)
CO2: 26 mmol/L (ref 22–32)
CREATININE: 0.6 mg/dL (ref 0.44–1.00)
Calcium: 9.7 mg/dL (ref 8.9–10.3)
GFR calc non Af Amer: >60 mL/min (ref >60)
Glucose, Bld: 132 mg/dL — ABNORMAL HIGH (ref 65–99)
POTASSIUM: 3.3 mmol/L — AB (ref 3.5–5.1)
SODIUM: 143 mmol/L (ref 135–145)
Total Bilirubin: 0.7 mg/dL (ref 0.3–1.2)
Total Protein: 8 g/dL (ref 6.5–8.1)

## 2016-01-16 LAB — URINALYSIS COMPLETE WITH MICROSCOPIC (ARMC ONLY)
BILIRUBIN URINE: NEGATIVE
GLUCOSE, UA: NEGATIVE mg/dL
Leukocytes, UA: NEGATIVE
NITRITE: NEGATIVE
PH: 7 (ref 5.0–8.0)
Protein, ur: 30 mg/dL — AB
Specific Gravity, Urine: 1.019 (ref 1.005–1.030)

## 2016-01-16 LAB — CBC
HCT: 44 % (ref 35.0–47.0)
Hemoglobin: 15 g/dL (ref 12.0–16.0)
MCH: 29.3 pg (ref 26.0–34.0)
MCHC: 34.1 g/dL (ref 32.0–36.0)
MCV: 85.7 fL (ref 80.0–100.0)
PLATELETS: 397 10*3/uL (ref 150–440)
RBC: 5.14 MIL/uL (ref 3.80–5.20)
RDW: 14.9 % — ABNORMAL HIGH (ref 11.5–14.5)
WBC: 19.2 10*3/uL — AB (ref 3.6–11.0)

## 2016-01-16 LAB — LIPASE, BLOOD: Lipase: 20 U/L (ref 11–51)

## 2016-01-16 MED ORDER — IOPAMIDOL (ISOVUE-300) INJECTION 61%
100.0000 mL | Freq: Once | INTRAVENOUS | Status: AC | PRN
  Administered 2016-01-16: 100 mL via INTRAVENOUS

## 2016-01-16 MED ORDER — METRONIDAZOLE IN NACL 5-0.79 MG/ML-% IV SOLN
500.0000 mg | Freq: Once | INTRAVENOUS | Status: AC
  Administered 2016-01-16: 500 mg via INTRAVENOUS
  Filled 2016-01-16: qty 100

## 2016-01-16 MED ORDER — HYDROMORPHONE HCL 1 MG/ML IJ SOLN
1.0000 mg | Freq: Once | INTRAMUSCULAR | Status: AC
  Administered 2016-01-16: 1 mg via INTRAVENOUS
  Filled 2016-01-16: qty 1

## 2016-01-16 MED ORDER — ACETAMINOPHEN 325 MG PO TABS: 650.0000 mg | ORAL_TABLET | Freq: Four times a day (QID) | ORAL | Status: DC | PRN

## 2016-01-16 MED ORDER — METOCLOPRAMIDE HCL 5 MG/ML IJ SOLN
INTRAMUSCULAR | Status: AC
  Administered 2016-01-16: 10 mg via INTRAVENOUS
  Filled 2016-01-16: qty 2

## 2016-01-16 MED ORDER — ONDANSETRON HCL 4 MG/2ML IJ SOLN
INTRAMUSCULAR | Status: AC
  Administered 2016-01-16: 4 mg via INTRAVENOUS
  Filled 2016-01-16: qty 2

## 2016-01-16 MED ORDER — ONDANSETRON HCL 4 MG PO TABS: 4.0000 mg | ORAL_TABLET | Freq: Four times a day (QID) | ORAL | Status: DC | PRN

## 2016-01-16 MED ORDER — MORPHINE SULFATE (PF) 4 MG/ML IV SOLN
INTRAVENOUS | Status: AC
  Administered 2016-01-16: 4 mg via INTRAVENOUS
  Filled 2016-01-16: qty 1

## 2016-01-16 MED ORDER — ACETAMINOPHEN 650 MG RE SUPP: 650.0000 mg | Freq: Four times a day (QID) | RECTAL | Status: DC | PRN

## 2016-01-16 MED ORDER — CIPROFLOXACIN IN D5W 400 MG/200ML IV SOLN
400.0000 mg | Freq: Once | INTRAVENOUS | Status: AC
  Administered 2016-01-16: 400 mg via INTRAVENOUS
  Filled 2016-01-16: qty 200

## 2016-01-16 MED ORDER — ONDANSETRON HCL 4 MG/2ML IJ SOLN
4.0000 mg | Freq: Once | INTRAMUSCULAR | Status: AC
  Administered 2016-01-16: 4 mg via INTRAVENOUS

## 2016-01-16 MED ORDER — HYDROMORPHONE HCL 1 MG/ML IJ SOLN
1.0000 mg | INTRAMUSCULAR | Status: DC | PRN
  Administered 2016-01-16 – 2016-01-17 (×6): 1 mg via INTRAVENOUS
  Filled 2016-01-16 (×6): qty 1

## 2016-01-16 MED ORDER — METOCLOPRAMIDE HCL 5 MG/ML IJ SOLN
10.0000 mg | Freq: Once | INTRAMUSCULAR | Status: AC
  Administered 2016-01-16: 10 mg via INTRAVENOUS

## 2016-01-16 MED ORDER — DIATRIZOATE MEGLUMINE & SODIUM 66-10 % PO SOLN
15.0000 mL | Freq: Once | ORAL | Status: AC
  Administered 2016-01-16: 15 mL via ORAL

## 2016-01-16 MED ORDER — ONDANSETRON HCL 4 MG/2ML IJ SOLN
4.0000 mg | Freq: Four times a day (QID) | INTRAMUSCULAR | Status: DC | PRN
  Administered 2016-01-16 – 2016-01-18 (×6): 4 mg via INTRAVENOUS
  Filled 2016-01-16 (×6): qty 2

## 2016-01-16 MED ORDER — MORPHINE SULFATE (PF) 4 MG/ML IV SOLN
4.0000 mg | Freq: Once | INTRAVENOUS | Status: AC
  Administered 2016-01-16: 4 mg via INTRAVENOUS

## 2016-01-16 MED ORDER — ENOXAPARIN SODIUM 40 MG/0.4ML ~~LOC~~ SOLN
40.0000 mg | SUBCUTANEOUS | Status: DC
  Administered 2016-01-16 – 2016-01-19 (×4): 40 mg via SUBCUTANEOUS
  Filled 2016-01-16 (×4): qty 0.4

## 2016-01-16 MED ORDER — POTASSIUM CHLORIDE IN NACL 20-0.9 MEQ/L-% IV SOLN
INTRAVENOUS | Status: DC
  Administered 2016-01-16: 23:00:00 via INTRAVENOUS
  Filled 2016-01-16 (×3): qty 1000

## 2016-01-16 MED ORDER — METRONIDAZOLE IN NACL 5-0.79 MG/ML-% IV SOLN
500.0000 mg | Freq: Three times a day (TID) | INTRAVENOUS | Status: DC
  Administered 2016-01-17 – 2016-01-20 (×10): 500 mg via INTRAVENOUS
  Filled 2016-01-16 (×12): qty 100

## 2016-01-16 MED ORDER — CIPROFLOXACIN IN D5W 400 MG/200ML IV SOLN
400.0000 mg | Freq: Two times a day (BID) | INTRAVENOUS | Status: DC
  Administered 2016-01-17 – 2016-01-19 (×6): 400 mg via INTRAVENOUS
  Filled 2016-01-16 (×8): qty 200

## 2016-01-16 NOTE — ED Provider Notes (Signed)
[facility] Emergency Department Provider Note  Time seen: 4:54 PM  I have reviewed the triage vital signs and the nursing notes.   HISTORY  Chief Complaint Abdominal Pain    HPI Lynn Larson is a 50 y.o. female with a past medical history of asthma, hypertension, who presents the emergency department with abdominal pain, nausea and vomiting. According to the patient she awoke this morning with severe diffuse abdominal pain, nausea and vomiting. Patient states this is somewhat common for her and happens approximately once every 1-2 months where she will have diffuse abdominal pain and persistent nausea and vomiting. She states when she starts vomiting she cannot stop. Patient denies any fever, diarrhea, chest pain or trouble breathing. Describes abdominal pain as 10/10 severe and diffuse in all quadrants.  Past Medical History:  Diagnosis Date  . Asthma   . Hypertension     Patient Active Problem List   Diagnosis Date Noted  . Hematemesis 08/14/2015  . Nausea & vomiting 02/15/2015    Past Surgical History:  Procedure Laterality Date  . CHOLECYSTECTOMY    . ESOPHAGOGASTRODUODENOSCOPY (EGD) WITH PROPOFOL [name] 08/15/2015   Procedure: ESOPHAGOGASTRODUODENOSCOPY (EGD) WITH PROPOFOL;  Surgeon: Christena Deem, MD;  Location: [facility] ENDOSCOPY;  Service: Endoscopy;  Laterality: [name];    Prior to Admission medications   Medication Sig Start Date End Date Taking? Authorizing Provider  acetaminophen (TYLENOL) 500 MG tablet Take 1,000 mg by mouth every 6 (six) hours as needed for headache.    Historical Provider, MD  alum & mag hydroxide-simeth (MAALOX/MYLANTA) 200-200-20 MG/5ML suspension Take 15-30 mLs by mouth 2 (two) times daily. 2 tablespoons in the morning and 1 tablespoon in the evening.    Historical Provider, MD  Calcium Carbonate Antacid (ALKA-SELTZER ANTACID PO) Take 2 tablets by mouth daily as needed (heartburn/ stomach pain due to ulcer.).    Historical  Provider, MD  dimenhyDRINATE (DRAMAMINE) 50 MG tablet Take 50 mg by mouth daily.    Historical Provider, MD  oxyCODONE-acetaminophen (ROXICET) 5-325 MG tablet Take 1 tablet by mouth every 6 (six) hours as needed. 08/17/15   [name], MD  pantoprazole (PROTONIX) 40 MG tablet Take 1 tablet (40 mg total) by mouth 2 (two) times daily before a meal. Patient not taking: Reported on 12/28/2015 08/17/15   [name], MD  sucralfate (CARAFATE) 1 g tablet Take 1 tablet (1 g total) by mouth 4 (four) times daily -  with meals and at bedtime. 08/17/15   [name], MD    Allergies Chantix [varenicline] and Morphine and related  No family history on file.  Social History Social History  Substance Use Topics  . Smoking status: Current Every Day Smoker    Packs/day: 0.50    Types: Cigarettes  . Smokeless tobacco: Not on file  . Alcohol use No    Review of Systems Constitutional: Negative for fever. Cardiovascular: Negative for chest pain. Respiratory: Negative for shortness of breath. Gastrointestinal: Positive for diffuse abdominal pain. Genitourinary: Negative for dysuria. Neurological: Negative for headache 10-point ROS otherwise negative.  ____________________________________________   PHYSICAL EXAM:  Constitutional: Alert and oriented. Mild distress due to abdominal pain and nausea. Eyes: Normal exam ENT   Head: Normocephalic and atraumatic   Mouth/Throat: Mucous membranes are moist. Cardiovascular: Normal rate, regular rhythm. No murmur Respiratory: Normal respiratory effort without tachypnea nor retractions. Breath sounds are clear and equal bilaterally. No wheezes/rales/rhonchi. Gastrointestinal: Soft, moderate tenderness palpation in all quadrants, no quadrant more tender than any other quadrant.  No distention. No rebound or guarding. Musculoskeletal: Nontender with normal range of motion in all extremities.  Neurologic:  Normal speech and language. No gross focal neurologic  deficits  Skin:  Skin is warm, dry and intact.  Psychiatric: Mood and affect are normal.   ____________________________________________   RADIOLOGY  CT consistent with colitis  ____________________________________________   INITIAL IMPRESSION / ASSESSMENT AND PLAN / ED COURSE  Pertinent labs & imaging results that were available during my care of the patient were reviewed by me and considered in my medical decision making (see chart for details).  Patient presents the emergency department with diffuse abdominal pain, nausea and vomiting. Patient states this is somewhat common for her and happens every 1-2 months. Patient is in moderate distress currently due to the pain and nausea. We'll treat the patient's pain and nausea, obtain labs and a CT scan of her abdomen and pelvis further evaluate. Denies any chest pain or trouble breathing. Denies any fever.   CT scan shows diffuse colitis. We will start on antibiotics and admitted to the hospital given her leukocytosis of 19,000 nausea, vomiting, abdominal pain with near pan colitis.  ____________________________________________   FINAL CLINICAL IMPRESSION(S) / ED DIAGNOSES  Abdominal pain Nausea and vomiting Colitis   Minna Antis, MD 01/16/16 1944

## 2016-01-16 NOTE — ED Triage Notes (Signed)
Pt arrived via EMS from home c/o abdominal pain. Per EMS pt began vomiting this AM, at this time pt is actively vomiting  c/o of abdominal pain, nausea, vomiting, and diarrhea. Pt alert and oriented in no acute distress at this time.

## 2016-01-16 NOTE — Progress Notes (Signed)
Pt. C/o pain 10 out of 10. Dr. Anne Hahn ordered to give coming up dose 1 hr. Early.

## 2016-01-16 NOTE — H&P (Signed)
[facility] [place] at [facility]   PATIENT NAME: Lynn Larson    MR#:  [id number]  DATE OF BIRTH:  11/19/1965  DATE OF ADMISSION:  01/16/2016  PRIMARY CARE PHYSICIAN: No PCP Per Patient   REQUESTING/REFERRING PHYSICIAN: Dr. Minna Antis  CHIEF COMPLAINT:   Chief Complaint  Patient presents with  . Abdominal Pain    HISTORY OF PRESENT ILLNESS:  Lynn Larson  is a 50 y.o. female with a known history of Asthma, essential hypertension, ongoing tobacco abuse, previous history of C. Difficile, who presents to the hospital complaining abdominal pain nausea vomiting that began earlier today. Patient says that she was not feeling well yesterday but developed some nausea vomiting and diarrhea today. She has had 6 episodes of diarrhea today which has been watery in nature. She is also had multiple episodes of vomiting which is been nonbloody and bilious in nature. Her abdominal pain is more diffuse but worse in the right upper/epiGastric area. She presented to the hospital with the symptoms mentioned above underwent a CT scan of the abdomen and pelvis which shows diffuse colonic inflammation consistent with colitis. Hospitalist services were contacted further treatment and evaluation. Patient denies any previous history of inflammatory bowel disease. She also denies being on any recent antibiotics.  PAST MEDICAL HISTORY:   Past Medical History:  Diagnosis Date  . Asthma   . Hypertension     PAST SURGICAL HISTORY:   Past Surgical History:  Procedure Laterality Date  . CHOLECYSTECTOMY    . ESOPHAGOGASTRODUODENOSCOPY (EGD) WITH PROPOFOL [name] 08/15/2015   Procedure: ESOPHAGOGASTRODUODENOSCOPY (EGD) WITH PROPOFOL;  Surgeon: Christena Deem, MD;  Location: [facility] ENDOSCOPY;  Service: Endoscopy;  Laterality: [name];    SOCIAL HISTORY:   Social History  Substance Use Topics  . Smoking status: Current Every Day Smoker    Packs/day: 0.50    Years: 30.00    Types:  Cigarettes  . Smokeless tobacco: Never Used  . Alcohol use No    FAMILY HISTORY:   Family History  Problem Relation Age of Onset  . Breast cancer Maternal Grandmother   . Ovarian cancer Maternal Grandmother     DRUG ALLERGIES:   Allergies  Allergen Reactions  . Chantix [Varenicline]     Suicidal thoughts  . Morphine And Related Other (See Comments)    Reaction:  Redness of face     REVIEW OF SYSTEMS:   Review of Systems  Constitutional: Negative for fever and weight loss.  HENT: Negative for congestion, nosebleeds and tinnitus.   Eyes: Negative for blurred vision, double vision and redness.  Respiratory: Negative for cough, hemoptysis and shortness of breath.   Cardiovascular: Negative for chest pain, orthopnea, leg swelling and PND.  Gastrointestinal: Positive for abdominal pain, diarrhea, nausea and vomiting. Negative for melena.  Genitourinary: Negative for dysuria, hematuria and urgency.  Musculoskeletal: Negative for falls and joint pain.  Neurological: Negative for dizziness, tingling, sensory change, focal weakness, seizures, weakness and headaches.  Endo/Heme/Allergies: Negative for polydipsia. Does not bruise/bleed easily.  Psychiatric/Behavioral: Negative for depression and memory loss. The patient is not nervous/anxious.     MEDICATIONS AT HOME:   Prior to Admission medications   Medication Sig Start Date End Date Taking? Authorizing Provider  acetaminophen (TYLENOL) 500 MG tablet Take 1,000 mg by mouth every 6 (six) hours as needed for headache.   Yes Historical Provider, MD  alum & mag hydroxide-simeth (MAALOX/MYLANTA) 200-200-20 MG/5ML suspension Take 15-30 mLs by mouth 2 (two) times daily. 2 tablespoons  in the morning and 1 tablespoon in the evening.   Yes Historical Provider, MD  Calcium Carbonate Antacid (ALKA-SELTZER ANTACID PO) Take 2 tablets by mouth daily as needed (heartburn/ stomach pain due to ulcer.).   Yes Historical Provider, MD  dimenhyDRINATE  (DRAMAMINE) 50 MG tablet Take 50 mg by mouth daily.   Yes Historical Provider, MD  oxyCODONE-acetaminophen (ROXICET) 5-325 MG tablet Take 1 tablet by mouth every 6 (six) hours as needed. 08/17/15  Yes [name], MD      VITAL SIGNS:  Blood pressure 127/84, pulse 72, temperature 98.5 F (36.9 C), temperature source Oral, resp. rate 16, height 5' (1.524 m), weight 67.8 kg (149 lb 6.4 oz), SpO2 93 %.  PHYSICAL EXAMINATION:  Physical Exam  GENERAL:  50 y.o.-year-old patient lying in the bed in mild distress.  EYES: Pupils equal, round, reactive to light and accommodation. No scleral icterus. Extraocular muscles intact.  HEENT: Head atraumatic, normocephalic. Oropharynx and nasopharynx clear. No oropharyngeal erythema, moist oral mucosa  NECK:  Supple, no jugular venous distention. No thyroid enlargement, no tenderness.  LUNGS: Normal breath sounds bilaterally, no wheezing, rales, rhonchi. No use of accessory muscles of respiration.  CARDIOVASCULAR: S1, S2 RRR. No murmurs, rubs, gallops, clicks.  ABDOMEN: Soft, tender diffusely, no rebound, rigidity, nondistended. Bowel sounds Hypoactive. No organomegaly or mass.  EXTREMITIES: No pedal edema, cyanosis, or clubbing. + 2 pedal & radial pulses b/l.   NEUROLOGIC: Cranial nerves II through XII are intact. No focal Motor or sensory deficits appreciated b/l PSYCHIATRIC: The patient is alert and oriented x 3. Good affect.  SKIN: No obvious rash, lesion, or ulcer.   LABORATORY PANEL:   CBC  Recent Labs Lab 01/16/16 1707  WBC 19.2*  HGB 15.0  HCT 44.0  PLT 397   ------------------------------------------------------------------------------------------------------------------  Chemistries   Recent Labs Lab 01/16/16 1707  NA 143  K 3.3*  CL 106  CO2 26  GLUCOSE 132*  BUN 10  CREATININE 0.60  CALCIUM 9.7  AST 19  ALT 19  ALKPHOS 95  BILITOT 0.7    ------------------------------------------------------------------------------------------------------------------  Cardiac Enzymes No results for input(s): TROPONINI in the last 168 hours. ------------------------------------------------------------------------------------------------------------------  RADIOLOGY:  Ct Abdomen Pelvis W Contrast  Result Date: 01/16/2016 CLINICAL DATA:  Generalized abdominal pain.  Vomiting. EXAM: CT ABDOMEN AND PELVIS WITH CONTRAST TECHNIQUE: Multidetector CT imaging of the abdomen and pelvis was performed using the standard protocol following bolus administration of intravenous contrast. CONTRAST:  ISOVUE-300 IOPAMIDOL (ISOVUE-300) INJECTION 61% COMPARISON:  08/13/2015; 05/08/2015; 10/22/2013 FINDINGS: Lower chest: Limited visualization of the lower thorax is negative for focal airspace opacity or pleural effusion. Normal heart size.  No pericardial effusion. Hepatobiliary: Query mild nodularity hepatic (representative image 19 series 2). No discrete hepatic lesions. Post cholecystectomy. There is mild prominence of the common bile duct, measuring approximately 0.9 cm in greatest short axis coronal diameter (coronal image 38, series 5) with associated mild intrahepatic biliary duct dilatation, likely the sequela of post cholecystectomy state. No ascites. Pancreas: There is mild prominence of the pancreatic duct measuring approximately 4 mm in greatest oblique coronal diameter (image 32, series 5, similar to remote examination performed 10/2013. No discrete pancreatic mass or pancreatic atrophy. No peripancreatic stranding. Spleen: Normal appearance of the spleen Adrenals/Urinary Tract: There is symmetric enhancement and excretion of the bilateral kidneys. No definite renal stones on this postcontrast examination. Bilateral sub cm hypo attenuating renal lesions are too small to accurately characterize though favored to represent renal cysts. No urinary obstruction  or perinephric stranding. Normal appearance of the bilateral adrenal glands. Normal appearance of the urinary bladder given underdistention. Stomach/Bowel: A minimal amount of enteric contrast has been ingested and extends to the level of the mid small bowel. There is apparent diffuse wall thickening involving near the entirety of the colon without evidence of enteric obstruction or mesenteric stranding. Suspected minimal circumferential wall thickening involving the terminal ileum (representative image 47, series 2), again not resulting in enteric obstruction. Normal appearance of the retrocecal appendix. No pneumoperitoneum, pneumatosis or portal venous gas. Vascular/Lymphatic: Moderate amount of mixed calcified and noncalcified atherosclerotic plaque within a normal caliber abdominal aorta. The major branch vessels of the abdominal aorta appear patent on this non CTA examination. No bulky retroperitoneal, mesenteric, pelvic or inguinal lymphadenopathy. Reproductive: Normal appearance of the pelvic organs. No discrete adnexal lesion. No free fluid within the pelvic cul-de-sac. Other: Regional soft tissues appear normal. Musculoskeletal: No acute or aggressive osseous abnormalities. IMPRESSION: 1. Diffuse wall thickening involving the entirety of the colon extending to involve the terminal ileum, not resulting in enteric obstruction. Differential considerations are broad though include both infectious and inflammatory etiologies. No evidence of perforation or definable/drainable fluid collection. If not recently performed, further evaluation with colonoscopy after the resolution of acute symptoms is recommended. 2.  Aortic Atherosclerosis (ICD10-170.0) 3. Unchanged dilatation of the common bile and pancreatic ducts with mild centralized intrahepatic biliary duct dilatation, similar to remote abdominal CT performed 10/2013 and favored to be the sequela of post cholecystectomy state. 4. Query mild nodularity of the  hepatic contour potentially indicative of early cirrhotic change. Correlation with LFTs is recommended. Electronically Signed   By: Simonne Come M.D.   On: 01/16/2016 19:39    IMPRESSION AND PLAN:   50 year old female with past medical history of asthma, hypertension, previous history of C. difficile, ongoing tobacco abuse presents to the hospital due to abdominal pain nausea vomiting.  1. Acute colitis-this is the cause of patient's abdominal pain nausea vomiting. Unclear this is infectious versus inflammatory. -I will empirically place patient on IV Cipro, Flagyl. Get stool for comprehensive culture, C. difficile PCR. -Supportive care with IV fluids, antiemetics, pain control, placed on a clear liquid diet. -We'll also get a gastroenterology consult.  2. Leukocytosis-secondary to the colitis. Follow with IV abx therapy.  3. Hypokalemia-we'll place on potassium supplements and repeat level in the morning.    All the records are reviewed and case discussed with ED provider. Management plans discussed with the patient, family and they are in agreement.  CODE STATUS: Full  TOTAL TIME TAKING CARE OF THIS PATIENT: 45 minutes.    Houston Siren M.D on 01/16/2016 at 8:48 PM  Between 7am to 6pm - Pager - [phone]  After 6pm go to [email or web address] - password [email or web address] [facility]  [place] [facility]  Office  [phone]  CC: Primary care physician; No PCP Per Patient

## 2016-01-16 NOTE — ED Notes (Signed)
Patient transported to CT 

## 2016-01-17 LAB — BASIC METABOLIC PANEL
Anion gap: 9 (ref 5–15)
BUN: 10 mg/dL (ref 6–20)
CALCIUM: 9.1 mg/dL (ref 8.9–10.3)
CHLORIDE: 104 mmol/L (ref 101–111)
CO2: 29 mmol/L (ref 22–32)
CREATININE: 0.6 mg/dL (ref 0.44–1.00)
GFR calc non Af Amer: >60 mL/min (ref >60)
Glucose, Bld: 87 mg/dL (ref 65–99)
POTASSIUM: 3.2 mmol/L — AB (ref 3.5–5.1)
SODIUM: 142 mmol/L (ref 135–145)

## 2016-01-17 LAB — GASTROINTESTINAL PANEL BY PCR, STOOL (REPLACES STOOL CULTURE)
ADENOVIRUS F40/41: NOT DETECTED
ASTROVIRUS: NOT DETECTED
CAMPYLOBACTER SPECIES: NOT DETECTED
CYCLOSPORA CAYETANENSIS: NOT DETECTED
Cryptosporidium: NOT DETECTED
E. coli O157: NOT DETECTED
ENTEROPATHOGENIC E COLI (EPEC): NOT DETECTED
ENTEROTOXIGENIC E COLI (ETEC): NOT DETECTED
Entamoeba histolytica: NOT DETECTED
Enteroaggregative E coli (EAEC): NOT DETECTED
Giardia lamblia: NOT DETECTED
NOROVIRUS GI/GII: NOT DETECTED
PLESIMONAS SHIGELLOIDES: NOT DETECTED
ROTAVIRUS A: NOT DETECTED
SAPOVIRUS (I, II, IV, AND V): NOT DETECTED
SHIGA LIKE TOXIN PRODUCING E COLI (STEC): NOT DETECTED
Salmonella species: NOT DETECTED
Shigella/Enteroinvasive E coli (EIEC): NOT DETECTED
VIBRIO SPECIES: NOT DETECTED
Vibrio cholerae: NOT DETECTED
Yersinia enterocolitica: NOT DETECTED

## 2016-01-17 LAB — CBC
HCT: 39.9 % (ref 35.0–47.0)
HEMOGLOBIN: 13.7 g/dL (ref 12.0–16.0)
MCH: 29.4 pg (ref 26.0–34.0)
MCHC: 34.2 g/dL (ref 32.0–36.0)
MCV: 85.9 fL (ref 80.0–100.0)
PLATELETS: 357 10*3/uL (ref 150–440)
RBC: 4.65 MIL/uL (ref 3.80–5.20)
RDW: 15.1 % — ABNORMAL HIGH (ref 11.5–14.5)
WBC: 13.8 10*3/uL — ABNORMAL HIGH (ref 3.6–11.0)

## 2016-01-17 LAB — C DIFFICILE QUICK SCREEN W PCR REFLEX
C Diff antigen: NEGATIVE
C Diff interpretation: NOT DETECTED
C Diff toxin: NEGATIVE

## 2016-01-17 MED ORDER — OXYCODONE-ACETAMINOPHEN 5-325 MG PO TABS: 1.0000 | ORAL_TABLET | Freq: Four times a day (QID) | ORAL | Status: DC | PRN

## 2016-01-17 MED ORDER — PROMETHAZINE HCL 25 MG/ML IJ SOLN
12.5000 mg | Freq: Four times a day (QID) | INTRAMUSCULAR | Status: DC | PRN
  Administered 2016-01-17: 25 mg via INTRAVENOUS
  Filled 2016-01-17: qty 1

## 2016-01-17 MED ORDER — HYDROMORPHONE HCL 1 MG/ML IJ SOLN
2.0000 mg | INTRAMUSCULAR | Status: DC | PRN
  Administered 2016-01-17 – 2016-01-18 (×8): 2 mg via INTRAVENOUS
  Filled 2016-01-17 (×9): qty 2

## 2016-01-17 MED ORDER — POTASSIUM CHLORIDE IN NACL 40-0.9 MEQ/L-% IV SOLN
INTRAVENOUS | Status: DC
  Administered 2016-01-17 – 2016-01-19 (×4): 100 mL/h via INTRAVENOUS
  Filled 2016-01-17 (×9): qty 1000

## 2016-01-17 NOTE — Care Management Note (Signed)
Case Management Note  Patient Details  Name: Runa Pedraza MRN: [id number] Date of Birth: [birth year]  Subjective/Objective:  Spoke with patient for discharge planning, patein is from home with her husband who has a history of CVA and is on disability. Patient needs assistance with medications and PCP. Stated that she has tried [facility] but cant go during the hours they are open due to her odd work hours. Application given for [facility] and referral sent ,  Given list of [facility] providers Patient encouraged to call and make appointment prior to discharge. Will assist patient with follow up appointment at discharge.               Action/Plan: Continue to follow.   Expected Discharge Date:  01/18/16               Expected Discharge Plan:  Home/Self Care  In-House Referral:     Discharge planning Services  CM Consult, Medication Assistance, Indigent Health Clinic  Post Acute Care Choice:    Choice offered to:     DME Arranged:    DME Agency:     HH Arranged:    HH Agency:     Status of Service:  In process, will continue to follow  If discussed at Long Length of Stay Meetings, dates discussed:    Additional Comments:  Adonis Huguenin, RN 01/17/2016, 3:49 PM

## 2016-01-17 NOTE — Progress Notes (Addendum)
[facility] - [place] at [facility]   PATIENT NAME: Lynn Larson    MR#:  [id number]  DATE OF BIRTH:  [birth year]  SUBJECTIVE:   Came in with acute onset diarrhea,cramping abdominal pain and nausea. Found to have colitis Pt requesting more on her tray. Does not want broth ad jello REVIEW OF SYSTEMS:   Review of Systems  Constitutional: Negative for chills, fever and weight loss.  HENT: Negative for ear discharge, ear pain and nosebleeds.   Eyes: Negative for blurred vision, pain and discharge.  Respiratory: Negative for sputum production, shortness of breath, wheezing and stridor.   Cardiovascular: Negative for chest pain, palpitations, orthopnea and PND.  Gastrointestinal: Positive for abdominal pain, diarrhea and nausea. Negative for vomiting.  Genitourinary: Negative for frequency and urgency.  Musculoskeletal: Negative for back pain and joint pain.  Neurological: Positive for weakness. Negative for sensory change, speech change and focal weakness.  Psychiatric/Behavioral: Negative for depression and hallucinations. The patient is not nervous/anxious.    Tolerating Diet:FLD Tolerating PT: ambulatory  DRUG ALLERGIES:   Allergies  Allergen Reactions  . Chantix [Varenicline]     Suicidal thoughts  . Morphine And Related Other (See Comments)    Reaction:  Redness of face     VITALS:  Blood pressure 118/72, pulse 66, temperature 98.6 F (37 C), temperature source Oral, resp. rate 18, height 5\' 3"  (1.6 m), weight 72.1 kg (158 lb 14.4 oz), SpO2 96 %.  PHYSICAL EXAMINATION:   Physical Exam  GENERAL:  50 y.o.-year-old patient lying in the bed with no acute distress.  EYES: Pupils equal, round, reactive to light and accommodation. No scleral icterus. Extraocular muscles intact.  HEENT: Head atraumatic, normocephalic. Oropharynx and nasopharynx clear.  NECK:  Supple, no jugular venous distention. No thyroid enlargement, no tenderness.  LUNGS: Normal  breath sounds bilaterally, no wheezing, rales, rhonchi. No use of accessory muscles of respiration.  CARDIOVASCULAR: S1, S2 normal. No murmurs, rubs, or gallops.  ABDOMEN: Soft, diffuse tenderness+, nondistended. Bowel sounds present. No organomegaly or mass.  EXTREMITIES: No cyanosis, clubbing or edema b/l.    NEUROLOGIC: Cranial nerves II through XII are intact. No focal Motor or sensory deficits b/l.   PSYCHIATRIC:  patient is alert and oriented x 3.  SKIN: No obvious rash, lesion, or ulcer.   LABORATORY PANEL:  CBC  Recent Labs Lab 01/17/16 0329  WBC 13.8*  HGB 13.7  HCT 39.9  PLT 357    Chemistries   Recent Labs Lab 01/16/16 1707 01/17/16 0329  NA 143 142  K 3.3* 3.2*  CL 106 104  CO2 26 29  GLUCOSE 132* 87  BUN 10 10  CREATININE 0.60 0.60  CALCIUM 9.7 9.1  AST 19  --   ALT 19  --   ALKPHOS 95  --   BILITOT 0.7  --    Cardiac Enzymes No results for input(s): TROPONINI in the last 168 hours. RADIOLOGY:  Ct Abdomen Pelvis W Contrast  Result Date: 01/16/2016 CLINICAL DATA:  Generalized abdominal pain.  Vomiting. EXAM: CT ABDOMEN AND PELVIS WITH CONTRAST TECHNIQUE: Multidetector CT imaging of the abdomen and pelvis was performed using the standard protocol following bolus administration of intravenous contrast. CONTRAST:  ISOVUE-300 IOPAMIDOL (ISOVUE-300) INJECTION 61% COMPARISON:  08/13/2015; 05/08/2015; 10/22/2013 FINDINGS: Lower chest: Limited visualization of the lower thorax is negative for focal airspace opacity or pleural effusion. Normal heart size.  No pericardial effusion. Hepatobiliary: Query mild nodularity hepatic (representative image 19 series 2). No discrete  hepatic lesions. Post cholecystectomy. There is mild prominence of the common bile duct, measuring approximately 0.9 cm in greatest short axis coronal diameter (coronal image 38, series 5) with associated mild intrahepatic biliary duct dilatation, likely the sequela of post cholecystectomy state.  No ascites. Pancreas: There is mild prominence of the pancreatic duct measuring approximately 4 mm in greatest oblique coronal diameter (image 32, series 5, similar to remote examination performed 10/2013. No discrete pancreatic mass or pancreatic atrophy. No peripancreatic stranding. Spleen: Normal appearance of the spleen Adrenals/Urinary Tract: There is symmetric enhancement and excretion of the bilateral kidneys. No definite renal stones on this postcontrast examination. Bilateral sub cm hypo attenuating renal lesions are too small to accurately characterize though favored to represent renal cysts. No urinary obstruction or perinephric stranding. Normal appearance of the bilateral adrenal glands. Normal appearance of the urinary bladder given underdistention. Stomach/Bowel: A minimal amount of enteric contrast has been ingested and extends to the level of the mid small bowel. There is apparent diffuse wall thickening involving near the entirety of the colon without evidence of enteric obstruction or mesenteric stranding. Suspected minimal circumferential wall thickening involving the terminal ileum (representative image 47, series 2), again not resulting in enteric obstruction. Normal appearance of the retrocecal appendix. No pneumoperitoneum, pneumatosis or portal venous gas. Vascular/Lymphatic: Moderate amount of mixed calcified and noncalcified atherosclerotic plaque within a normal caliber abdominal aorta. The major branch vessels of the abdominal aorta appear patent on this non CTA examination. No bulky retroperitoneal, mesenteric, pelvic or inguinal lymphadenopathy. Reproductive: Normal appearance of the pelvic organs. No discrete adnexal lesion. No free fluid within the pelvic cul-de-sac. Other: Regional soft tissues appear normal. Musculoskeletal: No acute or aggressive osseous abnormalities. IMPRESSION: 1. Diffuse wall thickening involving the entirety of the colon extending to involve the terminal  ileum, not resulting in enteric obstruction. Differential considerations are broad though include both infectious and inflammatory etiologies. No evidence of perforation or definable/drainable fluid collection. If not recently performed, further evaluation with colonoscopy after the resolution of acute symptoms is recommended. 2.  Aortic Atherosclerosis (ICD10-170.0) 3. Unchanged dilatation of the common bile and pancreatic ducts with mild centralized intrahepatic biliary duct dilatation, similar to remote abdominal CT performed 10/2013 and favored to be the sequela of post cholecystectomy state. 4. Query mild nodularity of the hepatic contour potentially indicative of early cirrhotic change. Correlation with LFTs is recommended. Electronically Signed   By: Simonne Come M.D.   On: 01/16/2016 19:39  ASSESSMENT AND PLAN:  50 year old female with past medical history of asthma, hypertension, previous history of C. difficile, ongoing tobacco abuse presents to the hospital due to abdominal pain nausea vomiting.  1. Acute colitis -this is the cause of patient's abdominal pain nausea vomiting. Unclear this is infectious versus inflammatory. - on IV Cipro, Flagyl. - C. difficile PCR.- negative -Supportive care with IV fluids, antiemetics, pain control, placed on a clear liquid diet. -We'll also get a gastroenterology consult if needed. No coverage today.  2. Leukocytosis-secondary to the colitis. Follow with IV abx therapy.  3. Hypokalemia-we'll place on potassium supplements and repeat level in the morning.  4.DVT prophylaxis lovenox  Case discussed with Care Management/Social Worker. Management plans discussed with the patient, family and they are in agreement.  CODE STATUS: FULL  DVT Prophylaxis:lovenox  TOTAL TIME TAKING CARE OF THIS PATIENT: 30 minutes.  >50% time spent on counselling and coordination of care pt  POSSIBLE D/C IN 1-2  DAYS, DEPENDING ON CLINICAL CONDITION.  Note: This  dictation was prepared with Dragon dictation along with smaller phrase technology. Any transcriptional errors that result from this process are unintentional.  Champayne Kocian M.D on 01/17/2016 at 10:59 AM  Between 7am to 6pm - Pager - [phone]  After 6pm go to [email or web address] - password [email or web address] [facility]  [place] [facility]  Office  [phone]  CC: Primary care physician; No PCP Per Patient

## 2016-01-17 NOTE — Progress Notes (Signed)
Dr. Anne Hahn notified of pt. Still being nauseated after receiving zofran. Phenergan ordered.

## 2016-01-18 DIAGNOSIS — R1013 Epigastric pain: Secondary | ICD-10-CM

## 2016-01-18 DIAGNOSIS — R1084 Generalized abdominal pain: Secondary | ICD-10-CM

## 2016-01-18 DIAGNOSIS — K529 Noninfective gastroenteritis and colitis, unspecified: Principal | ICD-10-CM

## 2016-01-18 LAB — BASIC METABOLIC PANEL
Anion gap: 7 (ref 5–15)
BUN: 6 mg/dL (ref 6–20)
CALCIUM: 9.1 mg/dL (ref 8.9–10.3)
CO2: 27 mmol/L (ref 22–32)
CREATININE: 0.57 mg/dL (ref 0.44–1.00)
Chloride: 107 mmol/L (ref 101–111)
GFR calc Af Amer: >60 mL/min (ref >60)
GLUCOSE: 94 mg/dL (ref 65–99)
POTASSIUM: 3.7 mmol/L (ref 3.5–5.1)
SODIUM: 141 mmol/L (ref 135–145)

## 2016-01-18 MED ORDER — DICYCLOMINE HCL 20 MG PO TABS
10.0000 mg | ORAL_TABLET | Freq: Three times a day (TID) | ORAL | Status: DC
  Administered 2016-01-18 – 2016-01-20 (×6): 10 mg via ORAL
  Filled 2016-01-18 (×6): qty 1

## 2016-01-18 MED ORDER — METOCLOPRAMIDE HCL 5 MG/ML IJ SOLN
10.0000 mg | Freq: Four times a day (QID) | INTRAMUSCULAR | Status: DC
  Administered 2016-01-18 – 2016-01-19 (×6): 10 mg via INTRAVENOUS
  Filled 2016-01-18 (×6): qty 2

## 2016-01-18 MED ORDER — HYDROMORPHONE HCL 1 MG/ML IJ SOLN
2.0000 mg | INTRAMUSCULAR | Status: DC | PRN
  Administered 2016-01-18 – 2016-01-19 (×4): 2 mg via INTRAVENOUS
  Filled 2016-01-18 (×4): qty 2

## 2016-01-18 MED ORDER — PANTOPRAZOLE SODIUM 40 MG PO TBEC
40.0000 mg | DELAYED_RELEASE_TABLET | Freq: Two times a day (BID) | ORAL | Status: DC
  Administered 2016-01-18 – 2016-01-20 (×5): 40 mg via ORAL
  Filled 2016-01-18 (×5): qty 1

## 2016-01-18 MED ORDER — OXYCODONE-ACETAMINOPHEN 5-325 MG PO TABS
1.0000 | ORAL_TABLET | Freq: Four times a day (QID) | ORAL | Status: DC | PRN
  Administered 2016-01-18 – 2016-01-19 (×4): 2 via ORAL
  Filled 2016-01-18 (×4): qty 2

## 2016-01-18 MED ORDER — SUCRALFATE 1 GM/10ML PO SUSP
1.0000 g | Freq: Three times a day (TID) | ORAL | Status: DC
  Administered 2016-01-18 – 2016-01-20 (×8): 1 g via ORAL
  Filled 2016-01-18 (×8): qty 10

## 2016-01-18 MED ORDER — ONDANSETRON HCL 4 MG/2ML IJ SOLN
4.0000 mg | Freq: Four times a day (QID) | INTRAMUSCULAR | Status: DC
  Administered 2016-01-18 – 2016-01-19 (×6): 4 mg via INTRAVENOUS
  Filled 2016-01-18 (×6): qty 2

## 2016-01-18 NOTE — Progress Notes (Signed)
Pain education discussed with patient and use of PRN PO medication. Patient denies and request to have IV pain medication.

## 2016-01-18 NOTE — Care Management (Signed)
Spoke with patient again after discussing care with Dr Nemiah Commander. Patient will need to go home on Cipro and also Flagyl. Gave patient coupons for medications at [facility] totaling 15.85. Patietn stated that she could not afford the $15.85 so I ran a [employer] voucher for patieint and she will have $6 dollar co pay for these meds. Voucher given to patient. Patient stated that she would have to borrow th money to get medicines but that she would be able to. Re enforced that she must take her medications as directed at discharge or that she would end up coming back to hospital.  I arranged a follow up appointment for the patient with [facility] for Aug 1 at 2 pm gave patient appointment slip and placed on discharge instrucitions . No othetr needs anticipated discharge  Is tomorrow.

## 2016-01-18 NOTE — Consult Note (Signed)
Lynn Minium, MD [facility]  [address] [place], [place] [address] Phone: [phone] Fax : [phone]  Consultation  Referring Provider:     No ref. provider found Primary Care Physician:  No PCP Per Patient Primary Gastroenterologist:  Dr. Marva Panda         Reason for Consultation:     Abdominal pain nausea and colitis  Date of Admission:  01/16/2016 Date of Consultation:  01/18/2016         HPI:   Lynn Larson is a 50 y.o. female who was admitted with abdominal pain. The patient had a CT scan that showed colitis. The patient has been to the emergency room multiple times and was last seen by Dr. Marva Panda with an upper endoscopy back in February. The patient was also seen in the ER back in November of last year in August of last year. Both times the patient was diagnosed with colitis from a thickened intestinal wall on CT and she had an upper endoscopy that showed esophagitis with gastritis. The patient reports that she has not taken any medication because she cannot afford it. The patient does smoke which also can increase her esophagitis. The patient states that she has no healthcare and has not followed up with anybody. She also denies having a primary care physician. She has been tolerating a clear liquid diet and was admitted 2 days ago but the consult was called today. The patient has not been having any black stools or bloody stools. She does report that she was out at the beach and was doing well last week but then when she got home started to have a lot of diffuse abdominal pain. The patient is quite emotional when she talks about not being able to have healthcare coverage.  Past Medical History:  Diagnosis Date  . Asthma   . Hypertension     Past Surgical History:  Procedure Laterality Date  . CHOLECYSTECTOMY    . ESOPHAGOGASTRODUODENOSCOPY (EGD) WITH PROPOFOL [name] 08/15/2015   Procedure: ESOPHAGOGASTRODUODENOSCOPY (EGD) WITH PROPOFOL;  Surgeon: Christena Deem, MD;   Location: [facility] ENDOSCOPY;  Service: Endoscopy;  Laterality: [name];    Prior to Admission medications   Medication Sig Start Date End Date Taking? Authorizing Provider  acetaminophen (TYLENOL) 500 MG tablet Take 1,000 mg by mouth every 6 (six) hours as needed for headache.   Yes Historical Provider, MD  alum & mag hydroxide-simeth (MAALOX/MYLANTA) 200-200-20 MG/5ML suspension Take 15-30 mLs by mouth 2 (two) times daily. 2 tablespoons in the morning and 1 tablespoon in the evening.   Yes Historical Provider, MD  Calcium Carbonate Antacid (ALKA-SELTZER ANTACID PO) Take 2 tablets by mouth daily as needed (heartburn/ stomach pain due to ulcer.).   Yes Historical Provider, MD  dimenhyDRINATE (DRAMAMINE) 50 MG tablet Take 50 mg by mouth daily.   Yes Historical Provider, MD  oxyCODONE-acetaminophen (ROXICET) 5-325 MG tablet Take 1 tablet by mouth every 6 (six) hours as needed. 08/17/15  Yes [name], MD    Family History  Problem Relation Age of Onset  . Breast cancer Maternal Grandmother   . Ovarian cancer Maternal Grandmother      Social History  Substance Use Topics  . Smoking status: Current Every Day Smoker    Packs/day: 0.50    Years: 30.00    Types: Cigarettes  . Smokeless tobacco: Never Used  . Alcohol use No    Allergies as of 01/16/2016 - Review Complete 01/16/2016  Allergen Reaction Noted  . Chantix [varenicline]  08/14/2015  . Morphine and related Other (See Comments) 02/04/2015    Review of Systems:    All systems reviewed and negative except where noted in HPI.   Physical Exam:  Vital signs in last 24 hours: Temp:  [97.6 F (36.4 C)-98.5 F (36.9 C)] 98.1 F (36.7 C) (07/28 0723) Pulse Rate:  [49-61] 54 (07/28 0723) Resp:  [18] 18 (07/28 0723) BP: (124-153)/(72-84) 153/83 (07/28 0723) SpO2:  [94 %-98 %] 98 % (07/28 0723) Last BM Date: 01/17/16 General:   Pleasant, cooperative in NAD Head:  Normocephalic and atraumatic. Eyes:   No icterus.   Conjunctiva pink.  PERRLA. Ears:  Normal auditory acuity. Neck:  Supple; no masses or thyroidomegaly Lungs: Respirations even and unlabored. Lungs clear to auscultation bilaterally.   No wheezes, crackles, or rhonchi.  Heart:  Regular rate and rhythm;  Without murmur, clicks, rubs or gallops Abdomen:  Soft, nondistended, Diffuse mild tenderness. Normal bowel sounds. No appreciable masses or hepatomegaly.  No rebound or guarding.  Rectal:  Not performed. Msk:  Symmetrical without gross deformities.   Extremities:  Without edema, cyanosis or clubbing. Neurologic:  Alert and oriented x3;  grossly normal neurologically. Skin:  Intact without significant lesions or rashes. Cervical Nodes:  No significant cervical adenopathy. Psych:  Alert and cooperative. Normal affect.  LAB RESULTS:  Recent Labs  01/16/16 1707 01/17/16 0329  WBC 19.2* 13.8*  HGB 15.0 13.7  HCT 44.0 39.9  PLT 397 357   BMET  Recent Labs  01/16/16 1707 01/17/16 0329 01/18/16 0822  NA 143 142 141  K 3.3* 3.2* 3.7  CL 106 104 107  CO2 26 29 27   GLUCOSE 132* 87 94  BUN 10 10 6   CREATININE 0.60 0.60 0.57  CALCIUM 9.7 9.1 9.1   LFT  Recent Labs  01/16/16 1707  PROT 8.0  ALBUMIN 4.7  AST 19  ALT 19  ALKPHOS 95  BILITOT 0.7   PT/INR No results for input(s): LABPROT, INR in the last 72 hours.  STUDIES: Ct Abdomen Pelvis W Contrast  Result Date: 01/16/2016 CLINICAL DATA:  Generalized abdominal pain.  Vomiting. EXAM: CT ABDOMEN AND PELVIS WITH CONTRAST TECHNIQUE: Multidetector CT imaging of the abdomen and pelvis was performed using the standard protocol following bolus administration of intravenous contrast. CONTRAST:  ISOVUE-300 IOPAMIDOL (ISOVUE-300) INJECTION 61% COMPARISON:  08/13/2015; 05/08/2015; 10/22/2013 FINDINGS: Lower chest: Limited visualization of the lower thorax is negative for focal airspace opacity or pleural effusion. Normal heart size.  No pericardial effusion. Hepatobiliary: Query mild nodularity  hepatic (representative image 19 series 2). No discrete hepatic lesions. Post cholecystectomy. There is mild prominence of the common bile duct, measuring approximately 0.9 cm in greatest short axis coronal diameter (coronal image 38, series 5) with associated mild intrahepatic biliary duct dilatation, likely the sequela of post cholecystectomy state. No ascites. Pancreas: There is mild prominence of the pancreatic duct measuring approximately 4 mm in greatest oblique coronal diameter (image 32, series 5, similar to remote examination performed 10/2013. No discrete pancreatic mass or pancreatic atrophy. No peripancreatic stranding. Spleen: Normal appearance of the spleen Adrenals/Urinary Tract: There is symmetric enhancement and excretion of the bilateral kidneys. No definite renal stones on this postcontrast examination. Bilateral sub cm hypo attenuating renal lesions are too small to accurately characterize though favored to represent renal cysts. No urinary obstruction or perinephric stranding. Normal appearance of the bilateral adrenal glands. Normal appearance of the urinary bladder given underdistention. Stomach/Bowel: A minimal amount of enteric contrast has been  ingested and extends to the level of the mid small bowel. There is apparent diffuse wall thickening involving near the entirety of the colon without evidence of enteric obstruction or mesenteric stranding. Suspected minimal circumferential wall thickening involving the terminal ileum (representative image 47, series 2), again not resulting in enteric obstruction. Normal appearance of the retrocecal appendix. No pneumoperitoneum, pneumatosis or portal venous gas. Vascular/Lymphatic: Moderate amount of mixed calcified and noncalcified atherosclerotic plaque within a normal caliber abdominal aorta. The major branch vessels of the abdominal aorta appear patent on this non CTA examination. No bulky retroperitoneal, mesenteric, pelvic or inguinal  lymphadenopathy. Reproductive: Normal appearance of the pelvic organs. No discrete adnexal lesion. No free fluid within the pelvic cul-de-sac. Other: Regional soft tissues appear normal. Musculoskeletal: No acute or aggressive osseous abnormalities. IMPRESSION: 1. Diffuse wall thickening involving the entirety of the colon extending to involve the terminal ileum, not resulting in enteric obstruction. Differential considerations are broad though include both infectious and inflammatory etiologies. No evidence of perforation or definable/drainable fluid collection. If not recently performed, further evaluation with colonoscopy after the resolution of acute symptoms is recommended. 2.  Aortic Atherosclerosis (ICD10-170.0) 3. Unchanged dilatation of the common bile and pancreatic ducts with mild centralized intrahepatic biliary duct dilatation, similar to remote abdominal CT performed 10/2013 and favored to be the sequela of post cholecystectomy state. 4. Query mild nodularity of the hepatic contour potentially indicative of early cirrhotic change. Correlation with LFTs is recommended. Electronically Signed   By: Simonne Come M.D.   On: 01/16/2016 19:39     Impression / Plan:   Lynn Larson is a 50 y.o. y/o female with Recurrent abdominal pain with a history of gastritis. The patient also had esophagitis at her last EGD. The patient states that she had a colonoscopy last year although no record of this could be found. The patient has had multiple admissions to the emergency room back in August and November 2016 and February of this year all with the diagnosis of epigastric pain nausea vomiting and colitis. Since previous CT back in 2016 showed thickening of the intestines. The patient has been told that she should undergo a colonoscopy. She has been having less nausea and less abdominal pain and her white cell count has come down. The patient has been told to call Dr. Reyes Ivan office and get set up for  colonoscopy as an outpatient. It is unlikely that she will need any endoscopic procedures as an inpatient during this coming weekend. There'll be no GI coverage tomorrow or the following day. If the patient remains in the hospital on Monday she may be prepped for a possible colonoscopy early next week. I have discussed the plan with the hospitalist.   Thank you for involving me in the care of this patient.      LOS: 2 days   Lynn Minium, MD  01/18/2016, 1:02 PM   Note: This dictation was prepared with Dragon dictation along with smaller phrase technology. Any transcriptional errors that result from this process are unintentional.

## 2016-01-18 NOTE — Progress Notes (Signed)
[facility] - [place] at [facility]   PATIENT NAME: Lynn Larson    MR#:  [id number]  DATE OF BIRTH:  04-18-1966  SUBJECTIVE:  CHIEF COMPLAINT:   Chief Complaint  Patient presents with  . Abdominal Pain   - very emotional- depressed- not suicidal - has significant nausea/vomiting - still has abdominal pain requesting for dilaudid  REVIEW OF SYSTEMS:  Review of Systems  Constitutional: Negative for chills, fever and malaise/fatigue.  HENT: Negative for ear discharge, ear pain and nosebleeds.   Respiratory: Negative for cough, shortness of breath and wheezing.   Cardiovascular: Negative for chest pain, palpitations and leg swelling.  Gastrointestinal: Positive for abdominal pain, nausea and vomiting. Negative for constipation and diarrhea.  Genitourinary: Negative for dysuria.  Neurological: Negative for dizziness, seizures and headaches.  Psychiatric/Behavioral: Positive for depression. The patient is nervous/anxious.     DRUG ALLERGIES:   Allergies  Allergen Reactions  . Chantix [Varenicline]     Suicidal thoughts  . Morphine And Related Other (See Comments)    Reaction:  Redness of face     VITALS:  Blood pressure (!) 153/83, pulse (!) 54, temperature 98.1 F (36.7 C), temperature source Oral, resp. rate 18, height 5\' 3"  (1.6 m), weight 72.1 kg (158 lb 14.4 oz), SpO2 98 %.  PHYSICAL EXAMINATION:  Physical Exam  GENERAL:  50 y.o.-year-old patient lying in the bed with no acute distress.  EYES: Pupils equal, round, reactive to light and accommodation. No scleral icterus. Extraocular muscles intact.  HEENT: Head atraumatic, normocephalic. Oropharynx and nasopharynx clear.  NECK:  Supple, no jugular venous distention. No thyroid enlargement, no tenderness.  LUNGS: Normal breath sounds bilaterally, no wheezing, rales,rhonchi or crepitation. No use of accessory muscles of respiration.  CARDIOVASCULAR: S1, S2 normal. No murmurs, rubs, or gallops.   ABDOMEN: Soft, diffuse tenderness all over, voluntary guarding, nondistended. Bowel sounds present. No organomegaly or mass.  EXTREMITIES: No pedal edema, cyanosis, or clubbing.  NEUROLOGIC: Cranial nerves II through XII are intact. Muscle strength 5/5 in all extremities. Sensation intact. Gait not checked.  PSYCHIATRIC: The patient is alert and oriented x 3. Emotional SKIN: No obvious rash, lesion, or ulcer.    LABORATORY PANEL:   CBC  Recent Labs Lab 01/17/16 0329  WBC 13.8*  HGB 13.7  HCT 39.9  PLT 357   ------------------------------------------------------------------------------------------------------------------  Chemistries   Recent Labs Lab 01/16/16 1707  01/18/16 0822  NA 143  < > 141  K 3.3*  < > 3.7  CL 106  < > 107  CO2 26  < > 27  GLUCOSE 132*  < > 94  BUN 10  < > 6  CREATININE 0.60  < > 0.57  CALCIUM 9.7  < > 9.1  AST 19  --   --   ALT 19  --   --   ALKPHOS 95  --   --   BILITOT 0.7  --   --   < > = values in this interval not displayed. ------------------------------------------------------------------------------------------------------------------  Cardiac Enzymes No results for input(s): TROPONINI in the last 168 hours. ------------------------------------------------------------------------------------------------------------------  RADIOLOGY:  Ct Abdomen Pelvis W Contrast  Result Date: 01/16/2016 CLINICAL DATA:  Generalized abdominal pain.  Vomiting. EXAM: CT ABDOMEN AND PELVIS WITH CONTRAST TECHNIQUE: Multidetector CT imaging of the abdomen and pelvis was performed using the standard protocol following bolus administration of intravenous contrast. CONTRAST:  ISOVUE-300 IOPAMIDOL (ISOVUE-300) INJECTION 61% COMPARISON:  08/13/2015; 05/08/2015; 10/22/2013 FINDINGS: Lower chest: Limited visualization of  the lower thorax is negative for focal airspace opacity or pleural effusion. Normal heart size.  No pericardial effusion. Hepatobiliary:  Query mild nodularity hepatic (representative image 19 series 2). No discrete hepatic lesions. Post cholecystectomy. There is mild prominence of the common bile duct, measuring approximately 0.9 cm in greatest short axis coronal diameter (coronal image 38, series 5) with associated mild intrahepatic biliary duct dilatation, likely the sequela of post cholecystectomy state. No ascites. Pancreas: There is mild prominence of the pancreatic duct measuring approximately 4 mm in greatest oblique coronal diameter (image 32, series 5, similar to remote examination performed 10/2013. No discrete pancreatic mass or pancreatic atrophy. No peripancreatic stranding. Spleen: Normal appearance of the spleen Adrenals/Urinary Tract: There is symmetric enhancement and excretion of the bilateral kidneys. No definite renal stones on this postcontrast examination. Bilateral sub cm hypo attenuating renal lesions are too small to accurately characterize though favored to represent renal cysts. No urinary obstruction or perinephric stranding. Normal appearance of the bilateral adrenal glands. Normal appearance of the urinary bladder given underdistention. Stomach/Bowel: A minimal amount of enteric contrast has been ingested and extends to the level of the mid small bowel. There is apparent diffuse wall thickening involving near the entirety of the colon without evidence of enteric obstruction or mesenteric stranding. Suspected minimal circumferential wall thickening involving the terminal ileum (representative image 47, series 2), again not resulting in enteric obstruction. Normal appearance of the retrocecal appendix. No pneumoperitoneum, pneumatosis or portal venous gas. Vascular/Lymphatic: Moderate amount of mixed calcified and noncalcified atherosclerotic plaque within a normal caliber abdominal aorta. The major branch vessels of the abdominal aorta appear patent on this non CTA examination. No bulky retroperitoneal, mesenteric, pelvic  or inguinal lymphadenopathy. Reproductive: Normal appearance of the pelvic organs. No discrete adnexal lesion. No free fluid within the pelvic cul-de-sac. Other: Regional soft tissues appear normal. Musculoskeletal: No acute or aggressive osseous abnormalities. IMPRESSION: 1. Diffuse wall thickening involving the entirety of the colon extending to involve the terminal ileum, not resulting in enteric obstruction. Differential considerations are broad though include both infectious and inflammatory etiologies. No evidence of perforation or definable/drainable fluid collection. If not recently performed, further evaluation with colonoscopy after the resolution of acute symptoms is recommended. 2.  Aortic Atherosclerosis (ICD10-170.0) 3. Unchanged dilatation of the common bile and pancreatic ducts with mild centralized intrahepatic biliary duct dilatation, similar to remote abdominal CT performed 10/2013 and favored to be the sequela of post cholecystectomy state. 4. Query mild nodularity of the hepatic contour potentially indicative of early cirrhotic change. Correlation with LFTs is recommended. Electronically Signed   By: Simonne Come M.D.   On: 01/16/2016 19:39   EKG:   Orders placed or performed during the hospital encounter of 12/28/15  . ED EKG  . ED EKG  . EKG 12-Lead  . EKG 12-Lead  . EKG 12-Lead  . EKG 12-Lead  . EKG    ASSESSMENT AND PLAN:   50y/o F with Past medical history significant for depression and anxiety, hypertension, asthma, recurrent colitis presents to the hospital secondary to abdominal pain, nausea and vomiting.  #1 Acute Colitis- several admissions, ER visits due to colitis in the past. -Continue Cipro and Flagyl. Still remains very nauseous and vomiting. -Requesting pain medications -Not sure if has gastroparesis as complains of early morning nausea and vomiting even at home. -Start scheduled Zofran and Reglan for today. -Change back to clear liquid diet. -Appreciate  GI consult. Will need colonoscopy either as inpatient versus outpatient. -Bentyl added  for abdominal cramping pain  #2 GERD-EGD in February 2017 showing severe esophagitis and multiple nonbleeding gastric ulcers. Noncompliant with medications as outpatient due to financial reasons. -Added Protonix and sucralfate.  #3 depression and anxiety-denies suicidal ideation. Refused to be started on any new medications here in the hospital.  #4 DVT prophylaxis-on Lovenox   Possible discharge in the next 1-2 days as symptoms improve and diet is advanced     All the records are reviewed and case discussed with Care Management/Social Workerr. Management plans discussed with the patient, family and they are in agreement.  CODE STATUS: Full Code  TOTAL TIME TAKING CARE OF THIS PATIENT: 37 minutes.   POSSIBLE D/C IN 1-2 DAYS, DEPENDING ON CLINICAL CONDITION.   Enid Baas M.D on 01/18/2016 at 2:37 PM  Between 7am to 6pm - Pager - [phone]  After 6pm go to [email or web address] - password [email or web address] [facility]  [facility]  Office  [phone]  CC: Primary care physician; No PCP Per Patient

## 2016-01-19 LAB — BASIC METABOLIC PANEL
Anion gap: 9 (ref 5–15)
CHLORIDE: 106 mmol/L (ref 101–111)
CO2: 26 mmol/L (ref 22–32)
CREATININE: 0.62 mg/dL (ref 0.44–1.00)
Calcium: 8.9 mg/dL (ref 8.9–10.3)
GFR calc Af Amer: >60 mL/min (ref >60)
GFR calc non Af Amer: >60 mL/min (ref >60)
Glucose, Bld: 99 mg/dL (ref 65–99)
POTASSIUM: 4 mmol/L (ref 3.5–5.1)
SODIUM: 141 mmol/L (ref 135–145)

## 2016-01-19 MED ORDER — HYDROMORPHONE HCL 1 MG/ML IJ SOLN
2.0000 mg | Freq: Three times a day (TID) | INTRAMUSCULAR | Status: DC | PRN
  Administered 2016-01-19 – 2016-01-20 (×2): 2 mg via INTRAVENOUS
  Filled 2016-01-19 (×2): qty 2

## 2016-01-19 NOTE — Progress Notes (Signed)
[facility] - [place] at [facility]   PATIENT NAME: Lynn Larson    MR#:  [id number]  DATE OF BIRTH:  [birth year]  SUBJECTIVE:  CHIEF COMPLAINT:   Chief Complaint  Patient presents with  . Abdominal Pain   - Abdominal pain is much better. Complaints of ongoing nausea and vomiting. -On the other hand requesting for upgrading diet.  REVIEW OF SYSTEMS:  Review of Systems  Constitutional: Negative for chills, fever and malaise/fatigue.  HENT: Negative for ear discharge, ear pain and nosebleeds.   Respiratory: Negative for cough, shortness of breath and wheezing.   Cardiovascular: Negative for chest pain, palpitations and leg swelling.  Gastrointestinal: Positive for abdominal pain, nausea and vomiting. Negative for constipation and diarrhea.  Genitourinary: Negative for dysuria.  Neurological: Negative for dizziness, seizures and headaches.  Psychiatric/Behavioral: The patient is nervous/anxious.     DRUG ALLERGIES:   Allergies  Allergen Reactions  . Chantix [Varenicline]     Suicidal thoughts  . Morphine And Related Other (See Comments)    Reaction:  Redness of face     VITALS:  Blood pressure (!) 151/83, pulse (!) 53, temperature 98.2 F (36.8 C), temperature source Oral, resp. rate 18, height  (1.6 m), weight 72.1 kg (158 lb 14.4 oz), SpO2 95 %.  PHYSICAL EXAMINATION:  Physical Exam  GENERAL:  50 y.o.-year-old patient lying in the bed with no acute distress.  EYES: Pupils equal, round, reactive to light and accommodation. No scleral icterus. Extraocular muscles intact.  HEENT: Head atraumatic, normocephalic. Oropharynx and nasopharynx clear.  NECK:  Supple, no jugular venous distention. No thyroid enlargement, no tenderness.  LUNGS: Normal breath sounds bilaterally, no wheezing, rales,rhonchi or crepitation. No use of accessory muscles of respiration.  CARDIOVASCULAR: S1, S2 normal. No murmurs, rubs, or gallops.  ABDOMEN: Soft, diffuse  tenderness all over, voluntary guarding, nondistended. Bowel sounds present. No organomegaly or mass.  EXTREMITIES: No pedal edema, cyanosis, or clubbing.  NEUROLOGIC: Cranial nerves II through XII are intact. Muscle strength 5/5 in all extremities. Sensation intact. Gait not checked.  PSYCHIATRIC: The patient is alert and oriented x 3. Emotional SKIN: No obvious rash, lesion, or ulcer.    LABORATORY PANEL:   CBC  Recent Labs Lab 01/17/16 0329  WBC 13.8*  HGB 13.7  HCT 39.9  PLT 357   ------------------------------------------------------------------------------------------------------------------  Chemistries   Recent Labs Lab 01/16/16 1707  01/19/16 0344  NA 143  < > 141  K 3.3*  < > 4.0  CL 106  < > 106  CO2 26  < > 26  GLUCOSE 132*  < > 99  BUN 10  < > <5*  CREATININE 0.60  < > 0.62  CALCIUM 9.7  < > 8.9  AST 19  --   --   ALT 19  --   --   ALKPHOS 95  --   --   BILITOT 0.7  --   --   < > = values in this interval not displayed. ------------------------------------------------------------------------------------------------------------------  Cardiac Enzymes No results for input(s): TROPONINI in the last 168 hours. ------------------------------------------------------------------------------------------------------------------  RADIOLOGY:  No results found.  EKG:   Orders placed or performed during the hospital encounter of 12/28/15  . ED EKG  . ED EKG  . EKG 12-Lead  . EKG 12-Lead  . EKG 12-Lead  . EKG 12-Lead  . EKG    ASSESSMENT AND PLAN:   50y/o F with Past medical history significant for depression and anxiety, hypertension, asthma,  recurrent colitis presents to the hospital secondary to abdominal pain, nausea and vomiting.  #1 Acute Colitis- several admissions, ER visits due to colitis in the past. -Continue Cipro and Flagyl. Abdominal pain is improving. -Still complains of nauseous and vomiting. On clear liquid diet, but requesting to  upgrade diet. -Started on scheduled Zofran and Reglan. Continue Phenergan when necessary. -Continue to monitor for vomiting closely, if improving will start on soft diet later today. -Appreciate GI consult. Will need colonoscopy either as inpatient versus outpatient. -Bentyl added for abdominal cramping pain  #2 GERD-EGD in February 2017 showing severe esophagitis and multiple nonbleeding gastric ulcers. Noncompliant with medications as outpatient due to financial reasons. -Added Protonix and sucralfate.  #3 depression and anxiety-denies suicidal ideation. Refused to be started on any new medications here in the hospital.  #4 DVT prophylaxis-on Lovenox  #5 hypokalemia-improved.   Possible discharge in the next 1-2 days as symptoms improve and diet is advanced     All the records are reviewed and case discussed with Care Management/Social Workerr. Management plans discussed with the patient, family and they are in agreement.  CODE STATUS: Full Code  TOTAL TIME TAKING CARE OF THIS PATIENT: 34 minutes.   POSSIBLE D/C IN 1-2 DAYS, DEPENDING ON CLINICAL CONDITION.   Enid Baas M.D on 01/19/2016 at 10:48 AM  Between 7am to 6pm - Pager - [phone]  After 6pm go to [email or web address] - password [email or web address] [facility]  [place] [facility]  Office  [phone]  CC: Primary care physician; No PCP Per Patient

## 2016-01-19 NOTE — Progress Notes (Signed)
Patient complaints of being nausea. Controlled with scheduled medication. Pain controlled with PRN medication. Patient states that pain and nausea feeling begins when she awakes. No acute distress noted. Patient resting between care.

## 2016-01-20 LAB — COMPREHENSIVE METABOLIC PANEL
ALK PHOS: 89 U/L (ref 38–126)
ALT: 63 U/L — AB (ref 14–54)
AST: 48 U/L — AB (ref 15–41)
Albumin: 3.9 g/dL (ref 3.5–5.0)
Anion gap: 8 (ref 5–15)
BUN: 11 mg/dL (ref 6–20)
CHLORIDE: 105 mmol/L (ref 101–111)
CO2: 27 mmol/L (ref 22–32)
CREATININE: 0.83 mg/dL (ref 0.44–1.00)
Calcium: 9.2 mg/dL (ref 8.9–10.3)
GFR calc Af Amer: >60 mL/min (ref >60)
Glucose, Bld: 101 mg/dL — ABNORMAL HIGH (ref 65–99)
Potassium: 4 mmol/L (ref 3.5–5.1)
SODIUM: 140 mmol/L (ref 135–145)
Total Bilirubin: 0.8 mg/dL (ref 0.3–1.2)
Total Protein: 6.9 g/dL (ref 6.5–8.1)

## 2016-01-20 MED ORDER — METOCLOPRAMIDE HCL 5 MG PO TABS: 5.0000 mg | ORAL_TABLET | Freq: Three times a day (TID) | ORAL | 0 refills | Status: AC

## 2016-01-20 MED ORDER — METRONIDAZOLE 500 MG PO TABS: 500.0000 mg | ORAL_TABLET | Freq: Three times a day (TID) | ORAL | 0 refills | Status: DC

## 2016-01-20 MED ORDER — PANTOPRAZOLE SODIUM 40 MG PO TBEC: 40.0000 mg | DELAYED_RELEASE_TABLET | Freq: Two times a day (BID) | ORAL | 2 refills | Status: DC

## 2016-01-20 MED ORDER — SUCRALFATE 1 G PO TABS: 1.0000 g | ORAL_TABLET | Freq: Three times a day (TID) | ORAL | 2 refills | Status: DC

## 2016-01-20 MED ORDER — CIPROFLOXACIN HCL 500 MG PO TABS
500.0000 mg | ORAL_TABLET | Freq: Two times a day (BID) | ORAL | 0 refills | Status: DC
Start: 2016-01-20 — End: 2016-07-22

## 2016-01-20 MED ORDER — OXYCODONE-ACETAMINOPHEN 5-325 MG PO TABS: 1.0000 | ORAL_TABLET | Freq: Four times a day (QID) | ORAL | 0 refills | Status: DC | PRN

## 2016-01-20 MED ORDER — METRONIDAZOLE 500 MG PO TABS
500.0000 mg | ORAL_TABLET | Freq: Three times a day (TID) | ORAL | Status: DC
  Filled 2016-01-20: qty 1

## 2016-01-20 MED ORDER — PROMETHAZINE HCL 25 MG PO TABS: 25.0000 mg | ORAL_TABLET | Freq: Four times a day (QID) | ORAL | 0 refills | Status: DC | PRN

## 2016-01-20 MED ORDER — METOCLOPRAMIDE HCL 10 MG PO TABS
5.0000 mg | ORAL_TABLET | Freq: Three times a day (TID) | ORAL | Status: DC
  Filled 2016-01-20: qty 2

## 2016-01-20 MED ORDER — CIPROFLOXACIN HCL 500 MG PO TABS
500.0000 mg | ORAL_TABLET | Freq: Two times a day (BID) | ORAL | Status: DC
  Administered 2016-01-20: 500 mg via ORAL
  Filled 2016-01-20: qty 1

## 2016-01-20 MED ORDER — ONDANSETRON HCL 4 MG/2ML IJ SOLN: 4.0000 mg | Freq: Four times a day (QID) | INTRAMUSCULAR | Status: DC

## 2016-01-20 NOTE — Discharge Summary (Signed)
[facility] -  at [facility]   PATIENT NAME: Lynn Larson    MR#:  [id number]  DATE OF BIRTH:  05-Mar-1966  DATE OF ADMISSION:  01/16/2016 ADMITTING PHYSICIAN: Houston Siren, MD  DATE OF DISCHARGE: 01/20/2016  PRIMARY CARE PHYSICIAN: No PCP Per Patient    ADMISSION DIAGNOSIS:  Colitis [K52.9] Generalized abdominal pain [R10.84]  DISCHARGE DIAGNOSIS:  Active Problems:   Colitis   Generalized abdominal pain   SECONDARY DIAGNOSIS:   Past Medical History:  Diagnosis Date  . Asthma   . Hypertension     HOSPITAL COURSE:   50y/o F with Past medical history significant for depression and anxiety, hypertension, asthma, recurrent colitis presents to the hospital secondary to abdominal pain, nausea and vomiting.  #1 Acute Colitis- several admissions, ER visits due to colitis in the past. -Continue Cipro and Flagyl for 7 days. -Was complaining of nausea and vomiting but tolerating soft diet well. Feels better today, hasn't used any PRNs - Has chronic issues with pain and nausea- started Reglan- will need outpatient GI f/u for colonoscopy and also gastric emptying study - prior CT abd also showing colitis- will need colonoscopy as outpatient. -last EGD in Feb 2017- showing gastric ulcers- but has been non complaint with meds - Information about medication management clinic given. -Appreciate GI consult.   #2 GERD-EGD in February 2017 showing severe esophagitis and multiple nonbleeding gastric ulcers. Noncompliant with medications as outpatient due to financial reasons. -Added Protonix and sucralfate.  #3 depression and anxiety- more of a stressful situation, denies suicidal ideation. Refused to be started on any new medications here in the hospital.  Feels great today and being discharged home. High risk for re-admission  DISCHARGE CONDITIONS:   Stable  CONSULTS OBTAINED:  Treatment Team:  Enid Baas, MD [name], MD  DRUG  ALLERGIES:   Allergies  Allergen Reactions  . Chantix [Varenicline]     Suicidal thoughts  . Morphine And Related Other (See Comments)    Reaction:  Redness of face     DISCHARGE MEDICATIONS:   Current Discharge Medication List    START taking these medications   Details  ciprofloxacin (CIPRO) 500 MG tablet Take 1 tablet (500 mg total) by mouth 2 (two) times daily. X 4 more days Qty: 8 tablet, Refills: 0    metoCLOPramide (REGLAN) 5 MG tablet Take 1 tablet (5 mg total) by mouth 3 (three) times daily before meals. Qty: 90 tablet, Refills: 0    metroNIDAZOLE (FLAGYL) 500 MG tablet Take 1 tablet (500 mg total) by mouth every 8 (eight) hours. X 4 more days Qty: 12 tablet, Refills: 0    pantoprazole (PROTONIX) 40 MG tablet Take 1 tablet (40 mg total) by mouth 2 (two) times daily. Qty: 60 tablet, Refills: 2    promethazine (PHENERGAN) 25 MG tablet Take 1 tablet (25 mg total) by mouth every 6 (six) hours as needed for nausea or vomiting. Qty: 20 tablet, Refills: 0    sucralfate (CARAFATE) 1 g tablet Take 1 tablet (1 g total) by mouth 4 (four) times daily -  with meals and at bedtime. Qty: 120 tablet, Refills: 2      CONTINUE these medications which have CHANGED   Details  oxyCODONE-acetaminophen (ROXICET) 5-325 MG tablet Take 1 tablet by mouth every 6 (six) hours as needed. Qty: 20 tablet, Refills: 0      CONTINUE these medications which have NOT CHANGED   Details  alum & mag hydroxide-simeth (MAALOX/MYLANTA) 200-200-20  MG/5ML suspension Take 15-30 mLs by mouth 2 (two) times daily. 2 tablespoons in the morning and 1 tablespoon in the evening.      STOP taking these medications     acetaminophen (TYLENOL) 500 MG tablet      Calcium Carbonate Antacid (ALKA-SELTZER ANTACID PO)      dimenhyDRINATE (DRAMAMINE) 50 MG tablet          DISCHARGE INSTRUCTIONS:    1. PCP follow-up in 1-2 weeks   If you experience worsening of your admission symptoms, develop shortness of  breath, life threatening emergency, suicidal or homicidal thoughts you must seek medical attention immediately by calling 911 or calling your MD immediately  if symptoms less severe.  You Must read complete instructions/literature along with all the possible adverse reactions/side effects for all the Medicines you take and that have been prescribed to you. Take any new Medicines after you have completely understood and accept all the possible adverse reactions/side effects.   Please note  You were cared for by a hospitalist during your hospital stay. If you have any questions about your discharge medications or the care you received while you were in the hospital after you are discharged, you can call the unit and asked to speak with the hospitalist on call if the hospitalist that took care of you is not available. Once you are discharged, your primary care physician will handle any further medical issues. Please note that NO REFILLS for any discharge medications will be authorized once you are discharged, as it is imperative that you return to your primary care physician (or establish a relationship with a primary care physician if you do not have one) for your aftercare needs so that they can reassess your need for medications and monitor your lab values.    Today   CHIEF COMPLAINT:   Chief Complaint  Patient presents with  . Abdominal Pain     VITAL SIGNS:  Blood pressure (!) 157/77, pulse 64, temperature 98.5 F (36.9 C), temperature source Oral, resp. rate 18, height  (1.6 m), weight 72.1 kg (158 lb 14.4 oz), SpO2 96 %.  I/O:   Intake/Output Summary (Last 24 hours) at 01/20/16 0851 Last data filed at 01/19/16 1800  Gross per 24 hour  Intake              240 ml  Output                0 ml  Net              240 ml    PHYSICAL EXAMINATION:   Physical Exam  GENERAL:  50 y.o.-year-old patient lying in the bed with no acute distress.  EYES: Pupils equal, round, reactive to  light and accommodation. No scleral icterus. Extraocular muscles intact.  HEENT: Head atraumatic, normocephalic. Oropharynx and nasopharynx clear.  NECK:  Supple, no jugular venous distention. No thyroid enlargement, no tenderness.  LUNGS: Normal breath sounds bilaterally, no wheezing, rales,rhonchi or crepitation. No use of accessory muscles of respiration.  CARDIOVASCULAR: S1, S2 normal. No murmurs, rubs, or gallops.  ABDOMEN: Soft, non-tender, non-distended. Bowel sounds present. No organomegaly or mass.  EXTREMITIES: No pedal edema, cyanosis, or clubbing.  NEUROLOGIC: Cranial nerves II through XII are intact. Muscle strength 5/5 in all extremities. Sensation intact. Gait not checked.  PSYCHIATRIC: The patient is alert and oriented x 3.  SKIN: No obvious rash, lesion, or ulcer.   DATA REVIEW:   CBC  Recent Labs Lab  01/17/16 0329  WBC 13.8*  HGB 13.7  HCT 39.9  PLT 357    Chemistries   Recent Labs Lab 01/20/16 0356  NA 140  K 4.0  CL 105  CO2 27  GLUCOSE 101*  BUN 11  CREATININE 0.83  CALCIUM 9.2  AST 48*  ALT 63*  ALKPHOS 89  BILITOT 0.8    Cardiac Enzymes No results for input(s): TROPONINI in the last 168 hours.  Microbiology Results  Results for orders placed or performed during the hospital encounter of 01/16/16  C difficile quick scan w PCR reflex     Status: None   Collection Time: 01/16/16  3:40 AM  Result Value Ref Range Status   C Diff antigen NEGATIVE NEGATIVE Final   C Diff toxin NEGATIVE NEGATIVE Final   C Diff interpretation No C. difficile detected.  Final  Gastrointestinal Panel by PCR , Stool     Status: None   Collection Time: 01/16/16  8:17 PM  Result Value Ref Range Status   Campylobacter species NOT DETECTED NOT DETECTED Final   Plesimonas shigelloides NOT DETECTED NOT DETECTED Final   Salmonella species NOT DETECTED NOT DETECTED Final   Yersinia enterocolitica NOT DETECTED NOT DETECTED Final   Vibrio species NOT DETECTED NOT DETECTED  Final   Vibrio cholerae NOT DETECTED NOT DETECTED Final   Enteroaggregative E coli (EAEC) NOT DETECTED NOT DETECTED Final   Enteropathogenic E coli (EPEC) NOT DETECTED NOT DETECTED Final   Enterotoxigenic E coli (ETEC) NOT DETECTED NOT DETECTED Final   Shiga like toxin producing E coli (STEC) NOT DETECTED NOT DETECTED Final   E. coli O157 NOT DETECTED NOT DETECTED Final   Shigella/Enteroinvasive E coli (EIEC) NOT DETECTED NOT DETECTED Final   Cryptosporidium NOT DETECTED NOT DETECTED Final   Cyclospora cayetanensis NOT DETECTED NOT DETECTED Final   Entamoeba histolytica NOT DETECTED NOT DETECTED Final   Giardia lamblia NOT DETECTED NOT DETECTED Final   Adenovirus F40/41 NOT DETECTED NOT DETECTED Final   Astrovirus NOT DETECTED NOT DETECTED Final   Norovirus GI/GII NOT DETECTED NOT DETECTED Final   Rotavirus A NOT DETECTED NOT DETECTED Final   Sapovirus (I, II, IV, and V) NOT DETECTED NOT DETECTED Final    RADIOLOGY:  No results found.  EKG:   Orders placed or performed during the hospital encounter of 12/28/15  . ED EKG  . ED EKG  . EKG 12-Lead  . EKG 12-Lead  . EKG 12-Lead  . EKG 12-Lead  . EKG      Management plans discussed with the patient, family and they are in agreement.  CODE STATUS:     Code Status Orders        Start     Ordered   01/16/16 2134  Full code  Continuous     01/16/16 2133    Code Status History    Date Active Date Inactive Code Status Order ID Comments User Context   08/14/2015  5:33 PM 08/17/2015  2:44 PM Full Code [id number]  [name], MD Inpatient   02/15/2015  8:31 PM 02/18/2015 10:31 PM Full Code [id number]  Katha Hamming, MD ED      TOTAL TIME TAKING CARE OF THIS PATIENT: 37 minutes.    Asberry Lascola M.D on 01/20/2016 at 8:51 AM  Between 7am to 6pm - Pager - [phone]  After 6pm go to [email or web address] - password [email or web address] [facility]  [place] [facility]  Office  [phone]  CC: Primary care physician; No PCP Per  Patient

## 2016-01-20 NOTE — Care Management Note (Signed)
Case Management Note  Patient Details  Name: Lynn Larson MRN: [id number] Date of Birth: [birth year]  Subjective/Objective:   Weekend case manager for 1-A scheduled an appointment for Lynn Larson at [facility] on Tuesday 01/22/16 at 2pm. Also, a MATCH program coupon was given to Lynn Larson so that she can obtain medications for $3.00 each from any pharmacy on the list attached to the [facility] coupon. Discharge order to home today.                  Action/Plan:   Expected Discharge Date:  01/18/16               Expected Discharge Plan:  Home/Self Care  In-House Referral:     Discharge planning Services  CM Consult, Medication Assistance, Indigent Health Clinic  Post Acute Care Choice:    Choice offered to:     DME Arranged:    DME Agency:     HH Arranged:    HH Agency:     Status of Service:  In process, will continue to follow  If discussed at Long Length of Stay Meetings, dates discussed:    Additional Comments:  Lynn Larson A, RN 01/20/2016, 8:58 AM

## 2016-01-20 NOTE — Progress Notes (Signed)
Discharge summary reviewed with patient with verbal understanding. Rx given to patient upon discharge, belongings packed. VSS at this time.

## 2016-07-21 ENCOUNTER — Encounter: Payer: Self-pay | Admitting: *Deleted

## 2016-07-21 ENCOUNTER — Emergency Department
Admission: EM | Admit: 2016-07-21 | Discharge: 2016-07-22 | Disposition: A | Discharge status: Home / Self Care | Payer: Self-pay | Attending: Emergency Medicine | Admitting: Emergency Medicine

## 2016-07-21 ENCOUNTER — Emergency Department: Payer: Self-pay

## 2016-07-21 DIAGNOSIS — Z79899 Other long term (current) drug therapy: Secondary | ICD-10-CM | POA: Insufficient documentation

## 2016-07-21 DIAGNOSIS — R112 Nausea with vomiting, unspecified: Secondary | ICD-10-CM

## 2016-07-21 DIAGNOSIS — R110 Nausea: Secondary | ICD-10-CM

## 2016-07-21 DIAGNOSIS — J45909 Unspecified asthma, uncomplicated: Secondary | ICD-10-CM | POA: Insufficient documentation

## 2016-07-21 DIAGNOSIS — I10 Essential (primary) hypertension: Secondary | ICD-10-CM | POA: Insufficient documentation

## 2016-07-21 DIAGNOSIS — F17210 Nicotine dependence, cigarettes, uncomplicated: Secondary | ICD-10-CM | POA: Insufficient documentation

## 2016-07-21 DIAGNOSIS — R1010 Upper abdominal pain, unspecified: Secondary | ICD-10-CM

## 2016-07-21 LAB — COMPREHENSIVE METABOLIC PANEL
ALT: 16 U/L (ref 14–54)
ANION GAP: 8 (ref 5–15)
AST: 21 U/L (ref 15–41)
Albumin: 4.7 g/dL (ref 3.5–5.0)
Alkaline Phosphatase: 86 U/L (ref 38–126)
BUN: 15 mg/dL (ref 6–20)
CO2: 27 mmol/L (ref 22–32)
Calcium: 9.3 mg/dL (ref 8.9–10.3)
Chloride: 105 mmol/L (ref 101–111)
Creatinine, Ser: 0.77 mg/dL (ref 0.44–1.00)
GFR calc Af Amer: >60 mL/min (ref >60)
GFR calc non Af Amer: >60 mL/min (ref >60)
GLUCOSE: 120 mg/dL — AB (ref 65–99)
POTASSIUM: 3.9 mmol/L (ref 3.5–5.1)
SODIUM: 140 mmol/L (ref 135–145)
Total Bilirubin: 0.8 mg/dL (ref 0.3–1.2)
Total Protein: 7.9 g/dL (ref 6.5–8.1)

## 2016-07-21 LAB — CBC WITH DIFFERENTIAL/PLATELET
BASOS PCT: 0 %
Basophils Absolute: 0.1 10*3/uL (ref 0–0.1)
Eosinophils Absolute: 0.1 10*3/uL (ref 0–0.7)
Eosinophils Relative: 1 %
HEMATOCRIT: 43.3 % (ref 35.0–47.0)
HEMOGLOBIN: 14.6 g/dL (ref 12.0–16.0)
LYMPHS ABS: 1.9 10*3/uL (ref 1.0–3.6)
Lymphocytes Relative: 10 %
MCH: 28.9 pg (ref 26.0–34.0)
MCHC: 33.8 g/dL (ref 32.0–36.0)
MCV: 85.5 fL (ref 80.0–100.0)
MONOS PCT: 4 %
Monocytes Absolute: 0.7 10*3/uL (ref 0.2–0.9)
NEUTROS ABS: 15.8 10*3/uL — AB (ref 1.4–6.5)
NEUTROS PCT: 85 %
Platelets: 370 10*3/uL (ref 150–440)
RBC: 5.06 MIL/uL (ref 3.80–5.20)
RDW: 14.5 % (ref 11.5–14.5)
WBC: 18.7 10*3/uL — ABNORMAL HIGH (ref 3.6–11.0)

## 2016-07-21 LAB — TROPONIN I: Troponin I: <0.03 ng/mL (ref <0.03)

## 2016-07-21 LAB — LIPASE, BLOOD: Lipase: 19 U/L (ref 11–51)

## 2016-07-21 MED ORDER — PROMETHAZINE HCL 25 MG/ML IJ SOLN
12.5000 mg | Freq: Once | INTRAMUSCULAR | Status: AC
  Administered 2016-07-21: 12.5 mg via INTRAMUSCULAR
  Filled 2016-07-21: qty 1

## 2016-07-21 MED ORDER — IOPAMIDOL (ISOVUE-300) INJECTION 61%
100.0000 mL | Freq: Once | INTRAVENOUS | Status: AC | PRN
  Administered 2016-07-21: 100 mL via INTRAVENOUS

## 2016-07-21 MED ORDER — HYDROMORPHONE HCL 1 MG/ML IJ SOLN
0.5000 mg | Freq: Once | INTRAMUSCULAR | Status: AC
  Administered 2016-07-21: 0.5 mg via INTRAVENOUS
  Filled 2016-07-21: qty 1

## 2016-07-21 MED ORDER — IOPAMIDOL (ISOVUE-300) INJECTION 61%: 30.0000 mL | Freq: Once | INTRAVENOUS | Status: DC | PRN

## 2016-07-21 MED ORDER — ONDANSETRON HCL 4 MG/2ML IJ SOLN
4.0000 mg | Freq: Once | INTRAMUSCULAR | Status: AC
Start: 2016-07-21 — End: 2016-07-21
  Administered 2016-07-21: 4 mg via INTRAVENOUS
  Filled 2016-07-21: qty 2

## 2016-07-21 MED ORDER — PANTOPRAZOLE SODIUM 40 MG IV SOLR
40.0000 mg | Freq: Once | INTRAVENOUS | Status: AC
  Administered 2016-07-21: 40 mg via INTRAVENOUS
  Filled 2016-07-21: qty 40

## 2016-07-21 MED ORDER — SODIUM CHLORIDE 0.9 % IV BOLUS (SEPSIS)
1000.0000 mL | Freq: Once | INTRAVENOUS | Status: AC
  Administered 2016-07-21: 1000 mL via INTRAVENOUS

## 2016-07-21 MED ORDER — PROMETHAZINE HCL 25 MG/ML IJ SOLN
12.5000 mg | Freq: Once | INTRAMUSCULAR | Status: AC
  Administered 2016-07-21: 12.5 mg via INTRAVENOUS

## 2016-07-21 MED ORDER — PROMETHAZINE HCL 25 MG/ML IJ SOLN
12.5000 mg | Freq: Once | INTRAMUSCULAR | Status: AC
  Administered 2016-07-21: 12.5 mg via INTRAVENOUS
  Filled 2016-07-21: qty 1

## 2016-07-21 MED ORDER — HYDROMORPHONE HCL 1 MG/ML IJ SOLN
0.5000 mg | INTRAMUSCULAR | Status: AC
  Administered 2016-07-21: 0.5 mg via INTRAVENOUS
  Filled 2016-07-21: qty 1

## 2016-07-21 NOTE — ED Notes (Signed)
Pt asks to see doctor to tel him she feels fine and is ready to go.

## 2016-07-21 NOTE — ED Triage Notes (Signed)
Pt arrived to ED via EMS reporting a "sphvincter muscle that is not closing in my stomach" Pt reports this has happened in the past. Pt has reportedly been vomiting for the past three hurs since occurrence. Pt reports severe pain.   EMS reports giving pt 100 fentanyl and 4mg  zofran.

## 2016-07-21 NOTE — ED Notes (Signed)
EMS IV blew in CT. Other IV placement is reason for treatment delay.

## 2016-07-21 NOTE — ED Provider Notes (Signed)
-----------------------------------------   10:12 PM on 07/21/2016 -----------------------------------------  Patient reports unremitting nausea, 7 of 10 epigastric discomfort. She remains quite tender in the upper epigastrium. Her CT scan is unremarkable for acute pathology. I reviewed her EKG as well which does not demonstrate any frank ischemic change and she complains of no chest complaint  She is awake and alert, does appear in moderate discomfort and reports she cannot take anything by mouth because of the severe nausea. I will give her additional Zofran IV, a dose of Phenergan intramuscularly as well as a repeat dose of Dilaudid and reassess thereafter. If this has not improved her symptoms the point she is able take by mouth well, but I suspect she may need admission to the hospital for intractable nausea and abdominal pain  In addition I have added on troponin the patient's lab work.  ----------------------------------------- 11:07 PM on 07/21/2016 -----------------------------------------  Ongoing care assigned to Dr. Manson PasseyBrown. Reassess patient for intractability of her nausea and vomiting after receiving additional Zofran and Dilaudid. The patient unable tolerate by mouth, or intractable pain/nausea remain patient likely need to be admitted, however if significant improvement I think the patient likely be safe for discharge with a reassuring CT, negative troponin, if her symptoms have improved.   Sharyn CreamerMark Xitlali Kastens, MD 07/21/16 2308

## 2016-07-21 NOTE — ED Notes (Signed)
No relief from nausea and pain per pt.

## 2016-07-21 NOTE — ED Notes (Signed)
Received report at 2335

## 2016-07-21 NOTE — ED Notes (Signed)
Pt reports having had a similar incident 6 months ago and reports feeling as though "my intestines are moving around inside of me. Pt after two doses of nausea medication is still vomiting. Per MD verbal order CT was called to proceed with CT scan without oral contrast.

## 2016-07-21 NOTE — ED Provider Notes (Addendum)
[facility] Emergency Department Provider Note   ____________________________________________   First MD Initiated Contact with Patient 07/21/16 1943     (approximate)  I have reviewed the triage vital signs and the nursing notes.   HISTORY  Chief Complaint No chief complaint on file.   HPI Lynn GasterJudy Larson is a 51 y.o. female who comes in by EMS for "sphincter muscle in my stomach wall close" patient says she's had this before. Review of her old records so that she's had multiple ulcers in her stomach with hematemesis. He is also had colitis in the past. Patient says it feels like a knife is stabbing her in the upper abdomen. She has a very large vertical scar which she says is from a cholecystectomy when she was 51 years old. Patient received 100 of fentanyl and 4 Zofran IV from EMS. She says she still in a lot of pain. Pain is severe and stabbing.   Past Medical History:  Diagnosis Date  . Asthma   . Hypertension     Patient Active Problem List   Diagnosis Date Noted  . Generalized abdominal pain   . Colitis 01/16/2016  . Hematemesis 08/14/2015  . Nausea & vomiting 02/15/2015    Past Surgical History:  Procedure Laterality Date  . CHOLECYSTECTOMY    . ESOPHAGOGASTRODUODENOSCOPY (EGD) WITH PROPOFOL [name] 08/15/2015   Procedure: ESOPHAGOGASTRODUODENOSCOPY (EGD) WITH PROPOFOL;  Surgeon: Christena DeemMartin U Skulskie, MD;  [facility];  Service: Endoscopy;  Laterality: [name];    Prior to Admission medications   Medication Sig Start Date End Date Taking? Authorizing Provider  alum & mag hydroxide-simeth (MAALOX/MYLANTA) 200-200-20 MG/5ML suspension Take 15-30 mLs by mouth 2 (two) times daily. 2 tablespoons in the morning and 1 tablespoon in the evening.    Historical Provider, MD  ciprofloxacin (CIPRO) 500 MG tablet Take 1 tablet (500 mg total) by mouth 2 (two) times daily. X 4 more days 01/20/16   Enid Baasadhika Kalisetti, MD  metoCLOPramide (REGLAN) 5 MG tablet  Take 1 tablet (5 mg total) by mouth 3 (three) times daily before meals. 01/20/16   Enid Baasadhika Kalisetti, MD  metroNIDAZOLE (FLAGYL) 500 MG tablet Take 1 tablet (500 mg total) by mouth every 8 (eight) hours. X 4 more days 01/20/16   Enid Baasadhika Kalisetti, MD  oxyCODONE-acetaminophen (ROXICET) 5-325 MG tablet Take 1 tablet by mouth every 6 (six) hours as needed. 01/20/16   Enid Baasadhika Kalisetti, MD  pantoprazole (PROTONIX) 40 MG tablet Take 1 tablet (40 mg total) by mouth 2 (two) times daily. 01/20/16   Enid Baasadhika Kalisetti, MD  promethazine (PHENERGAN) 25 MG tablet Take 1 tablet (25 mg total) by mouth every 6 (six) hours as needed for nausea or vomiting. 01/20/16   Enid Baasadhika Kalisetti, MD  sucralfate (CARAFATE) 1 g tablet Take 1 tablet (1 g total) by mouth 4 (four) times daily -  with meals and at bedtime. 01/20/16   Enid Baasadhika Kalisetti, MD    Allergies Chantix [varenicline] and Morphine and related  Family History  Problem Relation Age of Onset  . Breast cancer Maternal Grandmother   . Ovarian cancer Maternal Grandmother     Social History Social History  Substance Use Topics  . Smoking status: Current Every Day Smoker    Packs/day: 0.50    Years: 30.00    Types: Cigarettes  . Smokeless tobacco: Never Used  . Alcohol use No    Review of Systems Constitutional: No fever/chills Eyes: No visual changes. ENT: No sore throat. Cardiovascular: Denies chest pain. Respiratory:  Denies shortness of breath. Gastrointestinal:See history of present illness Genitourinary: Negative for dysuria. Musculoskeletal: Negative for back pain. Skin: Negative for rash.  10-point ROS otherwise negative.  ____________________________________________   PHYSICAL EXAM:  VITAL SIGNS: ED Triage Vitals  Enc Vitals Group     BP      Pulse      Resp      Temp      Temp src      SpO2      Weight      Height      Head Circumference      Peak Flow      Pain Score      Pain Loc      Pain Edu?      Excl. in GC?      Constitutional: Alert and oriented. Appears ill and in pain Eyes: Conjunctivae are normal. PERRL. EOMI. Head: Atraumatic. Nose: No congestion/rhinnorhea. Mouth/Throat: Mucous membranes are moist.  Oropharynx non-erythematous. Neck: No stridor.  Cardiovascular: Normal rate, regular rhythm. Grossly normal heart sounds.  Good peripheral circulation. Respiratory: Normal respiratory effort.  No retractions. Lungs CTAB. Gastrointestinal: Soft tender diffusely especially in the upper abdomen. Decreased bowel sounds No distention. No abdominal bruits. No CVA tenderness. Musculoskeletal: No lower extremity tenderness nor edema.  No joint effusions.   ____________________________________________   LABS (all labs ordered are listed, but only abnormal results are displayed)  Labs Reviewed  CBC WITH DIFFERENTIAL/PLATELET - Abnormal; Notable for the following:       Result Value   WBC 18.7 (*)    Neutro Abs 15.8 (*)    All other components within normal limits  COMPREHENSIVE METABOLIC PANEL  LIPASE, BLOOD  URINALYSIS, COMPLETE (UACMP) WITH MICROSCOPIC  PREGNANCY, URINE   ____________________________________________  EKG  EKG read and interpreted by me shows normal sinus rhythm rate of 96 normal axis computer is reading slight ST segment depression inferiorly I do not see it however there is also a short PR interval otherwise EKG looks okay ____________________________________________  RADIOLOGY   ____________________________________________   PROCEDURES  Procedure(s) performed:   Procedures  Critical Care performed:   ____________________________________________   INITIAL IMPRESSION / ASSESSMENT AND PLAN / ED COURSE  Pertinent labs & imaging results that were available during my care of the patient were reviewed by me and considered in my medical decision making (see chart for details).  Patient is awaiting CT scan. Anticipate will admit the patient as she has had  this problem before. I want to get the CT scan there to make sure she does not have a bowel obstruction as she has had previous abdominal surgery. She has gotten the Protonix IV. I will sign the patient out to Dr. Sharion Settler      ____________________________________________   FINAL CLINICAL IMPRESSION(S) / ED DIAGNOSES  Final diagnoses:  Pain of upper abdomen  Intractable vomiting with nausea, unspecified vomiting type      NEW MEDICATIONS STARTED DURING THIS VISIT:  New Prescriptions   No medications on file     Note:  This document was prepared using Dragon voice recognition software and may include unintentional dictation errors.    Arnaldo Natal, MD 07/21/16 2059    Arnaldo Natal, MD 07/21/16 2101

## 2016-07-22 ENCOUNTER — Observation Stay
Admission: EM | Admit: 2016-07-22 | Discharge: 2016-07-24 | Disposition: A | Discharge status: Home / Self Care | Payer: Self-pay | Attending: Internal Medicine | Admitting: Internal Medicine

## 2016-07-22 ENCOUNTER — Emergency Department: Payer: Self-pay

## 2016-07-22 DIAGNOSIS — R1013 Epigastric pain: Secondary | ICD-10-CM

## 2016-07-22 DIAGNOSIS — K529 Noninfective gastroenteritis and colitis, unspecified: Principal | ICD-10-CM | POA: Insufficient documentation

## 2016-07-22 DIAGNOSIS — F17210 Nicotine dependence, cigarettes, uncomplicated: Secondary | ICD-10-CM | POA: Insufficient documentation

## 2016-07-22 DIAGNOSIS — E860 Dehydration: Secondary | ICD-10-CM | POA: Insufficient documentation

## 2016-07-22 DIAGNOSIS — R197 Diarrhea, unspecified: Secondary | ICD-10-CM

## 2016-07-22 DIAGNOSIS — J45909 Unspecified asthma, uncomplicated: Secondary | ICD-10-CM | POA: Insufficient documentation

## 2016-07-22 DIAGNOSIS — R112 Nausea with vomiting, unspecified: Secondary | ICD-10-CM

## 2016-07-22 DIAGNOSIS — Z888 Allergy status to other drugs, medicaments and biological substances status: Secondary | ICD-10-CM | POA: Insufficient documentation

## 2016-07-22 DIAGNOSIS — I10 Essential (primary) hypertension: Secondary | ICD-10-CM | POA: Insufficient documentation

## 2016-07-22 DIAGNOSIS — Z79899 Other long term (current) drug therapy: Secondary | ICD-10-CM | POA: Insufficient documentation

## 2016-07-22 DIAGNOSIS — Z885 Allergy status to narcotic agent status: Secondary | ICD-10-CM | POA: Insufficient documentation

## 2016-07-22 LAB — HEPATIC FUNCTION PANEL
ALBUMIN: 4.6 g/dL (ref 3.5–5.0)
ALK PHOS: 92 U/L (ref 38–126)
ALT: 27 U/L (ref 14–54)
AST: 35 U/L (ref 15–41)
Bilirubin, Direct: 0.1 mg/dL (ref 0.1–0.5)
Indirect Bilirubin: 1.1 mg/dL — ABNORMAL HIGH (ref 0.3–0.9)
TOTAL PROTEIN: 8 g/dL (ref 6.5–8.1)
Total Bilirubin: 1.2 mg/dL (ref 0.3–1.2)

## 2016-07-22 LAB — CBC
HEMATOCRIT: 43.6 % (ref 35.0–47.0)
Hemoglobin: 15.5 g/dL (ref 12.0–16.0)
MCH: 30.1 pg (ref 26.0–34.0)
MCHC: 35.6 g/dL (ref 32.0–36.0)
MCV: 84.7 fL (ref 80.0–100.0)
Platelets: 380 10*3/uL (ref 150–440)
RBC: 5.15 MIL/uL (ref 3.80–5.20)
RDW: 14.3 % (ref 11.5–14.5)
WBC: 13.3 10*3/uL — AB (ref 3.6–11.0)

## 2016-07-22 LAB — BASIC METABOLIC PANEL
ANION GAP: 8 (ref 5–15)
BUN: 16 mg/dL (ref 6–20)
CALCIUM: 9.6 mg/dL (ref 8.9–10.3)
CO2: 27 mmol/L (ref 22–32)
Chloride: 104 mmol/L (ref 101–111)
Creatinine, Ser: 0.62 mg/dL (ref 0.44–1.00)
GFR calc Af Amer: >60 mL/min (ref >60)
Glucose, Bld: 118 mg/dL — ABNORMAL HIGH (ref 65–99)
POTASSIUM: 3.5 mmol/L (ref 3.5–5.1)
SODIUM: 139 mmol/L (ref 135–145)

## 2016-07-22 LAB — TROPONIN I

## 2016-07-22 LAB — LIPASE, BLOOD: LIPASE: 25 U/L (ref 11–51)

## 2016-07-22 MED ORDER — METOCLOPRAMIDE HCL 5 MG/ML IJ SOLN
10.0000 mg | Freq: Once | INTRAMUSCULAR | Status: AC
  Administered 2016-07-22: 10 mg via INTRAVENOUS
  Filled 2016-07-22: qty 2

## 2016-07-22 MED ORDER — NICOTINE 7 MG/24HR TD PT24
7.0000 mg | MEDICATED_PATCH | Freq: Every day | TRANSDERMAL | Status: DC
  Administered 2016-07-23 – 2016-07-24 (×2): 7 mg via TRANSDERMAL
  Filled 2016-07-22 (×2): qty 1

## 2016-07-22 MED ORDER — ENOXAPARIN SODIUM 40 MG/0.4ML ~~LOC~~ SOLN
40.0000 mg | Freq: Every day | SUBCUTANEOUS | Status: DC
  Administered 2016-07-23 (×2): 40 mg via SUBCUTANEOUS
  Filled 2016-07-22 (×2): qty 0.4

## 2016-07-22 MED ORDER — HYDROMORPHONE HCL 1 MG/ML IJ SOLN
1.0000 mg | Freq: Once | INTRAMUSCULAR | Status: AC
  Administered 2016-07-22: 1 mg via INTRAVENOUS
  Filled 2016-07-22: qty 1

## 2016-07-22 MED ORDER — ONDANSETRON HCL 4 MG/2ML IJ SOLN: 4.0000 mg | Freq: Once | INTRAMUSCULAR | Status: DC

## 2016-07-22 MED ORDER — METOCLOPRAMIDE HCL 5 MG PO TABS
5.0000 mg | ORAL_TABLET | Freq: Three times a day (TID) | ORAL | Status: DC
  Administered 2016-07-23 – 2016-07-24 (×5): 5 mg via ORAL
  Filled 2016-07-22 (×5): qty 1

## 2016-07-22 MED ORDER — PROCHLORPERAZINE EDISYLATE 5 MG/ML IJ SOLN
5.0000 mg | INTRAMUSCULAR | Status: DC | PRN
  Administered 2016-07-23 – 2016-07-24 (×3): 5 mg via INTRAVENOUS
  Filled 2016-07-22 (×3): qty 2

## 2016-07-22 MED ORDER — ACETAMINOPHEN 325 MG PO TABS: 650.0000 mg | ORAL_TABLET | Freq: Four times a day (QID) | ORAL | Status: DC | PRN

## 2016-07-22 MED ORDER — SODIUM CHLORIDE 0.9 % IV BOLUS (SEPSIS)
1000.0000 mL | Freq: Once | INTRAVENOUS | Status: AC
  Administered 2016-07-22: 1000 mL via INTRAVENOUS

## 2016-07-22 MED ORDER — ONDANSETRON HCL 4 MG/2ML IJ SOLN
4.0000 mg | Freq: Four times a day (QID) | INTRAMUSCULAR | Status: DC | PRN
  Administered 2016-07-23 – 2016-07-24 (×4): 4 mg via INTRAVENOUS
  Filled 2016-07-22 (×4): qty 2

## 2016-07-22 MED ORDER — ACETAMINOPHEN 650 MG RE SUPP: 650.0000 mg | Freq: Four times a day (QID) | RECTAL | Status: DC | PRN

## 2016-07-22 MED ORDER — SUCRALFATE 1 G PO TABS
1.0000 g | ORAL_TABLET | Freq: Two times a day (BID) | ORAL | Status: DC
  Administered 2016-07-23 – 2016-07-24 (×4): 1 g via ORAL
  Filled 2016-07-22 (×4): qty 1

## 2016-07-22 MED ORDER — HYDROMORPHONE HCL 1 MG/ML IJ SOLN
1.0000 mg | INTRAMUSCULAR | Status: DC | PRN
  Administered 2016-07-23 – 2016-07-24 (×8): 1 mg via INTRAVENOUS
  Filled 2016-07-22 (×8): qty 1

## 2016-07-22 MED ORDER — PANTOPRAZOLE SODIUM 40 MG PO TBEC
40.0000 mg | DELAYED_RELEASE_TABLET | Freq: Two times a day (BID) | ORAL | Status: DC
  Administered 2016-07-23 – 2016-07-24 (×4): 40 mg via ORAL
  Filled 2016-07-22 (×4): qty 1

## 2016-07-22 MED ORDER — OXYCODONE HCL 5 MG PO TABS
5.0000 mg | ORAL_TABLET | ORAL | Status: DC | PRN
  Administered 2016-07-23: 5 mg via ORAL
  Filled 2016-07-22: qty 1

## 2016-07-22 MED ORDER — HYDROMORPHONE HCL 1 MG/ML IJ SOLN
0.5000 mg | Freq: Once | INTRAMUSCULAR | Status: AC
  Administered 2016-07-22: 0.5 mg via INTRAVENOUS
  Filled 2016-07-22: qty 1

## 2016-07-22 MED ORDER — ONDANSETRON HCL 4 MG PO TABS: 4.0000 mg | ORAL_TABLET | Freq: Every day | ORAL | 0 refills | Status: DC | PRN

## 2016-07-22 MED ORDER — ONDANSETRON HCL 4 MG PO TABS: 4.0000 mg | ORAL_TABLET | Freq: Four times a day (QID) | ORAL | Status: DC | PRN

## 2016-07-22 MED ORDER — FAMOTIDINE IN NACL 20-0.9 MG/50ML-% IV SOLN
20.0000 mg | Freq: Once | INTRAVENOUS | Status: AC
  Administered 2016-07-22: 20 mg via INTRAVENOUS
  Filled 2016-07-22: qty 50

## 2016-07-22 MED ORDER — SODIUM CHLORIDE 0.9 % IV SOLN
INTRAVENOUS | Status: AC
  Administered 2016-07-23: 01:00:00 via INTRAVENOUS

## 2016-07-22 MED ORDER — GI COCKTAIL ~~LOC~~
30.0000 mL | Freq: Once | ORAL | Status: AC
  Administered 2016-07-22: 30 mL via ORAL
  Filled 2016-07-22: qty 30

## 2016-07-22 MED ORDER — PROMETHAZINE HCL 25 MG PO TABS: 25.0000 mg | ORAL_TABLET | Freq: Four times a day (QID) | ORAL | 0 refills | Status: DC | PRN

## 2016-07-22 NOTE — ED Notes (Signed)
2 unsuccessful PIV attempts by this RN (right ac and right upper arm).

## 2016-07-22 NOTE — ED Provider Notes (Signed)
[facility] Emergency Department Provider Note  ____________________________________________  Time seen: Approximately 7[facility]  I have reviewed the triage vital signs and the nursing notes.   HISTORY  Chief Complaint Nausea; Emesis; and Chest Pain   HPI Lynn GasterJudy Larson is a 51 y.o. female h/o erosive gastritis and PUD seen here yesterday for severe abdominal pain, N/V who returns today for same complaints. Patient reports that yesterday she felt better but this morning at 11 AM she started feeling nausea. She took Zofran but that didn't help. She started vomiting again and having diarrhea. She reports that her epigastric pain return. She is currently endorsing 10 out of 10 sharp stabbing epigastric abdominal pain that is identical to the one she had yesterday, constant and non radiating. She had a CT scan of her abdomen and pelvis done yesterday with no acute findings. Patient denies NSAID use. She also endorses a frontal HA since she started vomiting today. Patient denies fever, chills, chest pain, shortness of breath.  Past Medical History:  Diagnosis Date  . Asthma   . Hypertension     Patient Active Problem List   Diagnosis Date Noted  . Generalized abdominal pain   . Colitis 01/16/2016  . Hematemesis 08/14/2015  . Nausea & vomiting 02/15/2015    Past Surgical History:  Procedure Laterality Date  . CHOLECYSTECTOMY    . ESOPHAGOGASTRODUODENOSCOPY (EGD) WITH PROPOFOL [name] 08/15/2015   Procedure: ESOPHAGOGASTRODUODENOSCOPY (EGD) WITH PROPOFOL;  Surgeon: Christena DeemMartin U Skulskie, MD;  [facility] ENDOSCOPY;  Service: Endoscopy;  Laterality: [name];    Prior to Admission medications   Medication Sig Start Date End Date Taking? Authorizing Provider  alum & mag hydroxide-simeth (MAALOX/MYLANTA) 200-200-20 MG/5ML suspension Take 15-30 mLs by mouth 2 (two) times daily. 2 tablespoons in the morning and 1 tablespoon in the evening.    Historical Provider, MD    ciprofloxacin (CIPRO) 500 MG tablet Take 1 tablet (500 mg total) by mouth 2 (two) times daily. X 4 more days 01/20/16   Enid Baasadhika Kalisetti, MD  metoCLOPramide (REGLAN) 5 MG tablet Take 1 tablet (5 mg total) by mouth 3 (three) times daily before meals. 01/20/16   Enid Baasadhika Kalisetti, MD  metroNIDAZOLE (FLAGYL) 500 MG tablet Take 1 tablet (500 mg total) by mouth every 8 (eight) hours. X 4 more days 01/20/16   Enid Baasadhika Kalisetti, MD  ondansetron (ZOFRAN) 4 MG tablet Take 1 tablet (4 mg total) by mouth daily as needed for nausea or vomiting. 07/22/16 07/22/17  Darci Currentandolph N Brown, MD  oxyCODONE-acetaminophen (ROXICET) 5-325 MG tablet Take 1 tablet by mouth every 6 (six) hours as needed. 01/20/16   Enid Baasadhika Kalisetti, MD  pantoprazole (PROTONIX) 40 MG tablet Take 1 tablet (40 mg total) by mouth 2 (two) times daily. 01/20/16   Enid Baasadhika Kalisetti, MD  promethazine (PHENERGAN) 25 MG tablet Take 1 tablet (25 mg total) by mouth every 6 (six) hours as needed for nausea or vomiting. 01/20/16   Enid Baasadhika Kalisetti, MD  promethazine (PHENERGAN) 25 MG tablet Take 1 tablet (25 mg total) by mouth every 6 (six) hours as needed for nausea or vomiting. 07/22/16   Darci Currentandolph N Brown, MD  sucralfate (CARAFATE) 1 g tablet Take 1 tablet (1 g total) by mouth 4 (four) times daily -  with meals and at bedtime. 01/20/16   Enid Baasadhika Kalisetti, MD    Allergies Chantix [varenicline] and Morphine and related  Family History  Problem Relation Age of Onset  . Breast cancer Maternal Grandmother   . Ovarian cancer  Maternal Grandmother     Social History Social History  Substance Use Topics  . Smoking status: Current Every Day Smoker    Packs/day: 0.50    Years: 30.00    Types: Cigarettes  . Smokeless tobacco: Never Used  . Alcohol use No    Review of Systems  Constitutional: Negative for fever. Eyes: Negative for visual changes. ENT: Negative for sore throat. Neck: No neck pain  Cardiovascular: Negative for chest pain. Respiratory:  Negative for shortness of breath. Gastrointestinal: + epigastric abdominal pain, vomiting and diarrhea. Genitourinary: Negative for dysuria. Musculoskeletal: Negative for back pain. Skin: Negative for rash. Neurological: Negative for headaches, weakness or numbness. Psych: No SI or HI  ____________________________________________   PHYSICAL EXAM:  VITAL SIGNS: ED Triage Vitals [07/22/16 1431]  Enc Vitals Group     BP (!) 142/117     Pulse Rate (!) 103     Resp 20     Temp 98.7 F (37.1 C)     Temp Source Oral     SpO2 97 %     Weight 160 lb (72.6 kg)     Height 5\' 3"  (1.6 m)     Head Circumference      Peak Flow      Pain Score 10     Pain Loc      Pain Edu?      Excl. in GC?     Constitutional: Alert and oriented, crying in pain.  HEENT:      Head: Normocephalic and atraumatic.         Eyes: Conjunctivae are normal. Sclera is non-icteric. EOMI. PERRL      Mouth/Throat: Mucous membranes are moist.       Neck: Supple with no signs of meningismus. Cardiovascular: Tachycardic with regular rate and rhythm. No murmurs, gallops, or rubs. 2+ symmetrical distal pulses are present in all extremities. No JVD. Respiratory: Normal respiratory effort. Lungs are clear to auscultation bilaterally. No wheezes, crackles, or rhonchi.  Gastrointestinal: Soft, ttp over the epigastrium, non distended with positive bowel sounds. No rebound or guarding. Genitourinary: No CVA tenderness. Musculoskeletal: Nontender with normal range of motion in all extremities. No edema, cyanosis, or erythema of extremities. Neurologic: Normal speech and language. Face is symmetric. Moving all extremities. No gross focal neurologic deficits are appreciated. Skin: Skin is warm, dry and intact. No rash noted. Psychiatric: Mood and affect are normal. Speech and behavior are normal.  ____________________________________________   LABS (all labs ordered are listed, but only abnormal results are  displayed)  Labs Reviewed  BASIC METABOLIC PANEL - Abnormal; Notable for the following:       Result Value   Glucose, Bld 118 (*)    All other components within normal limits  CBC - Abnormal; Notable for the following:    WBC 13.3 (*)    All other components within normal limits  HEPATIC FUNCTION PANEL - Abnormal; Notable for the following:    Indirect Bilirubin 1.1 (*)    All other components within normal limits  TROPONIN I  LIPASE, BLOOD   ____________________________________________  EKG  ED ECG REPORT I, Nita Sickle, the attending physician, personally viewed and interpreted this ECG.  Sinus tachycardia, rate of 104, normal intervals, normal axis, ST depressions in inferior leads, no ST elevations. Unchange from prior from yesterday. ____________________________________________  RADIOLOGY  CXR: Negative  ____________________________________________   PROCEDURES  Procedure(s) performed: None Procedures Critical Care performed:  None ____________________________________________   INITIAL IMPRESSION / ASSESSMENT AND PLAN /  ED COURSE   51 y.o. female h/o erosive gastritis and PUD seen here yesterday for severe abdominal pain, N/V who returns today for same complaints. Patient had CT abdomen and pelvis done yesterday with no evidence of perforation. She has tenderness to palpation in the epigastric region. She is afebrile with a pulse of 103. Remaining of her vital signs are within normal limits. Blood work showing improving leukocytosis from 18.7 yesterday to 13.3. EKG and troponin were no ischemic changes. Creatinine and anion gap within normal limits. Chest x-ray with no pneumoperitoneum. Presentation concerning for erosive gastritis flare vs PUD. Will give IV Dilaudid, IV Reglan, IV fluids, GI cocktail, IV Pepcid. Anticipate admission since this is patient's second visit for similar complaints.  Clinical Course as of Jul 22 2098  Tue Jul 22, 2016  2058 Patient  continues to complain of 7 out of 10 pain. We'll admit to the hospitalist service. Labs and no acute finding.  [CV]    Clinical Course User Index [CV] Nita Sickle, MD    Pertinent labs & imaging results that were available during my care of the patient were reviewed by me and considered in my medical decision making (see chart for details).    ____________________________________________   FINAL CLINICAL IMPRESSION(S) / ED DIAGNOSES  Final diagnoses:  Nausea vomiting and diarrhea  Epigastric abdominal pain      NEW MEDICATIONS STARTED DURING THIS VISIT:  New Prescriptions   No medications on file     Note:  This document was prepared using Dragon voice recognition software and may include unintentional dictation errors.    Nita Sickle, MD 07/22/16 2100

## 2016-07-22 NOTE — ED Provider Notes (Signed)
I assumed care of the patient from Dr. Judd GaudierQaule at 11:00 PM. Patient states that her nausea is controlled at this time pain is 2 out of 10. Patient is requesting discharge.   Darci Currentandolph N Earleen Aoun, MD 07/22/16 [phone]

## 2016-07-22 NOTE — ED Triage Notes (Signed)
Pt was seen last night in ED (EMS brought) - pt continues with nausea and vomiting, headache, left hand/arm numbness - c/o stabbing mid sternal chest pain

## 2016-07-22 NOTE — H&P (Signed)
[facility] - [place] at [facility]   PATIENT NAME: Lynn Larson    MR#:  [id number]  DATE OF BIRTH:  11-22-1965  DATE OF ADMISSION:  07/22/2016  PRIMARY CARE PHYSICIAN: No PCP Per Patient   REQUESTING/REFERRING PHYSICIAN: Don Perking, MD  CHIEF COMPLAINT:   Chief Complaint  Patient presents with  . Nausea  . Emesis  . Chest Pain    HISTORY OF PRESENT ILLNESS:  Lynn Larson  is a 51 y.o. female who presents with Nausea vomiting and epigastric abdominal pain. Patient states that she gets episodes of this type pain periodically. She's had it worked up extensively in the past including with gastroenterology, but has never been given a definitive answer what causes the pain. She states the pain is epigastric, sharp, comes and goes in waves. She also states she gets some associated right upper quadrant pain with radiation to her right shoulder. However, she has had cholecystectomy. She was in the ED yesterday for the same thing and was treated and felt better and asked to go home, however at home last night the pain returned with nausea and vomiting again. She came to the ED today and hospitalists were called for admission. CT scan was negative and other labs are within normal limits.  PAST MEDICAL HISTORY:   Past Medical History:  Diagnosis Date  . Asthma   . Hypertension     PAST SURGICAL HISTORY:   Past Surgical History:  Procedure Laterality Date  . CHOLECYSTECTOMY    . ESOPHAGOGASTRODUODENOSCOPY (EGD) WITH PROPOFOL [name] 08/15/2015   Procedure: ESOPHAGOGASTRODUODENOSCOPY (EGD) WITH PROPOFOL;  Surgeon: Christena Deem, MD;  Location: [facility] ENDOSCOPY;  Service: Endoscopy;  Laterality: [name];    SOCIAL HISTORY:   Social History  Substance Use Topics  . Smoking status: Current Every Day Smoker    Packs/day: 0.50    Years: 30.00    Types: Cigarettes  . Smokeless tobacco: Never Used  . Alcohol use No    FAMILY HISTORY:   Family History  Problem  Relation Age of Onset  . Breast cancer Maternal Grandmother   . Ovarian cancer Maternal Grandmother     DRUG ALLERGIES:   Allergies  Allergen Reactions  . Chantix [Varenicline]     Suicidal thoughts  . Morphine And Related Other (See Comments)    Reaction:  Redness of face     MEDICATIONS AT HOME:   Prior to Admission medications   Medication Sig Start Date End Date Taking? Authorizing Provider  alum & mag hydroxide-simeth (MAALOX/MYLANTA) 200-200-20 MG/5ML suspension Take 15-30 mLs by mouth 2 (two) times daily. 2 tablespoons in the morning and 1 tablespoon in the evening.   Yes Historical Provider, MD  metoCLOPramide (REGLAN) 5 MG tablet Take 1 tablet (5 mg total) by mouth 3 (three) times daily before meals. 01/20/16  Yes Enid Baas, MD  ondansetron (ZOFRAN) 4 MG tablet Take 1 tablet (4 mg total) by mouth daily as needed for nausea or vomiting. 07/22/16 07/22/17 Yes Darci Current, MD  pantoprazole (PROTONIX) 40 MG tablet Take 1 tablet (40 mg total) by mouth 2 (two) times daily. 01/20/16  Yes Enid Baas, MD  promethazine (PHENERGAN) 25 MG tablet Take 1 tablet (25 mg total) by mouth every 6 (six) hours as needed for nausea or vomiting. 01/20/16  Yes Enid Baas, MD  sucralfate (CARAFATE) 1 g tablet Take 1 tablet (1 g total) by mouth 4 (four) times daily -  with meals and at bedtime. Patient taking differently: Take  1 g by mouth 2 (two) times daily.  01/20/16  Yes Enid Baasadhika Kalisetti, MD    REVIEW OF SYSTEMS:  Review of Systems  Constitutional: Negative for chills, fever, malaise/fatigue and weight loss.  HENT: Negative for ear pain, hearing loss and tinnitus.   Eyes: Negative for blurred vision, double vision, pain and redness.  Respiratory: Negative for cough, hemoptysis and shortness of breath.   Cardiovascular: Negative for chest pain, palpitations, orthopnea and leg swelling.  Gastrointestinal: Positive for abdominal pain, nausea and vomiting. Negative for  constipation and diarrhea.  Genitourinary: Negative for dysuria, frequency and hematuria.  Musculoskeletal: Negative for back pain, joint pain and neck pain.  Skin:       No acne, rash, or lesions  Neurological: Negative for dizziness, tremors, focal weakness and weakness.  Endo/Heme/Allergies: Negative for polydipsia. Does not bruise/bleed easily.  Psychiatric/Behavioral: Negative for depression. The patient is not nervous/anxious and does not have insomnia.      VITAL SIGNS:   Vitals:   07/22/16 1431 07/22/16 1936 07/22/16 2026 07/22/16 2103  BP: (!) 142/117 (!) 126/97 (!) 142/94 109/64  Pulse: (!) 103 89 78 66  Resp: 20 16 18 18   Temp: 98.7 F (37.1 C)     TempSrc: Oral     SpO2: 97% 98% 95% 98%  Weight: 72.6 kg (160 lb)     Height: 5\' 3"  (1.6 m)      Wt Readings from Last 3 Encounters:  07/22/16 72.6 kg (160 lb)  07/21/16 76.7 kg (169 lb)  01/16/16 72.1 kg (158 lb 14.4 oz)    PHYSICAL EXAMINATION:  Physical Exam  Vitals reviewed. Constitutional: She is oriented to person, place, and time. She appears well-developed and well-nourished. No distress.  HENT:  Head: Normocephalic and atraumatic.  Mouth/Throat: Oropharynx is clear and moist.  Eyes: Conjunctivae and EOM are normal. Pupils are equal, round, and reactive to light. No scleral icterus.  Neck: Normal range of motion. Neck supple. No JVD present. No thyromegaly present.  Cardiovascular: Normal rate, regular rhythm and intact distal pulses.  Exam reveals no gallop and no friction rub.   No murmur heard. Respiratory: Effort normal and breath sounds normal. No respiratory distress. She has no wheezes. She has no rales.  GI: Soft. Bowel sounds are normal. She exhibits no distension. There is tenderness.  Musculoskeletal: Normal range of motion. She exhibits no edema.  No arthritis, no gout  Lymphadenopathy:    She has no cervical adenopathy.  Neurological: She is alert and oriented to person, place, and time. No  cranial nerve deficit.  No dysarthria, no aphasia  Skin: Skin is warm and dry. No rash noted. No erythema.  Psychiatric: She has a normal mood and affect. Her behavior is normal. Judgment and thought content normal.    LABORATORY PANEL:   CBC  Recent Labs Lab 07/22/16 1509  WBC 13.3*  HGB 15.5  HCT 43.6  PLT 380   ------------------------------------------------------------------------------------------------------------------  Chemistries   Recent Labs Lab 07/22/16 1509  NA 139  K 3.5  CL 104  CO2 27  GLUCOSE 118*  BUN 16  CREATININE 0.62  CALCIUM 9.6  AST 35  ALT 27  ALKPHOS 92  BILITOT 1.2   ------------------------------------------------------------------------------------------------------------------  Cardiac Enzymes  Recent Labs Lab 07/22/16 1509  TROPONINI <0.03   ------------------------------------------------------------------------------------------------------------------  RADIOLOGY:  Dg Chest 2 View  Result Date: 07/22/2016 CLINICAL DATA:  Nausea and vomiting, headache, left hand numbness, mid sternal chest pain EXAM: CHEST  2 VIEW COMPARISON:  08/14/2015  FINDINGS: Cardiomediastinal silhouette is stable. No infiltrate or pleural effusion. No pulmonary edema. Bony thorax is unremarkable. IMPRESSION: No active cardiopulmonary disease. Electronically Signed   By: Natasha Mead M.D.   On: 07/22/2016 15:59   Ct Abdomen Pelvis W Contrast  Result Date: 07/21/2016 CLINICAL DATA:  Severe epigastric pain for 7 days, vomiting and diarrhea for 3 hours. History of cholecystectomy and C-section. EXAM: CT ABDOMEN AND PELVIS WITH CONTRAST TECHNIQUE: Multidetector CT imaging of the abdomen and pelvis was performed using the standard protocol following bolus administration of intravenous contrast. CONTRAST:  ISOVUE-300 IOPAMIDOL (ISOVUE-300) INJECTION 61% COMPARISON:  CT abdomen dated 01/16/2016 and CT abdomen dated 08/13/2015. FINDINGS: Lower chest: No acute  abnormality. Hepatobiliary: Mild peripheral nodularity of the liver suspicious for early/mild cirrhosis. Patient is status post cholecystectomy with associated mild bile duct ectasia. Pancreas: Stable mild prominence of the pancreatic duct similar to multiple previous exams suggesting benignity. No acute findings. No peripancreatic fluid/inflammation. Spleen: Normal in size without focal abnormality. Adrenals/Urinary Tract: Adrenal glands appear normal. Small renal cysts again noted bilaterally. No renal stone or hydronephrosis. No perinephric inflammation. No ureteral or bladder calculi identified. Bladder appears normal, partially decompressed. Stomach/Bowel: Bowel is normal in caliber. As indicated on multiple prior CT reports, there is a mild diffuse thickening of the colonic walls which appears stable and presumably chronic. No pericolonic fluid or inflammation to suggest an acute colitis. Stomach appears normal. Appendix is normal. Vascular/Lymphatic: Scattered atherosclerotic changes of the normal caliber abdominal aorta. No enlarged lymph nodes seen. Reproductive: Uterus and bilateral adnexa are unremarkable. Other: No free fluid or abscess collection. No free intraperitoneal air. Musculoskeletal: No acute or suspicious osseous finding. Superficial soft tissues are unremarkable. IMPRESSION: 1. No acute findings. No bowel obstruction or evidence of acute bowel wall inflammation. No free fluid or abscess. No free intraperitoneal air. No evidence of acute solid organ abnormality. 2. Stable mild prominence/thickening of the colonic walls, presumably sequela of previous infectious or inflammatory colitis. Again, no pericolonic inflammation or other evidence of acute colitis. 3. Questionable mild nodularity of the peripheral liver contours raising the possibility of an early or mild cirrhosis. Recommend correlation with liver function tests. 4. Aortic atherosclerosis. Electronically Signed   By: Bary Richard M.D.    On: 07/21/2016 21:47    EKG:   Orders placed or performed during the hospital encounter of 07/22/16  . ED EKG within 10 minutes  . ED EKG within 10 minutes  . EKG 12-Lead  . EKG 12-Lead    IMPRESSION AND PLAN:  Principal Problem:   Epigastric abdominal pain - when necessary analgesics, nothing by mouth for now for bowel rest Active Problems:   Nausea & vomiting - when necessary antiemetics   Dehydration - IV fluids   HTN (hypertension) - not on anything at home, blood pressure has been a year during episodes of pain, but falls to a controlled level otherwise. Monitor and treat when necessary  All the records are reviewed and case discussed with ED provider. Management plans discussed with the patient and/or family.  DVT PROPHYLAXIS: SubQ lovenox  GI PROPHYLAXIS: PPI  ADMISSION STATUS: Observation  CODE STATUS: Full Code Status History    Date Active Date Inactive Code Status Order ID Comments User Context   01/16/2016  9:33 PM 01/20/2016  3:07 PM Full Code [id number]  Houston Siren, MD ED   08/14/2015  5:33 PM 08/17/2015  2:44 PM Full Code [id number]  [name], MD Inpatient   02/15/2015  8:31  PM 02/18/2015 10:31 PM Full Code [id number]  Katha Hamming, MD ED      TOTAL TIME TAKING CARE OF THIS PATIENT: 40 minutes.    Dian Minahan Larson 07/22/2016, 9:36 PM  [facility]  Office  [phone]  CC: Primary care physician; No PCP Per Patient

## 2016-07-22 NOTE — ED Notes (Signed)
ED Provider at bedside. 

## 2016-07-23 LAB — BASIC METABOLIC PANEL
ANION GAP: 8 (ref 5–15)
BUN: 15 mg/dL (ref 6–20)
CALCIUM: 8.7 mg/dL — AB (ref 8.9–10.3)
CHLORIDE: 107 mmol/L (ref 101–111)
CO2: 24 mmol/L (ref 22–32)
Creatinine, Ser: 0.73 mg/dL (ref 0.44–1.00)
GFR calc non Af Amer: >60 mL/min (ref >60)
GLUCOSE: 85 mg/dL (ref 65–99)
POTASSIUM: 3.6 mmol/L (ref 3.5–5.1)
Sodium: 139 mmol/L (ref 135–145)

## 2016-07-23 LAB — CBC
HEMATOCRIT: 39.2 % (ref 35.0–47.0)
HEMOGLOBIN: 13.2 g/dL (ref 12.0–16.0)
MCH: 29.4 pg (ref 26.0–34.0)
MCHC: 33.6 g/dL (ref 32.0–36.0)
MCV: 87.3 fL (ref 80.0–100.0)
PLATELETS: 308 10*3/uL (ref 150–440)
RBC: 4.49 MIL/uL (ref 3.80–5.20)
RDW: 14.5 % (ref 11.5–14.5)
WBC: 9.9 10*3/uL (ref 3.6–11.0)

## 2016-07-23 MED ORDER — SODIUM CHLORIDE 0.9 % IV SOLN
INTRAVENOUS | Status: DC
  Administered 2016-07-23 – 2016-07-24 (×2): via INTRAVENOUS

## 2016-07-23 NOTE — Plan of Care (Signed)
Problem: Nutrition: Goal: Adequate nutrition will be maintained Outcome: Not Progressing Patient is NPO.   

## 2016-07-23 NOTE — Progress Notes (Signed)
[facility] Physicians - [place] at [facility]   PATIENT NAME: Lynn Larson    MR#:  [id number]  DATE OF BIRTH:  1965/11/03  SUBJECTIVE:  CHIEF COMPLAINT:  Patient is nauseous but denies any vomiting. Denies any abdominal pain or diarrhea requesting clear liquids  REVIEW OF SYSTEMS:  CONSTITUTIONAL: No fever, fatigue or weakness.  EYES: No blurred or double vision.  EARS, NOSE, AND THROAT: No tinnitus or ear pain.  RESPIRATORY: No cough, shortness of breath, wheezing or hemoptysis.  CARDIOVASCULAR: No chest pain, orthopnea, edema.  GASTROINTESTINAL: Reporting nausea,Denies vomiting, diarrhea or abdominal pain.  GENITOURINARY: No dysuria, hematuria.  ENDOCRINE: No polyuria, nocturia,  HEMATOLOGY: No anemia, easy bruising or bleeding SKIN: No rash or lesion. MUSCULOSKELETAL: No joint pain or arthritis.   NEUROLOGIC: No tingling, numbness, weakness.  PSYCHIATRY: No anxiety or depression.   DRUG ALLERGIES:   Allergies  Allergen Reactions  . Chantix [Varenicline]     Suicidal thoughts  . Morphine And Related Other (See Comments)    Reaction:  Redness of face     VITALS:  Blood pressure 127/69, pulse 65, temperature 98.2 F (36.8 C), temperature source Oral, resp. rate 16, height 5\' 3"  (1.6 m), weight 72.6 kg (160 lb), SpO2 98 %.  PHYSICAL EXAMINATION:  GENERAL:  51 y.o.-year-old patient lying in the bed with no acute distress.  EYES: Pupils equal, round, reactive to light and accommodation. No scleral icterus. Extraocular muscles intact.  HEENT: Head atraumatic, normocephalic. Oropharynx and nasopharynx clear.  NECK:  Supple, no jugular venous distention. No thyroid enlargement, no tenderness.  LUNGS: Normal breath sounds bilaterally, no wheezing, rales,rhonchi or crepitation. No use of accessory muscles of respiration.  CARDIOVASCULAR: S1, S2 normal. No murmurs, rubs, or gallops.  ABDOMEN: Soft, nontender, nondistended. Bowel sounds present. No organomegaly or  mass.  EXTREMITIES: No pedal edema, cyanosis, or clubbing.  NEUROLOGIC: Cranial nerves II through XII are intact. Muscle strength 5/5 in all extremities. Sensation intact. Gait not checked.  PSYCHIATRIC: The patient is alert and oriented x 3.  SKIN: No obvious rash, lesion, or ulcer.    LABORATORY PANEL:   CBC  Recent Labs Lab 07/23/16 0623  WBC 9.9  HGB 13.2  HCT 39.2  PLT 308   ------------------------------------------------------------------------------------------------------------------  Chemistries   Recent Labs Lab 07/22/16 1509 07/23/16 0623  NA 139 139  K 3.5 3.6  CL 104 107  CO2 27 24  GLUCOSE 118* 85  BUN 16 15  CREATININE 0.62 0.73  CALCIUM 9.6 8.7*  AST 35  --   ALT 27  --   ALKPHOS 92  --   BILITOT 1.2  --    ------------------------------------------------------------------------------------------------------------------  Cardiac Enzymes  Recent Labs Lab 07/22/16 1509  TROPONINI <0.03   ------------------------------------------------------------------------------------------------------------------  RADIOLOGY:  Dg Chest 2 View  Result Date: 07/22/2016 CLINICAL DATA:  Nausea and vomiting, headache, left hand numbness, mid sternal chest pain EXAM: CHEST  2 VIEW COMPARISON:  08/14/2015 FINDINGS: Cardiomediastinal silhouette is stable. No infiltrate or pleural effusion. No pulmonary edema. Bony thorax is unremarkable. IMPRESSION: No active cardiopulmonary disease. Electronically Signed   By: Natasha Mead M.D.   On: 07/22/2016 15:59   Ct Abdomen Pelvis W Contrast  Result Date: 07/21/2016 CLINICAL DATA:  Severe epigastric pain for 7 days, vomiting and diarrhea for 3 hours. History of cholecystectomy and C-section. EXAM: CT ABDOMEN AND PELVIS WITH CONTRAST TECHNIQUE: Multidetector CT imaging of the abdomen and pelvis was performed using the standard protocol following bolus administration of intravenous contrast.  CONTRAST:  100mL ISOVUE-300 IOPAMIDOL  (ISOVUE-300) INJECTION 61% COMPARISON:  CT abdomen dated 01/16/2016 and CT abdomen dated 08/13/2015. FINDINGS: Lower chest: No acute abnormality. Hepatobiliary: Mild peripheral nodularity of the liver suspicious for early/mild cirrhosis. Patient is status post cholecystectomy with associated mild bile duct ectasia. Pancreas: Stable mild prominence of the pancreatic duct similar to multiple previous exams suggesting benignity. No acute findings. No peripancreatic fluid/inflammation. Spleen: Normal in size without focal abnormality. Adrenals/Urinary Tract: Adrenal glands appear normal. Small renal cysts again noted bilaterally. No renal stone or hydronephrosis. No perinephric inflammation. No ureteral or bladder calculi identified. Bladder appears normal, partially decompressed. Stomach/Bowel: Bowel is normal in caliber. As indicated on multiple prior CT reports, there is a mild diffuse thickening of the colonic walls which appears stable and presumably chronic. No pericolonic fluid or inflammation to suggest an acute colitis. Stomach appears normal. Appendix is normal. Vascular/Lymphatic: Scattered atherosclerotic changes of the normal caliber abdominal aorta. No enlarged lymph nodes seen. Reproductive: Uterus and bilateral adnexa are unremarkable. Other: No free fluid or abscess collection. No free intraperitoneal air. Musculoskeletal: No acute or suspicious osseous finding. Superficial soft tissues are unremarkable. IMPRESSION: 1. No acute findings. No bowel obstruction or evidence of acute bowel wall inflammation. No free fluid or abscess. No free intraperitoneal air. No evidence of acute solid organ abnormality. 2. Stable mild prominence/thickening of the colonic walls, presumably sequela of previous infectious or inflammatory colitis. Again, no pericolonic inflammation or other evidence of acute colitis. 3. Questionable mild nodularity of the peripheral liver contours raising the possibility of an early or mild  cirrhosis. Recommend correlation with liver function tests. 4. Aortic atherosclerosis. Electronically Signed   By: Bary RichardStan  Maynard M.D.   On: 07/21/2016 21:47    EKG:   Orders placed or performed during the hospital encounter of 07/22/16  . EKG 12-Lead  . EKG 12-Lead    ASSESSMENT AND PLAN:    Epigastric abdominal pain -could be from acute gastritis  improving  when necessary analgesics,  Clear liquid diet and advance as tolerated    Nausea & vomiting - could be from acute gastritis  when necessary antiemetics PPI, IV fluids  Avoid NSAIDs     Dehydration -from nausea and vomiting  IV fluids    HTN (hypertension) - not on anything at home,    All the records are reviewed and case discussed with Care Management/Social Workerr. Management plans discussed with the patient, family and they are in agreement.  CODE STATUS: fc   TOTAL TIME TAKING CARE OF THIS PATIENT: 36  minutes.   POSSIBLE D/C IN 1-2 DAYS, DEPENDING ON CLINICAL CONDITION.  Note: This dictation was prepared with Dragon dictation along with smaller phrase technology. Any transcriptional errors that result from this process are unintentional.   Ramonita LabGouru, Marvetta Vohs M.D on 07/23/2016 at 5:04 PM  Between 7am to 6pm - Pager - [phone] After 6pm go to [email or web address] - password [email or web address] [email or web address]  [facility]  Office  [phone]  CC: Primary care physician; No PCP Per Patient

## 2016-07-23 NOTE — Progress Notes (Signed)
Per Dr. Amado CoeGouru place order for Normal saline at 75 for 1 liter. Clear liquid diet and advance as tolerated

## 2016-07-24 MED ORDER — ACETAMINOPHEN 325 MG PO TABS: 650.0000 mg | ORAL_TABLET | Freq: Four times a day (QID) | ORAL | Status: AC | PRN

## 2016-07-24 MED ORDER — NICOTINE 7 MG/24HR TD PT24: 7.0000 mg | MEDICATED_PATCH | Freq: Every day | TRANSDERMAL | 0 refills | Status: DC

## 2016-07-24 MED ORDER — SUCRALFATE 1 G PO TABS: 1.0000 g | ORAL_TABLET | Freq: Three times a day (TID) | ORAL | 2 refills | Status: AC

## 2016-07-24 MED ORDER — ONDANSETRON HCL 4 MG PO TABS: 4.0000 mg | ORAL_TABLET | Freq: Three times a day (TID) | ORAL | 0 refills | Status: DC | PRN

## 2016-07-24 NOTE — Care Management (Signed)
Patient to pick up zofran at [facility] at no cost.  Patient provided coupon from [employer] for Carafate  For $31.26.  Patient states that she will be able afford this.  RNCM signing off.

## 2016-07-24 NOTE — Care Management (Signed)
Patient admitted with Epigastric abdominal pain .  Patient self pay.  Patient lives with significant other. Patient states that she is employed, denies any issues with transportation. States that she may have difficulty paying for her medications at discharge depending on the cost.  [facility] admission [facility], however patient states that she did not keep that appointment.  [facility] but is not an [facility] patient.  I [facility] the [facility], but the patient states that she plans on moving to [place] within the next 1-2 months.  Patient to discharge today,  RNCM following to see which meds patient to discharge on

## 2016-07-24 NOTE — Progress Notes (Signed)
Okay to discontinue IV fluids and place order for soft diet

## 2016-07-24 NOTE — Progress Notes (Signed)
Pt A and O x 4. VSS. Pt tolerating diet well. No complaints of pain or nausea. IV removed intact, prescriptions given. Pt voiced understanding of discharge instructions with no further questions. Pt discharged via wheelchair with axillary.   

## 2016-07-24 NOTE — Discharge Summary (Signed)
[facility]   PATIENT NAME: Lynn Larson    MR#:  [id number]  DATE OF BIRTH:  1965/12/24  DATE OF ADMISSION:  07/22/2016 ADMITTING PHYSICIAN: Oralia Manis, MD  DATE OF DISCHARGE: 07/24/16 PRIMARY CARE PHYSICIAN: No PCP Per Patient    ADMISSION DIAGNOSIS:  Epigastric abdominal pain [R10.13] Nausea vomiting and diarrhea [R11.2, R19.7]  DISCHARGE DIAGNOSIS:   Acute epigastric abdominal pain with nausea vomiting resolved History of gastric ulcers-chronic SECONDARY DIAGNOSIS:   Past Medical History:  Diagnosis Date  . Asthma   . Hypertension     HOSPITAL COURSE:   History of present illness at the time of admission Lynn Larson  is a 51 y.o. female who presents with Nausea vomiting and epigastric abdominal pain. Patient states that she gets episodes of this type pain periodically. She's had it worked up extensively in the past including with gastroenterology, but has never been given a definitive answer what causes the pain. She states the pain is epigastric, sharp, comes and goes in waves. She also states she gets some associated right upper quadrant pain with radiation to her right shoulder. However, she has had cholecystectomy. She was in the ED yesterday for the same thing and was treated and felt better and asked to go home, however at home last night the pain returned with nausea and vomiting again. She came to the ED today and hospitalists were called for admission. CT scan was negative and other labs are within normal limits, please review history and physical for details  Hospital course  Epigastric abdominal pain with nausea and vomiting -could be from acute gastritis/history of gastric ulcers  improved  when necessary analgesics,  Advanced diet as tolerated Carafate  Nausea &vomiting -from acute gastritis  when necessary antiemetics given. Patient clinically improved PPI, IV fluids given Avoid NSAIDs, caffeine  products and counseled to quit smoking     Dehydration -from nausea and vomiting  IV fluids  HTN (hypertension) - not on anything at home  Tobacco abuse disorder Counseled patient to quit smoking for 4-5 minutes Nicotine patch provided. Patient is agreeable  DISCHARGE CONDITIONS:   Stable  CONSULTS OBTAINED:     PROCEDURES None  DRUG ALLERGIES:   Allergies  Allergen Reactions  . Chantix [Varenicline]     Suicidal thoughts  . Morphine And Related Other (See Comments)    Reaction:  Redness of face     DISCHARGE MEDICATIONS:   Current Discharge Medication List    START taking these medications   Details  acetaminophen (TYLENOL) 325 MG tablet Take 2 tablets (650 mg total) by mouth every 6 (six) hours as needed for mild pain (or Fever >/= 101).    nicotine (NICODERM CQ - DOSED IN MG/24 HR) 7 mg/24hr patch Place 1 patch (7 mg total) onto the skin daily. Qty: 28 patch, Refills: 0      CONTINUE these medications which have CHANGED   Details  ondansetron (ZOFRAN) 4 MG tablet Take 1 tablet (4 mg total) by mouth every 8 (eight) hours as needed for nausea or vomiting. Qty: 30 tablet, Refills: 0    sucralfate (CARAFATE) 1 g tablet Take 1 tablet (1 g total) by mouth 4 (four) times daily -  with meals and at bedtime. Qty: 120 tablet, Refills: 2      CONTINUE these medications which have NOT CHANGED   Details  alum & mag hydroxide-simeth (MAALOX/MYLANTA) 200-200-20 MG/5ML suspension Take 15-30 mLs by mouth 2 (two) times  daily. 2 tablespoons in the morning and 1 tablespoon in the evening.    metoCLOPramide (REGLAN) 5 MG tablet Take 1 tablet (5 mg total) by mouth 3 (three) times daily before meals. Qty: 90 tablet, Refills: 0    pantoprazole (PROTONIX) 40 MG tablet Take 1 tablet (40 mg total) by mouth 2 (two) times daily. Qty: 60 tablet, Refills: 2    promethazine (PHENERGAN) 25 MG tablet Take 1 tablet (25 mg total) by mouth every 6 (six) hours as needed for nausea or  vomiting. Qty: 20 tablet, Refills: 0         DISCHARGE INSTRUCTIONS:   F/u with PCP in a week Stop smoking    DIET:  Low-salt diet  DISCHARGE CONDITION:  Stable  ACTIVITY:  Activity as tolerated  OXYGEN:  Home Oxygen: No.   Oxygen Delivery: room air  DISCHARGE LOCATION:  home   If you experience worsening of your admission symptoms, develop shortness of breath, life threatening emergency, suicidal or homicidal thoughts you must seek medical attention immediately by calling 911 or calling your MD immediately  if symptoms less severe.  You Must read complete instructions/literature along with all the possible adverse reactions/side effects for all the Medicines you take and that have been prescribed to you. Take any new Medicines after you have completely understood and accpet all the possible adverse reactions/side effects.   Please note  You were cared for by a hospitalist during your hospital stay. If you have any questions about your discharge medications or the care you received while you were in the hospital after you are discharged, you can call the unit and asked to speak with the hospitalist on call if the hospitalist that took care of you is not available. Once you are discharged, your primary care physician will handle any further medical issues. Please note that NO REFILLS for any discharge medications will be authorized once you are discharged, as it is imperative that you return to your primary care physician (or establish a relationship with a primary care physician if you do not have one) for your aftercare needs so that they can reassess your need for medications and monitor your lab values.     Today  Chief Complaint  Patient presents with  . Nausea  . Emesis  . Chest Pain   Patient's nausea vomiting and abdominal pain resolved. Tolerating  advanced diet Wants to go home  ROS:  CONSTITUTIONAL: Denies fevers, chills. Denies any fatigue, weakness.   EYES: Denies blurry vision, double vision, eye pain. EARS, NOSE, THROAT: Denies tinnitus, ear pain, hearing loss. RESPIRATORY: Denies cough, wheeze, shortness of breath.  CARDIOVASCULAR: Denies chest pain, palpitations, edema.  GASTROINTESTINAL: Denies nausea, vomiting, diarrhea, abdominal pain. Denies bright red blood per rectum. GENITOURINARY: Denies dysuria, hematuria. ENDOCRINE: Denies nocturia or thyroid problems. HEMATOLOGIC AND LYMPHATIC: Denies easy bruising or bleeding. SKIN: Denies rash or lesion. MUSCULOSKELETAL: Denies pain in neck, back, shoulder, knees, hips or arthritic symptoms.  NEUROLOGIC: Denies paralysis, paresthesias.  PSYCHIATRIC: Denies anxiety or depressive symptoms.   VITAL SIGNS:  Blood pressure (!) 142/84, pulse (!) 57, temperature 98.2 F (36.8 C), temperature source Oral, resp. rate 20, height 5\' 3"  (1.6 m), weight 72.6 kg (160 lb), SpO2 96 %.  I/O:    Intake/Output Summary (Last 24 hours) at 07/24/16 1300 Last data filed at 07/24/16 1204  Gross per 24 hour  Intake             4470 ml  Output  4750 ml  Net             -280 ml    PHYSICAL EXAMINATION:  GENERAL:  51 y.o.-year-old patient lying in the bed with no acute distress.  EYES: Pupils equal, round, reactive to light and accommodation. No scleral icterus. Extraocular muscles intact.  HEENT: Head atraumatic, normocephalic. Oropharynx and nasopharynx clear.  NECK:  Supple, no jugular venous distention. No thyroid enlargement, no tenderness.  LUNGS: Normal breath sounds bilaterally, no wheezing, rales,rhonchi or crepitation. No use of accessory muscles of respiration.  CARDIOVASCULAR: S1, S2 normal. No murmurs, rubs, or gallops.  ABDOMEN: Soft, non-tender, non-distended. Bowel sounds present. No organomegaly or mass.  EXTREMITIES: No pedal edema, cyanosis, or clubbing.  NEUROLOGIC: Cranial nerves II through XII are intact. Muscle strength 5/5 in all extremities. Sensation intact. Gait  not checked.  PSYCHIATRIC: The patient is alert and oriented x 3.  SKIN: No obvious rash, lesion, or ulcer.   DATA REVIEW:   CBC  Recent Labs Lab 07/23/16 0623  WBC 9.9  HGB 13.2  HCT 39.2  PLT 308    Chemistries   Recent Labs Lab 07/22/16 1509 07/23/16 0623  NA 139 139  K 3.5 3.6  CL 104 107  CO2 27 24  GLUCOSE 118* 85  BUN 16 15  CREATININE 0.62 0.73  CALCIUM 9.6 8.7*  AST 35  --   ALT 27  --   ALKPHOS 92  --   BILITOT 1.2  --     Cardiac Enzymes  Recent Labs Lab 07/22/16 1509  TROPONINI <0.03    Microbiology Results  Results for orders placed or performed during the hospital encounter of 01/16/16  C difficile quick scan w PCR reflex     Status: None   Collection Time: 01/16/16  3:40 AM  Result Value Ref Range Status   C Diff antigen NEGATIVE NEGATIVE Final   C Diff toxin NEGATIVE NEGATIVE Final   C Diff interpretation No C. difficile detected.  Final  Gastrointestinal Panel by PCR , Stool     Status: None   Collection Time: 01/16/16  8:17 PM  Result Value Ref Range Status   Campylobacter species NOT DETECTED NOT DETECTED Final   Plesimonas shigelloides NOT DETECTED NOT DETECTED Final   Salmonella species NOT DETECTED NOT DETECTED Final   Yersinia enterocolitica NOT DETECTED NOT DETECTED Final   Vibrio species NOT DETECTED NOT DETECTED Final   Vibrio cholerae NOT DETECTED NOT DETECTED Final   Enteroaggregative E coli (EAEC) NOT DETECTED NOT DETECTED Final   Enteropathogenic E coli (EPEC) NOT DETECTED NOT DETECTED Final   Enterotoxigenic E coli (ETEC) NOT DETECTED NOT DETECTED Final   Shiga like toxin producing E coli (STEC) NOT DETECTED NOT DETECTED Final   E. coli O157 NOT DETECTED NOT DETECTED Final   Shigella/Enteroinvasive E coli (EIEC) NOT DETECTED NOT DETECTED Final   Cryptosporidium NOT DETECTED NOT DETECTED Final   Cyclospora cayetanensis NOT DETECTED NOT DETECTED Final   Entamoeba histolytica NOT DETECTED NOT DETECTED Final   Giardia  lamblia NOT DETECTED NOT DETECTED Final   Adenovirus F40/41 NOT DETECTED NOT DETECTED Final   Astrovirus NOT DETECTED NOT DETECTED Final   Norovirus GI/GII NOT DETECTED NOT DETECTED Final   Rotavirus A NOT DETECTED NOT DETECTED Final   Sapovirus (I, II, IV, and V) NOT DETECTED NOT DETECTED Final    RADIOLOGY:  Dg Chest 2 View  Result Date: 07/22/2016 CLINICAL DATA:  Nausea and vomiting, headache, left hand numbness, mid  sternal chest pain EXAM: CHEST  2 VIEW COMPARISON:  08/14/2015 FINDINGS: Cardiomediastinal silhouette is stable. No infiltrate or pleural effusion. No pulmonary edema. Bony thorax is unremarkable. IMPRESSION: No active cardiopulmonary disease. Electronically Signed   By: Natasha MeadLiviu  Pop M.D.   On: 07/22/2016 15:59   Ct Abdomen Pelvis W Contrast  Result Date: 07/21/2016 CLINICAL DATA:  Severe epigastric pain for 7 days, vomiting and diarrhea for 3 hours. History of cholecystectomy and C-section. EXAM: CT ABDOMEN AND PELVIS WITH CONTRAST TECHNIQUE: Multidetector CT imaging of the abdomen and pelvis was performed using the standard protocol following bolus administration of intravenous contrast. CONTRAST:  100mL ISOVUE-300 IOPAMIDOL (ISOVUE-300) INJECTION 61% COMPARISON:  CT abdomen dated 01/16/2016 and CT abdomen dated 08/13/2015. FINDINGS: Lower chest: No acute abnormality. Hepatobiliary: Mild peripheral nodularity of the liver suspicious for early/mild cirrhosis. Patient is status post cholecystectomy with associated mild bile duct ectasia. Pancreas: Stable mild prominence of the pancreatic duct similar to multiple previous exams suggesting benignity. No acute findings. No peripancreatic fluid/inflammation. Spleen: Normal in size without focal abnormality. Adrenals/Urinary Tract: Adrenal glands appear normal. Small renal cysts again noted bilaterally. No renal stone or hydronephrosis. No perinephric inflammation. No ureteral or bladder calculi identified. Bladder appears normal, partially  decompressed. Stomach/Bowel: Bowel is normal in caliber. As indicated on multiple prior CT reports, there is a mild diffuse thickening of the colonic walls which appears stable and presumably chronic. No pericolonic fluid or inflammation to suggest an acute colitis. Stomach appears normal. Appendix is normal. Vascular/Lymphatic: Scattered atherosclerotic changes of the normal caliber abdominal aorta. No enlarged lymph nodes seen. Reproductive: Uterus and bilateral adnexa are unremarkable. Other: No free fluid or abscess collection. No free intraperitoneal air. Musculoskeletal: No acute or suspicious osseous finding. Superficial soft tissues are unremarkable. IMPRESSION: 1. No acute findings. No bowel obstruction or evidence of acute bowel wall inflammation. No free fluid or abscess. No free intraperitoneal air. No evidence of acute solid organ abnormality. 2. Stable mild prominence/thickening of the colonic walls, presumably sequela of previous infectious or inflammatory colitis. Again, no pericolonic inflammation or other evidence of acute colitis. 3. Questionable mild nodularity of the peripheral liver contours raising the possibility of an early or mild cirrhosis. Recommend correlation with liver function tests. 4. Aortic atherosclerosis. Electronically Signed   By: Bary RichardStan  Maynard M.D.   On: 07/21/2016 21:47    EKG:   Orders placed or performed during the hospital encounter of 07/22/16  . EKG 12-Lead  . EKG 12-Lead      Management plans discussed with the patient, family and they are in agreement.  CODE STATUS:     Code Status Orders        Start     Ordered   07/22/16 2349  Full code  Continuous     07/22/16 2348    Code Status History    Date Active Date Inactive Code Status Order ID Comments User Context   01/16/2016  9:33 PM 01/20/2016  3:07 PM Full Code [id number]  Houston SirenVivek J Sainani, MD ED   08/14/2015  5:33 PM 08/17/2015  2:44 PM Full Code [id number]  Adrian SaranSital Mody, MD Inpatient   02/15/2015   8:31 PM 02/18/2015 10:31 PM Full Code [id number]  Katha HammingSnehalatha Konidena, MD ED      TOTAL TIME TAKING CARE OF THIS PATIENT: 45  minutes.   Note: This dictation was prepared with Dragon dictation along with smaller phrase technology. Any transcriptional errors that result from this process are unintentional.   @[facility] @  on  07/24/2016 at 1:00 PM  Between 7am to 6pm - Pager - [phone]  After 6pm go to [email or web address] - password [email or web address] [facility]  [facility] [place] [facility]  Office  [phone]  CC: Primary care physician; No PCP Per Patient

## 2016-07-24 NOTE — Discharge Instructions (Signed)
Abdominal Pain, Adult Many things can cause belly (abdominal) pain. Most times, belly pain is not dangerous. Many cases of belly pain can be watched and treated at home. Sometimes belly pain is serious, though. Your doctor will try to find the cause of your belly pain. Follow these instructions at home:  Take over-the-counter and prescription medicines only as told by your doctor. Do not take medicines that help you poop (laxatives) unless told to by your doctor.  Drink enough fluid to keep your pee (urine) clear or pale yellow.  Watch your belly pain for any changes.  Keep all follow-up visits as told by your doctor. This is important. Contact a doctor if:  Your belly pain changes or gets worse.  You are not hungry, or you lose weight without trying.  You are having trouble pooping (constipated) or have watery poop (diarrhea) for more than 2-3 days.  You have pain when you pee or poop.  Your belly pain wakes you up at night.  Your pain gets worse with meals, after eating, or with certain foods.  You are throwing up and cannot keep anything down.  You have a fever. Get help right away if:  Your pain does not go away as soon as your doctor says it should.  You cannot stop throwing up.  Your pain is only in areas of your belly, such as the right side or the left lower part of the belly.  You have bloody or black poop, or poop that looks like tar.  You have very bad pain, cramping, or bloating in your belly.  You have signs of not having enough fluid or water in your body (dehydration), such as:  Dark pee, very little pee, or no pee.  Cracked lips.  Dry mouth.  Sunken eyes.  Sleepiness.  Weakness. This information is not intended to replace advice given to you by your health care provider. Make sure you discuss any questions you have with your health care provider. Document Released: 11/26/2007 Document Revised: 12/28/2015 Document Reviewed: 11/21/2015 Elsevier  Interactive Patient Education  2017 [employer]   Follow-up with primary care physician in a week

## 2016-07-27 ENCOUNTER — Emergency Department
Admission: EM | Admit: 2016-07-27 | Discharge: 2016-07-27 | Disposition: A | Discharge status: Home / Self Care | Payer: Self-pay | Attending: Emergency Medicine | Admitting: Emergency Medicine

## 2016-07-27 ENCOUNTER — Emergency Department: Payer: Self-pay

## 2016-07-27 DIAGNOSIS — R1013 Epigastric pain: Secondary | ICD-10-CM | POA: Insufficient documentation

## 2016-07-27 DIAGNOSIS — R112 Nausea with vomiting, unspecified: Secondary | ICD-10-CM | POA: Insufficient documentation

## 2016-07-27 DIAGNOSIS — I10 Essential (primary) hypertension: Secondary | ICD-10-CM | POA: Insufficient documentation

## 2016-07-27 DIAGNOSIS — Z79899 Other long term (current) drug therapy: Secondary | ICD-10-CM | POA: Insufficient documentation

## 2016-07-27 DIAGNOSIS — J45909 Unspecified asthma, uncomplicated: Secondary | ICD-10-CM | POA: Insufficient documentation

## 2016-07-27 DIAGNOSIS — F17210 Nicotine dependence, cigarettes, uncomplicated: Secondary | ICD-10-CM | POA: Insufficient documentation

## 2016-07-27 LAB — CBC
HEMATOCRIT: 43.7 % (ref 35.0–47.0)
Hemoglobin: 15.4 g/dL (ref 12.0–16.0)
MCH: 29.7 pg (ref 26.0–34.0)
MCHC: 35.2 g/dL (ref 32.0–36.0)
MCV: 84.4 fL (ref 80.0–100.0)
PLATELETS: 404 10*3/uL (ref 150–440)
RBC: 5.18 MIL/uL (ref 3.80–5.20)
RDW: 15 % — ABNORMAL HIGH (ref 11.5–14.5)
WBC: 15.9 10*3/uL — ABNORMAL HIGH (ref 3.6–11.0)

## 2016-07-27 LAB — COMPREHENSIVE METABOLIC PANEL
ALBUMIN: 4.7 g/dL (ref 3.5–5.0)
ALK PHOS: 92 U/L (ref 38–126)
ALT: 26 U/L (ref 14–54)
AST: 24 U/L (ref 15–41)
Anion gap: 10 (ref 5–15)
BUN: 19 mg/dL (ref 6–20)
CALCIUM: 9.7 mg/dL (ref 8.9–10.3)
CHLORIDE: 105 mmol/L (ref 101–111)
CO2: 25 mmol/L (ref 22–32)
Creatinine, Ser: 0.74 mg/dL (ref 0.44–1.00)
GFR calc non Af Amer: >60 mL/min (ref >60)
GLUCOSE: 132 mg/dL — AB (ref 65–99)
Potassium: 3.4 mmol/L — ABNORMAL LOW (ref 3.5–5.1)
SODIUM: 140 mmol/L (ref 135–145)
Total Bilirubin: 0.5 mg/dL (ref 0.3–1.2)
Total Protein: 8.1 g/dL (ref 6.5–8.1)

## 2016-07-27 LAB — TROPONIN I: Troponin I: <0.03 ng/mL (ref <0.03)

## 2016-07-27 LAB — LIPASE, BLOOD: Lipase: 77 U/L — ABNORMAL HIGH (ref 11–51)

## 2016-07-27 MED ORDER — IOPAMIDOL (ISOVUE-300) INJECTION 61%
100.0000 mL | Freq: Once | INTRAVENOUS | Status: AC | PRN
  Administered 2016-07-27: 100 mL via INTRAVENOUS

## 2016-07-27 MED ORDER — PROMETHAZINE HCL 25 MG/ML IJ SOLN
12.5000 mg | Freq: Once | INTRAMUSCULAR | Status: AC
  Administered 2016-07-27: 12.5 mg via INTRAMUSCULAR
  Filled 2016-07-27: qty 1

## 2016-07-27 MED ORDER — HYDROMORPHONE HCL 1 MG/ML IJ SOLN
1.0000 mg | INTRAMUSCULAR | Status: AC
  Administered 2016-07-27: 1 mg via INTRAVENOUS
  Filled 2016-07-27: qty 1

## 2016-07-27 MED ORDER — IOPAMIDOL (ISOVUE-300) INJECTION 61%: 100.0000 mL | Freq: Once | INTRAVENOUS | Status: DC | PRN

## 2016-07-27 MED ORDER — ONDANSETRON HCL 4 MG/2ML IJ SOLN
4.0000 mg | Freq: Once | INTRAMUSCULAR | Status: AC
  Administered 2016-07-27: 4 mg via INTRAVENOUS
  Filled 2016-07-27: qty 2

## 2016-07-27 MED ORDER — HYDROMORPHONE HCL 1 MG/ML IJ SOLN
0.5000 mg | INTRAMUSCULAR | Status: AC
  Administered 2016-07-27: 0.5 mg via INTRAVENOUS
  Filled 2016-07-27: qty 1

## 2016-07-27 MED ORDER — GI COCKTAIL ~~LOC~~
30.0000 mL | Freq: Once | ORAL | Status: AC
  Administered 2016-07-27: 30 mL via ORAL
  Filled 2016-07-27: qty 30

## 2016-07-27 MED ORDER — SODIUM CHLORIDE 0.9 % IV BOLUS (SEPSIS)
1000.0000 mL | Freq: Once | INTRAVENOUS | Status: AC
  Administered 2016-07-27: 1000 mL via INTRAVENOUS

## 2016-07-27 NOTE — ED Provider Notes (Signed)
[facility] Emergency Department Provider Note   ____________________________________________   First MD Initiated Contact with Patient 07/27/16 [phone]     (approximate)  I have reviewed the triage vital signs and the nursing notes.   HISTORY  Chief Complaint Abdominal Pain    HPI Lynn Larson is a 51 y.o. female here for evaluation of severe upper abdominal pain. She reports she was discharged Thursday, she went home and began experiencing a mild discomfort on Friday and then by last night steadily increasing pain, with the severe worsening this morning. Pain is located in the left upper abdomen. Not associated with any chest pain or trouble breathing. She reports she felt like a ball feeling in a severe pressure and squeezing in her left upper abdomen, feels the same as when she came to the hospital last. She's been vomiting multiple times yellowish green vomitus. She reports severe tenderness to 10 cramping tight squeezing pain in the left upper abdomen   Past Medical History:  Diagnosis Date  . Asthma   . Hypertension     Patient Active Problem List   Diagnosis Date Noted  . HTN (hypertension) 07/22/2016  . Asthma 07/22/2016  . Epigastric abdominal pain   . Colitis 01/16/2016  . Hematemesis 08/14/2015  . Nausea & vomiting 02/15/2015    Past Surgical History:  Procedure Laterality Date  . CHOLECYSTECTOMY    . ESOPHAGOGASTRODUODENOSCOPY (EGD) WITH PROPOFOL [name] 08/15/2015   Procedure: ESOPHAGOGASTRODUODENOSCOPY (EGD) WITH PROPOFOL;  Surgeon: Christena Deem, MD;  Location: [facility] ENDOSCOPY;  Service: Endoscopy;  Laterality: [name];    Prior to Admission medications   Medication Sig Start Date End Date Taking? Authorizing Provider  acetaminophen (TYLENOL) 325 MG tablet Take 2 tablets (650 mg total) by mouth every 6 (six) hours as needed for mild pain (or Fever >/= 101). 07/24/16   Ramonita Lab, MD  alum & mag hydroxide-simeth (MAALOX/MYLANTA)  200-200-20 MG/5ML suspension Take 15-30 mLs by mouth 2 (two) times daily. 2 tablespoons in the morning and 1 tablespoon in the evening.    Historical Provider, MD  metoCLOPramide (REGLAN) 5 MG tablet Take 1 tablet (5 mg total) by mouth 3 (three) times daily before meals. 01/20/16   Enid Baas, MD  nicotine (NICODERM CQ - DOSED IN MG/24 HR) 7 mg/24hr patch Place 1 patch (7 mg total) onto the skin daily. 07/25/16   Ramonita Lab, MD  ondansetron (ZOFRAN) 4 MG tablet Take 1 tablet (4 mg total) by mouth every 8 (eight) hours as needed for nausea or vomiting. 07/24/16 07/24/17  Ramonita Lab, MD  pantoprazole (PROTONIX) 40 MG tablet Take 1 tablet (40 mg total) by mouth 2 (two) times daily. 01/20/16   Enid Baas, MD  promethazine (PHENERGAN) 25 MG tablet Take 1 tablet (25 mg total) by mouth every 6 (six) hours as needed for nausea or vomiting. 01/20/16   Enid Baas, MD  sucralfate (CARAFATE) 1 g tablet Take 1 tablet (1 g total) by mouth 4 (four) times daily -  with meals and at bedtime. 07/24/16   Ramonita Lab, MD    Allergies Chantix [varenicline] and Morphine and related  Family History  Problem Relation Age of Onset  . Breast cancer Maternal Grandmother   . Ovarian cancer Maternal Grandmother     Social History Social History  Substance Use Topics  . Smoking status: Current Every Day Smoker    Packs/day: 0.50    Years: 30.00    Types: Cigarettes  . Smokeless tobacco: Never Used  .  Alcohol use No    Review of Systems Constitutional: No fever/chills Eyes: No visual changes. ENT: No sore throat. Cardiovascular: Denies chest pain. Respiratory: Denies shortness of breath. Gastrointestinal:  No diarrhea.  No constipation. Genitourinary: Negative for dysuria. Musculoskeletal: Negative for back pain. Skin: Negative for rash. Neurological: Negative for headaches, focal weakness or numbness.  10-point ROS otherwise  negative.  ____________________________________________   PHYSICAL EXAM:  VITAL SIGNS: ED Triage Vitals  Enc Vitals Group     BP 07/27/16 0913 (!) 151/105     Pulse Rate 07/27/16 0913 80     Resp 07/27/16 0913 12     Temp 07/27/16 0913 98.4 F (36.9 C)     Temp Source 07/27/16 0913 Oral     SpO2 07/27/16 0913 96 %     Weight 07/27/16 0911 160 lb (72.6 kg)     Height 07/27/16 0911 5\' 3"  (1.6 m)     Head Circumference --      Peak Flow --      Pain Score --      Pain Loc --      Pain Edu? --      Excl. in GC? --     Constitutional: Alert and oriented. Vision sitting up in bed, actively vomiting into emesis basin somewhat bilious to ascitic appearing. Appears in severe pain, notes she is in severe pain  Eyes: Conjunctivae are normal. PERRL. EOMI. Head: Atraumatic. Nose: No congestion/rhinnorhea. Mouth/Throat: Mucous membranes are moist.  Oropharynx non-erythematous. Neck: No stridor.   Cardiovascular: Normal rate, regular rhythm. Grossly normal heart sounds.  Good peripheral circulation. Respiratory: Normal respiratory effort.  No retractions. Lungs CTAB. Gastrointestinal: Soft and nontender except the patient reports severe tenderness to palpation in the left upper quadrant, remaining portions the abdomen do not appear overtly tender, she does appear to have some mild voluntary guarding across the top of the left upper quadrant and epigastrium. No distention. No abdominal bruits. No CVA tenderness. Musculoskeletal: No lower extremity tenderness nor edema.  No joint effusions. Neurologic:  Normal speech and language. No gross focal neurologic deficits are appreciated. No gait instability. Skin:  Skin is warm, dry and intact. No rash noted. Psychiatric: Mood and affect are normal. Speech and behavior are normal.  ____________________________________________   LABS (all labs ordered are listed, but only abnormal results are displayed)  Labs Reviewed  CBC - Abnormal; Notable  for the following:       Result Value   WBC 15.9 (*)    RDW 15.0 (*)    All other components within normal limits  COMPREHENSIVE METABOLIC PANEL - Abnormal; Notable for the following:    Potassium 3.4 (*)    Glucose, Bld 132 (*)    All other components within normal limits  LIPASE, BLOOD - Abnormal; Notable for the following:    Lipase 77 (*)    All other components within normal limits  TROPONIN I  URINALYSIS, COMPLETE (UACMP) WITH MICROSCOPIC   ____________________________________________  EKG  Reviewed and to revive me at 9:15 AM Heart rate 81 QRS 96 QTc 440 Normal sinus rhythm, no evidence of ischemia or ectopy ____________________________________________  RADIOLOGY  Ct Abdomen Pelvis W Contrast  Result Date: 07/27/2016 CLINICAL DATA:  Generalized abdominal pain.  Persistent vomiting. EXAM: CT ABDOMEN AND PELVIS WITH CONTRAST TECHNIQUE: Multidetector CT imaging of the abdomen and pelvis was performed using the standard protocol following bolus administration of intravenous contrast. CONTRAST:  ISOVUE-300 IOPAMIDOL (ISOVUE-300) INJECTION 61% COMPARISON:  CT abdomen dated  07/21/2016 and CT abdomen dated 01/16/2016. FINDINGS: Lower chest: No acute abnormality. Hepatobiliary: Again noted is subtle/mild nodularity of the peripheral liver contour suggesting cirrhosis. Status post cholecystectomy with commensurate bile duct dilatation. Pancreas: Unremarkable. No pancreatic ductal dilatation or surrounding inflammatory changes. Spleen: Normal in size without focal abnormality. Adrenals/Urinary Tract: Adrenal glands appear normal. Kidneys appear normal without mass, stone or hydronephrosis. No ureteral or bladder calculi. Stomach/Bowel: Bowel is normal in caliber. Appendix is normal. Stomach appears normal. As described on multiple earlier CT reports, there is mild diffuse thickening of the colonic walls which is not significantly changed compared to the previous exam dating back to  2014. There is no pericolonic inflammation at this time to suggest acute colitis. Walls of the small bowel appear normal. Vascular/Lymphatic: Aortic atherosclerosis. No enlarged abdominal or pelvic lymph nodes. Reproductive: Uterus and bilateral adnexa are unremarkable. Other: No free fluid or abscess collection. No free intraperitoneal air. Musculoskeletal: Mild degenerative spurring within the thoracic and lumbar spine. No acute or suspicious osseous finding. Superficial soft tissues are unremarkable. IMPRESSION: 1. No acute findings within the abdomen or pelvis. No bowel obstruction or evidence of acute bowel wall inflammation. No free fluid. No renal or ureteral calculi. No evidence of acute solid organ abnormality. Appendix is normal. 2. Stable mild diffuse thickening of the colonic walls, stable compared to multiple earlier exams, presumably sequela of previous infectious or inflammatory colitis. As above, there is no evidence of acute colitis. No evidence of gastritis or enteritis. 3. 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 4. Aortic atherosclerosis. Electronically Signed   By: Bary Richard M.D.   On: 07/27/2016 12:51    ____________________________________________   PROCEDURES  Procedure(s) performed: None  Procedures  Critical Care performed: No  ____________________________________________   INITIAL IMPRESSION / ASSESSMENT AND PLAN / ED COURSE  Pertinent labs & imaging results that were available during my care of the patient were reviewed by me and considered in my medical decision making (see chart for details).  Differential diagnosis includes but is not limited to, abdominal perforation, aortic dissection, cholecystitis, appendicitis, diverticulitis, colitis, esophagitis/gastritis, kidney stone, pyelonephritis, urinary tract infection, aortic aneurysm. All are considered in decision and treatment plan. Based upon the patient's presentation and risk factors, I suspect the patient is  re-presenting with same etiology that led to the patient's admission previous. Previous workup inpatient did not reveal an obvious etiology for the patient's pain though it is noted she has a history of previous gastric ulcer, and her symptoms resolved after inpatient treatment and conservative management  We'll treat her with Zofran, IV fluids and pain control. I discussed with the patient already repeat CT scan as she does present with an elevated white count and recurrence of her pain. She denies any cardiac or pulmonary symptomatology.  ----------------------------------------- 2:16 PM on 07/27/2016 -----------------------------------------  After IV fluids, repeat doses of pain medication and antiemetics the patient reports she feels much better. She's been able to tolerate drinking fluids by mouth, and she reports her pain and discomfort and nausea and vomiting have resolved. At this point, given her reassuring CT scan, reassuring lab work with only a notable leukocytosis, and resolution of symptoms the patient is requesting to go home and I think this is a reasonable plan. She has prescription for Zofran at home, and I will discharge her to follow-up and recommended she follow up closely with gastroenterology. Because of her repeat presentation today I did encourage recommend she come back to the walk-in clinic or emergency room tomorrow for  repeat exam in 24 hours to make sure that she is improving.  Return precautions and treatment recommendations and follow-up discussed with the patient who is agreeable with the plan.        ____________________________________________   FINAL CLINICAL IMPRESSION(S) / ED DIAGNOSES  Final diagnoses:  Epigastric abdominal pain  Non-intractable vomiting with nausea, unspecified vomiting type      NEW MEDICATIONS STARTED DURING THIS VISIT:  New Prescriptions   No medications on file     Note:  This document was prepared using Dragon voice  recognition software and may include unintentional dictation errors.     Sharyn CreamerMark Radonna Bracher, MD 07/27/16 [phone]

## 2016-07-27 NOTE — ED Notes (Signed)
Pt unable to give urine sample, Md notified

## 2016-07-27 NOTE — ED Triage Notes (Signed)
Pt came to ED via EMS from home c/o epigastric pain. 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 C/o n/v, unable to keep anything down.

## 2016-07-27 NOTE — ED Notes (Signed)
Pt able to drink water. Reports she feels much better.

## 2016-07-27 NOTE — Discharge Instructions (Signed)

## 2016-07-28 ENCOUNTER — Observation Stay
Admission: EM | Admit: 2016-07-28 | Discharge: 2016-07-30 | Disposition: A | Discharge status: Home / Self Care | Payer: Self-pay | Attending: Internal Medicine | Admitting: Internal Medicine

## 2016-07-28 ENCOUNTER — Encounter: Payer: Self-pay | Admitting: Emergency Medicine

## 2016-07-28 DIAGNOSIS — R1013 Epigastric pain: Secondary | ICD-10-CM | POA: Insufficient documentation

## 2016-07-28 DIAGNOSIS — Z79899 Other long term (current) drug therapy: Secondary | ICD-10-CM | POA: Insufficient documentation

## 2016-07-28 DIAGNOSIS — F1210 Cannabis abuse, uncomplicated: Secondary | ICD-10-CM | POA: Insufficient documentation

## 2016-07-28 DIAGNOSIS — Z885 Allergy status to narcotic agent status: Secondary | ICD-10-CM | POA: Insufficient documentation

## 2016-07-28 DIAGNOSIS — F17210 Nicotine dependence, cigarettes, uncomplicated: Secondary | ICD-10-CM | POA: Insufficient documentation

## 2016-07-28 DIAGNOSIS — Z9049 Acquired absence of other specified parts of digestive tract: Secondary | ICD-10-CM | POA: Insufficient documentation

## 2016-07-28 DIAGNOSIS — I700 Atherosclerosis of aorta: Secondary | ICD-10-CM | POA: Insufficient documentation

## 2016-07-28 DIAGNOSIS — G8929 Other chronic pain: Secondary | ICD-10-CM | POA: Insufficient documentation

## 2016-07-28 DIAGNOSIS — Z8041 Family history of malignant neoplasm of ovary: Secondary | ICD-10-CM | POA: Insufficient documentation

## 2016-07-28 DIAGNOSIS — D72829 Elevated white blood cell count, unspecified: Secondary | ICD-10-CM | POA: Insufficient documentation

## 2016-07-28 DIAGNOSIS — Z803 Family history of malignant neoplasm of breast: Secondary | ICD-10-CM | POA: Insufficient documentation

## 2016-07-28 DIAGNOSIS — Z888 Allergy status to other drugs, medicaments and biological substances status: Secondary | ICD-10-CM | POA: Insufficient documentation

## 2016-07-28 DIAGNOSIS — R112 Nausea with vomiting, unspecified: Principal | ICD-10-CM | POA: Insufficient documentation

## 2016-07-28 DIAGNOSIS — I158 Other secondary hypertension: Secondary | ICD-10-CM | POA: Insufficient documentation

## 2016-07-28 DIAGNOSIS — J45909 Unspecified asthma, uncomplicated: Secondary | ICD-10-CM | POA: Insufficient documentation

## 2016-07-28 LAB — CBC
HCT: 45.3 % (ref 35.0–47.0)
Hemoglobin: 15.8 g/dL (ref 12.0–16.0)
MCH: 29.7 pg (ref 26.0–34.0)
MCHC: 34.8 g/dL (ref 32.0–36.0)
MCV: 85.5 fL (ref 80.0–100.0)
PLATELETS: 385 10*3/uL (ref 150–440)
RBC: 5.3 MIL/uL — AB (ref 3.80–5.20)
RDW: 14.4 % (ref 11.5–14.5)
WBC: 15.4 10*3/uL — AB (ref 3.6–11.0)

## 2016-07-28 LAB — URINALYSIS, COMPLETE (UACMP) WITH MICROSCOPIC
Bacteria, UA: NONE SEEN
Bilirubin Urine: NEGATIVE
GLUCOSE, UA: NEGATIVE mg/dL
KETONES UR: NEGATIVE mg/dL
Leukocytes, UA: NEGATIVE
NITRITE: NEGATIVE
PH: 7 (ref 5.0–8.0)
Protein, ur: 30 mg/dL — AB
Specific Gravity, Urine: 1.025 (ref 1.005–1.030)

## 2016-07-28 LAB — URINE DRUG SCREEN, QUALITATIVE (ARMC ONLY)
AMPHETAMINES, UR SCREEN: NOT DETECTED
BENZODIAZEPINE, UR SCRN: NOT DETECTED
Barbiturates, Ur Screen: POSITIVE — AB
Cannabinoid 50 Ng, Ur ~~LOC~~: POSITIVE — AB
Cocaine Metabolite,Ur ~~LOC~~: NOT DETECTED
MDMA (ECSTASY) UR SCREEN: NOT DETECTED
Methadone Scn, Ur: NOT DETECTED
Opiate, Ur Screen: POSITIVE — AB
PHENCYCLIDINE (PCP) UR S: NOT DETECTED
Tricyclic, Ur Screen: NOT DETECTED

## 2016-07-28 LAB — COMPREHENSIVE METABOLIC PANEL
ALK PHOS: 94 U/L (ref 38–126)
ALT: 25 U/L (ref 14–54)
ANION GAP: 7 (ref 5–15)
AST: 25 U/L (ref 15–41)
Albumin: 4.8 g/dL (ref 3.5–5.0)
BILIRUBIN TOTAL: 0.5 mg/dL (ref 0.3–1.2)
BUN: 15 mg/dL (ref 6–20)
CALCIUM: 9.5 mg/dL (ref 8.9–10.3)
CO2: 28 mmol/L (ref 22–32)
CREATININE: 0.8 mg/dL (ref 0.44–1.00)
Chloride: 104 mmol/L (ref 101–111)
GFR calc non Af Amer: >60 mL/min (ref >60)
Glucose, Bld: 136 mg/dL — ABNORMAL HIGH (ref 65–99)
Potassium: 3.8 mmol/L (ref 3.5–5.1)
Sodium: 139 mmol/L (ref 135–145)
TOTAL PROTEIN: 8.5 g/dL — AB (ref 6.5–8.1)

## 2016-07-28 LAB — LIPASE, BLOOD: Lipase: 25 U/L (ref 11–51)

## 2016-07-28 MED ORDER — PROMETHAZINE HCL 25 MG/ML IJ SOLN
25.0000 mg | Freq: Four times a day (QID) | INTRAMUSCULAR | Status: DC | PRN
  Administered 2016-07-29 (×2): 25 mg via INTRAVENOUS
  Filled 2016-07-28 (×2): qty 1

## 2016-07-28 MED ORDER — METOCLOPRAMIDE HCL 5 MG/ML IJ SOLN
10.0000 mg | Freq: Four times a day (QID) | INTRAMUSCULAR | Status: DC
  Administered 2016-07-28 – 2016-07-29 (×3): 10 mg via INTRAVENOUS
  Filled 2016-07-28 (×3): qty 2

## 2016-07-28 MED ORDER — ONDANSETRON HCL 4 MG/2ML IJ SOLN
4.0000 mg | Freq: Once | INTRAMUSCULAR | Status: AC
  Administered 2016-07-28: 4 mg via INTRAVENOUS

## 2016-07-28 MED ORDER — SODIUM CHLORIDE 0.9 % IV SOLN
INTRAVENOUS | Status: DC
  Administered 2016-07-28 – 2016-07-29 (×2): via INTRAVENOUS

## 2016-07-28 MED ORDER — SUCRALFATE 1 G PO TABS
1.0000 g | ORAL_TABLET | Freq: Three times a day (TID) | ORAL | Status: DC
  Administered 2016-07-28 – 2016-07-30 (×8): 1 g via ORAL
  Filled 2016-07-28 (×8): qty 1

## 2016-07-28 MED ORDER — ONDANSETRON HCL 4 MG/2ML IJ SOLN
INTRAMUSCULAR | Status: AC
  Filled 2016-07-28: qty 2

## 2016-07-28 MED ORDER — HYDROMORPHONE HCL 1 MG/ML IJ SOLN
1.0000 mg | Freq: Once | INTRAMUSCULAR | Status: AC
  Administered 2016-07-28: 1 mg via INTRAVENOUS
  Filled 2016-07-28: qty 1

## 2016-07-28 MED ORDER — ACETAMINOPHEN 325 MG PO TABS: 650.0000 mg | ORAL_TABLET | Freq: Four times a day (QID) | ORAL | Status: DC | PRN

## 2016-07-28 MED ORDER — ONDANSETRON HCL 4 MG/2ML IJ SOLN
4.0000 mg | Freq: Four times a day (QID) | INTRAMUSCULAR | Status: DC
  Administered 2016-07-28 – 2016-07-30 (×9): 4 mg via INTRAVENOUS
  Filled 2016-07-28 (×9): qty 2

## 2016-07-28 MED ORDER — LORAZEPAM 2 MG/ML IJ SOLN
0.5000 mg | Freq: Once | INTRAMUSCULAR | Status: AC
  Administered 2016-07-28: 0.5 mg via INTRAVENOUS

## 2016-07-28 MED ORDER — SODIUM CHLORIDE 0.9 % IV SOLN
1000.0000 mL | Freq: Once | INTRAVENOUS | Status: AC
  Administered 2016-07-28: 1000 mL via INTRAVENOUS

## 2016-07-28 MED ORDER — LORAZEPAM 2 MG/ML IJ SOLN
INTRAMUSCULAR | Status: AC
  Filled 2016-07-28: qty 1

## 2016-07-28 MED ORDER — PROMETHAZINE HCL 25 MG/ML IJ SOLN
25.0000 mg | Freq: Once | INTRAMUSCULAR | Status: AC
  Administered 2016-07-28: 25 mg via INTRAVENOUS
  Filled 2016-07-28: qty 1

## 2016-07-28 MED ORDER — PANTOPRAZOLE SODIUM 40 MG IV SOLR
40.0000 mg | Freq: Two times a day (BID) | INTRAVENOUS | Status: DC
  Administered 2016-07-28 – 2016-07-29 (×3): 40 mg via INTRAVENOUS
  Filled 2016-07-28 (×3): qty 40

## 2016-07-28 MED ORDER — HYDROMORPHONE HCL 1 MG/ML IJ SOLN
1.0000 mg | INTRAMUSCULAR | Status: DC | PRN
  Administered 2016-07-28 – 2016-07-29 (×5): 1 mg via INTRAVENOUS
  Filled 2016-07-28 (×5): qty 1

## 2016-07-28 MED ORDER — HYDROMORPHONE HCL 1 MG/ML IJ SOLN
INTRAMUSCULAR | Status: AC
  Filled 2016-07-28: qty 1

## 2016-07-28 MED ORDER — ACETAMINOPHEN 650 MG RE SUPP: 650.0000 mg | Freq: Four times a day (QID) | RECTAL | Status: DC | PRN

## 2016-07-28 MED ORDER — HYDROMORPHONE HCL 1 MG/ML IJ SOLN
1.0000 mg | Freq: Once | INTRAMUSCULAR | Status: AC
  Administered 2016-07-28: 1 mg via INTRAVENOUS

## 2016-07-28 MED ORDER — NICOTINE 7 MG/24HR TD PT24
7.0000 mg | MEDICATED_PATCH | Freq: Every day | TRANSDERMAL | Status: DC
Start: 2016-07-28 — End: 2016-07-30
  Administered 2016-07-28 – 2016-07-30 (×3): 7 mg via TRANSDERMAL
  Filled 2016-07-28 (×3): qty 1

## 2016-07-28 NOTE — ED Triage Notes (Signed)
Pt c/o epigastric pain. Has been here multiple times for same but reports that when she gets her tests the pain isn't the same. Has had vomiting. 4 mg Zofran IM given by EMS

## 2016-07-28 NOTE — H&P (Signed)
[facility] [place] [facility]   PATIENT NAME: Lynn Larson    MR#:  [id number]  DATE OF BIRTH:  Apr 28, 1966  DATE OF ADMISSION:  07/28/2016  PRIMARY CARE PHYSICIAN: [facility]   REQUESTING/REFERRING PHYSICIAN: Dr Jene Every  CHIEF COMPLAINT:   Chief Complaint  Patient presents with  . Abdominal Pain    HISTORY OF PRESENT ILLNESS:  Lynn Larson  is a 51 y.o. female with a known history of Numerous visits in the past and this week with abdominal pain nausea vomiting. The patient was in the ER on 07/21/2016, 07/22/2016, yesterday and again today. The patient with persistent nausea vomiting and epigastric pain. She states this been a twinge of blood in the vomit. Patient states her blood pressure goes up when she is vomiting.  PAST MEDICAL HISTORY:   Past Medical History:  Diagnosis Date  . Asthma   . Hypertension     PAST SURGICAL HISTORY:   Past Surgical History:  Procedure Laterality Date  . CHOLECYSTECTOMY    . ESOPHAGOGASTRODUODENOSCOPY (EGD) WITH PROPOFOL [name] 08/15/2015   Procedure: ESOPHAGOGASTRODUODENOSCOPY (EGD) WITH PROPOFOL;  Surgeon: Christena Deem, MD;  Location: [facility] ENDOSCOPY;  Service: Endoscopy;  Laterality: [name];    SOCIAL HISTORY:   Social History  Substance Use Topics  . Smoking status: Former Smoker    Packs/day: 0.50    Years: 30.00    Types: Cigarettes  . Smokeless tobacco: Never Used  . Alcohol use No    FAMILY HISTORY:   Family History  Problem Relation Age of Onset  . Breast cancer Maternal Grandmother   . Ovarian cancer Maternal Grandmother     DRUG ALLERGIES:   Allergies  Allergen Reactions  . Chantix [Varenicline]     Suicidal thoughts  . Morphine And Related Other (See Comments)    Reaction:  Redness of face     REVIEW OF SYSTEMS:  CONSTITUTIONAL: No fever. Positive for cold chill. Positive for fatigue.  EYES: No blurred or double vision.  EARS, NOSE, AND THROAT: No tinnitus or  ear pain. No sore throat. Positive for runny nose RESPIRATORY: No cough, shortness of breath, wheezing or hemoptysis.  CARDIOVASCULAR: No chest pain, orthopnea, edema.  GASTROINTESTINAL: Positive for nausea, vomiting, and abdominal pain. Patient with hematemesis which is a twinge of blood when she vomits. No blood in bowel movements. No diarrhea GENITOURINARY: No dysuria, hematuria.  ENDOCRINE: No polyuria, nocturia,  HEMATOLOGY: No anemia, easy bruising or bleeding SKIN: No rash or lesion. MUSCULOSKELETAL: No joint pain or arthritis.   NEUROLOGIC: History of syncope. PSYCHIATRY: No anxiety or depression.   MEDICATIONS AT HOME:   Prior to Admission medications   Medication Sig Start Date End Date Taking? Authorizing Provider  acetaminophen (TYLENOL) 325 MG tablet Take 2 tablets (650 mg total) by mouth every 6 (six) hours as needed for mild pain (or Fever >/= 101). 07/24/16  Yes Ramonita Lab, MD  alum & mag hydroxide-simeth (MAALOX/MYLANTA) 200-200-20 MG/5ML suspension Take 15-30 mLs by mouth 2 (two) times daily. 2 tablespoons in the morning and 1 tablespoon in the evening.   Yes Historical Provider, MD  metoCLOPramide (REGLAN) 5 MG tablet Take 1 tablet (5 mg total) by mouth 3 (three) times daily before meals. 01/20/16  Yes Enid Baas, MD  ondansetron (ZOFRAN) 4 MG tablet Take 1 tablet (4 mg total) by mouth every 8 (eight) hours as needed for nausea or vomiting. 07/24/16 07/24/17 Yes Ramonita Lab, MD  pantoprazole (PROTONIX) 40 MG tablet Take  1 tablet (40 mg total) by mouth 2 (two) times daily. 01/20/16  Yes Enid Baas, MD  promethazine (PHENERGAN) 25 MG tablet Take 1 tablet (25 mg total) by mouth every 6 (six) hours as needed for nausea or vomiting. 01/20/16  Yes Enid Baas, MD  sucralfate (CARAFATE) 1 g tablet Take 1 tablet (1 g total) by mouth 4 (four) times daily -  with meals and at bedtime. 07/24/16  Yes Ramonita Lab, MD  nicotine (NICODERM CQ - DOSED IN MG/24 HR) 7 mg/24hr patch  Place 1 patch (7 mg total) onto the skin daily. Patient not taking: Reported on 07/28/2016 07/25/16   Ramonita Lab, MD      VITAL SIGNS:  Blood pressure (!) 172/88, pulse 80, temperature 98 F (36.7 C), temperature source Oral, resp. rate 16, height 5\' 3"  (1.6 m), weight 72.6 kg (160 lb), SpO2 93 %.  PHYSICAL EXAMINATION:  GENERAL:  51 y.o.-year-old patient lying in the bed with no acute distress.  EYES: Pupils equal, round, reactive to light and accommodation. No scleral icterus. Extraocular muscles intact.  HEENT: Head atraumatic, normocephalic. Oropharynx and nasopharynx clear.  NECK:  Supple, no jugular venous distention. No thyroid enlargement, no tenderness.  LUNGS: Normal breath sounds bilaterally, no wheezing, rales,rhonchi or crepitation. No use of accessory muscles of respiration.  CARDIOVASCULAR: S1, S2 normal. No murmurs, rubs, or gallops.  ABDOMEN: Soft, epigastric abdominal tenderness, nondistended. Bowel sounds present. No organomegaly or mass.  EXTREMITIES: No pedal edema, cyanosis, or clubbing.  NEUROLOGIC: Cranial nerves II through XII are intact. Muscle strength 5/5 in all extremities. Sensation intact. Gait not checked.  PSYCHIATRIC: The patient is alert and oriented x 3.  SKIN: No rash, lesion, or ulcer.   LABORATORY PANEL:   CBC  Recent Labs Lab 07/28/16 1124  WBC 15.4*  HGB 15.8  HCT 45.3  PLT 385   ------------------------------------------------------------------------------------------------------------------  Chemistries   Recent Labs Lab 07/28/16 1124  NA 139  K 3.8  CL 104  CO2 28  GLUCOSE 136*  BUN 15  CREATININE 0.80  CALCIUM 9.5  AST 25  ALT 25  ALKPHOS 94  BILITOT 0.5   ------------------------------------------------------------------------------------------------------------------  Cardiac Enzymes  Recent Labs Lab 07/27/16 1029  TROPONINI <0.03    ------------------------------------------------------------------------------------------------------------------  RADIOLOGY:  Ct Abdomen Pelvis W Contrast  Result Date: 07/27/2016 CLINICAL DATA:  Generalized abdominal pain.  Persistent vomiting. EXAM: CT ABDOMEN AND PELVIS WITH CONTRAST TECHNIQUE: Multidetector CT imaging of the abdomen and pelvis was performed using the standard protocol following bolus administration of intravenous contrast. CONTRAST:  ISOVUE-300 IOPAMIDOL (ISOVUE-300) INJECTION 61% COMPARISON:  CT abdomen dated 07/21/2016 and CT abdomen dated 01/16/2016. FINDINGS: Lower chest: No acute abnormality. Hepatobiliary: Again noted is subtle/mild nodularity of the peripheral liver contour suggesting cirrhosis. Status post cholecystectomy with commensurate bile duct dilatation. Pancreas: Unremarkable. No pancreatic ductal dilatation or surrounding inflammatory changes. Spleen: Normal in size without focal abnormality. Adrenals/Urinary Tract: Adrenal glands appear normal. Kidneys appear normal without mass, stone or hydronephrosis. No ureteral or bladder calculi. Stomach/Bowel: Bowel is normal in caliber. Appendix is normal. Stomach appears normal. As described on multiple earlier CT reports, there is mild diffuse thickening of the colonic walls which is not significantly changed compared to the previous exam dating back to 2014. There is no pericolonic inflammation at this time to suggest acute colitis. Walls of the small bowel appear normal. Vascular/Lymphatic: Aortic atherosclerosis. No enlarged abdominal or pelvic lymph nodes. Reproductive: Uterus and bilateral adnexa are unremarkable. Other: No free fluid or abscess  collection. No free intraperitoneal air. Musculoskeletal: Mild degenerative spurring within the thoracic and lumbar spine. No acute or suspicious osseous finding. Superficial soft tissues are unremarkable. IMPRESSION: 1. No acute findings within the abdomen or pelvis. No  bowel obstruction or evidence of acute bowel wall inflammation. No free fluid. No renal or ureteral calculi. No evidence of acute solid organ abnormality. Appendix is normal. 2. Stable mild diffuse thickening of the colonic walls, stable compared to multiple earlier exams, presumably sequela of previous infectious or inflammatory colitis. As above, there is no evidence of acute colitis. No evidence of gastritis or enteritis. 3. 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 4. Aortic atherosclerosis. Electronically Signed   By: Bary RichardStan  Maynard M.D.   On: 07/27/2016 12:51    IMPRESSION AND PLAN:   1. Persistent nausea vomiting and epigastric abdominal pain. Also a twinge of blood seen with the vomiting. The patient denied drug use but I see history of marijuana and previous urine toxicology's. This could be cannabis hyperemesis syndrome. Send a urine drug toxicology (this report will be positive for opiates and benzos since the patient did receive them in the ER prior to me ordering this test). Standing dose Zofran around the clock. When necessary Phenergan. When necessary IV dilaudid. IV Reglan. IV Protonix. Oral Carafate if able to tolerate. Nothing by mouth for right now. Hopefully can start liquids later today. Can consider GI consultation if no improvement in symptoms. Gentle IV fluid hydration 2. Accelerated hypertension secondary to pain. Try to settle down vomiting. Hold off on antihypertensive medications at this point. 3. Leukocytosis secondary to vomiting 4. CT the abdomen showed findings that could be consistent with cirrhosis. I will send off hepatitis profiles 5. History of asthma. Respiratory status stable    All the records are reviewed and case discussed with ED provider. Management plans discussed with the patient, family and they are in agreement.  CODE STATUS: Full code  TOTAL TIME TAKING CARE OF THIS PATIENT: 50 minutes.    Alford HighlandWIETING, Elieser Tetrick M.D on 07/28/2016 at 2:31 PM  Between 7am to  6pm - Pager - [phone]  After 6pm call admission pager [phone]  [facility] Office  [phone]  CC: Primary care physician; [facility]

## 2016-07-28 NOTE — ED Notes (Signed)
Pt still asleep in room.

## 2016-07-28 NOTE — ED Provider Notes (Signed)
[facility] Emergency Department Provider Note   ____________________________________________    I have reviewed the triage vital signs and the nursing notes.   HISTORY  Chief Complaint Abdominal Pain     HPI Lynn Larson is a 51 y.o. female who presents with complaints of abdominal pain. Patient complains of severe epigastric/right upper quadrant abdominal pain. Was seen here yesterday for the same but opted to go home and refused admission at that time. Also has severe nausea and vomiting. Denies fevers or chills. Has a history of a cholecystectomy   Past Medical History:  Diagnosis Date  . Asthma   . Hypertension     Patient Active Problem List   Diagnosis Date Noted  . HTN (hypertension) 07/22/2016  . Asthma 07/22/2016  . Epigastric abdominal pain   . Colitis 01/16/2016  . Hematemesis 08/14/2015  . Nausea & vomiting 02/15/2015    Past Surgical History:  Procedure Laterality Date  . CHOLECYSTECTOMY    . ESOPHAGOGASTRODUODENOSCOPY (EGD) WITH PROPOFOL [name] 08/15/2015   Procedure: ESOPHAGOGASTRODUODENOSCOPY (EGD) WITH PROPOFOL;  Surgeon: Christena Deem, MD;  Location: [facility] ENDOSCOPY;  Service: Endoscopy;  Laterality: [name];    Prior to Admission medications   Medication Sig Start Date End Date Taking? Authorizing Provider  acetaminophen (TYLENOL) 325 MG tablet Take 2 tablets (650 mg total) by mouth every 6 (six) hours as needed for mild pain (or Fever >/= 101). 07/24/16   Ramonita Lab, MD  alum & mag hydroxide-simeth (MAALOX/MYLANTA) 200-200-20 MG/5ML suspension Take 15-30 mLs by mouth 2 (two) times daily. 2 tablespoons in the morning and 1 tablespoon in the evening.    Historical Provider, MD  metoCLOPramide (REGLAN) 5 MG tablet Take 1 tablet (5 mg total) by mouth 3 (three) times daily before meals. 01/20/16   Enid Baas, MD  nicotine (NICODERM CQ - DOSED IN MG/24 HR) 7 mg/24hr patch Place 1 patch (7 mg total) onto the skin daily.  07/25/16   Ramonita Lab, MD  ondansetron (ZOFRAN) 4 MG tablet Take 1 tablet (4 mg total) by mouth every 8 (eight) hours as needed for nausea or vomiting. 07/24/16 07/24/17  Ramonita Lab, MD  pantoprazole (PROTONIX) 40 MG tablet Take 1 tablet (40 mg total) by mouth 2 (two) times daily. 01/20/16   Enid Baas, MD  promethazine (PHENERGAN) 25 MG tablet Take 1 tablet (25 mg total) by mouth every 6 (six) hours as needed for nausea or vomiting. 01/20/16   Enid Baas, MD  sucralfate (CARAFATE) 1 g tablet Take 1 tablet (1 g total) by mouth 4 (four) times daily -  with meals and at bedtime. 07/24/16   Ramonita Lab, MD     Allergies Chantix [varenicline] and Morphine and related  Family History  Problem Relation Age of Onset  . Breast cancer Maternal Grandmother   . Ovarian cancer Maternal Grandmother     Social History Social History  Substance Use Topics  . Smoking status: Current Every Day Smoker    Packs/day: 0.50    Years: 30.00    Types: Cigarettes  . Smokeless tobacco: Never Used  . Alcohol use No    Review of Systems  Constitutional: No fever/chills   Cardiovascular: Denies chest pain. Respiratory: Denies shortness of breath. Gastrointestinal: As above   Genitourinary: Negative for dysuria. Musculoskeletal: Negative for back pain. Skin: Negative for rash. Neurological: Negative for headaches   10-point ROS otherwise negative.  ____________________________________________   PHYSICAL EXAM:  VITAL SIGNS: ED Triage Vitals  Enc Vitals Group  BP 07/28/16 1126 (!) 166/113     Pulse Rate 07/28/16 1126 82     Resp 07/28/16 1126 20     Temp 07/28/16 1126 98 F (36.7 C)     Temp Source 07/28/16 1126 Oral     SpO2 07/28/16 1126 97 %     Weight 07/28/16 1118 160 lb (72.6 kg)     Height 07/28/16 1118 5\' 3"  (1.6 m)     Head Circumference --      Peak Flow --      Pain Score 07/28/16 1118 10     Pain Loc --      Pain Edu? --      Excl. in GC? --     Constitutional:  Alert and oriented. Anxious, no acute distress Eyes: Conjunctivae are normal.   Nose: No congestion/rhinnorhea. Mouth/Throat: Mucous membranes are moist.    Cardiovascular: Normal rate, regular rhythm. Grossly normal heart sounds.  Good peripheral circulation. Respiratory: Normal respiratory effort.  No retractions. Lungs CTAB. Gastrointestinal: Mild right upper quadrant tenderness to palpation and epigastric tenderness to palpation. No distention.  No CVA tenderness. Genitourinary: deferred Musculoskeletal:  Warm and well perfused Neurologic:  Normal speech and language. No gross focal neurologic deficits are appreciated.  Skin:  Skin is warm, dry and intact. No rash noted. Psychiatric: Mood and affect are normal. Speech and behavior are normal.  ____________________________________________   LABS (all labs ordered are listed, but only abnormal results are displayed)  Labs Reviewed  COMPREHENSIVE METABOLIC PANEL - Abnormal; Notable for the following:       Result Value   Glucose, Bld 136 (*)    Total Protein 8.5 (*)    All other components within normal limits  CBC - Abnormal; Notable for the following:    WBC 15.4 (*)    RBC 5.30 (*)    All other components within normal limits  LIPASE, BLOOD  URINALYSIS, COMPLETE (UACMP) WITH MICROSCOPIC   ____________________________________________  EKG  None ____________________________________________  RADIOLOGY  CT yesterday was unremarkable ____________________________________________   PROCEDURES  Procedure(s) performed: No    Critical Care performed: No ____________________________________________   INITIAL IMPRESSION / ASSESSMENT AND PLAN / ED COURSE  Pertinent labs & imaging results that were available during my care of the patient were reviewed by me and considered in my medical decision making (see chart for details).  Patient presents with complaints of epigastric abdominal pain. Recent admission for the  same. Discharged yesterday from the emergency department after being offered admission. CT from yesterday was unremarkable. We will treat with analgesics and antiemetics suspect patient will require admission    ____________________________________________   FINAL CLINICAL IMPRESSION(S) / ED DIAGNOSES  Final diagnoses:  Epigastric pain  Intractable vomiting with nausea, unspecified vomiting type      NEW MEDICATIONS STARTED DURING THIS VISIT:  New Prescriptions   No medications on file     Note:  This document was prepared using Dragon voice recognition software and may include unintentional dictation errors.    Jene Everyobert Melainie Krinsky, MD 07/28/16 [phone]

## 2016-07-28 NOTE — ED Notes (Addendum)
Dr. Cyril LoosenKinner notified, patient state she is nauseous and vomited and would like something else for pain

## 2016-07-29 LAB — CBC
HEMATOCRIT: 38.1 % (ref 35.0–47.0)
HEMOGLOBIN: 13 g/dL (ref 12.0–16.0)
MCH: 29.6 pg (ref 26.0–34.0)
MCHC: 34 g/dL (ref 32.0–36.0)
MCV: 87 fL (ref 80.0–100.0)
Platelets: 304 10*3/uL (ref 150–440)
RBC: 4.38 MIL/uL (ref 3.80–5.20)
RDW: 14.7 % — ABNORMAL HIGH (ref 11.5–14.5)
WBC: 9.6 10*3/uL (ref 3.6–11.0)

## 2016-07-29 LAB — BASIC METABOLIC PANEL
ANION GAP: 6 (ref 5–15)
BUN: 13 mg/dL (ref 6–20)
CALCIUM: 8.8 mg/dL — AB (ref 8.9–10.3)
CO2: 27 mmol/L (ref 22–32)
Chloride: 108 mmol/L (ref 101–111)
Creatinine, Ser: 0.72 mg/dL (ref 0.44–1.00)
GLUCOSE: 86 mg/dL (ref 65–99)
POTASSIUM: 3.3 mmol/L — AB (ref 3.5–5.1)
SODIUM: 141 mmol/L (ref 135–145)

## 2016-07-29 LAB — HEPATITIS B CORE ANTIBODY, TOTAL: HEP B C TOTAL AB: NEGATIVE

## 2016-07-29 LAB — HEPATITIS C ANTIBODY

## 2016-07-29 LAB — HEPATITIS B SURFACE ANTIGEN: Hepatitis B Surface Ag: NEGATIVE

## 2016-07-29 LAB — HEPATITIS B SURFACE ANTIBODY, QUANTITATIVE: Hepatitis B-Post: <3.1 m[IU]/mL — ABNORMAL LOW (ref >9.9)

## 2016-07-29 MED ORDER — HYDROMORPHONE HCL 1 MG/ML IJ SOLN
1.0000 mg | Freq: Three times a day (TID) | INTRAMUSCULAR | Status: DC | PRN
Start: 2016-07-29 — End: 2016-07-30
  Administered 2016-07-29 – 2016-07-30 (×3): 1 mg via INTRAVENOUS
  Filled 2016-07-29 (×3): qty 1

## 2016-07-29 MED ORDER — ONDANSETRON HCL 4 MG/2ML IJ SOLN: 4.0000 mg | Freq: Four times a day (QID) | INTRAMUSCULAR | Status: DC | PRN

## 2016-07-29 MED ORDER — METOCLOPRAMIDE HCL 10 MG PO TABS
5.0000 mg | ORAL_TABLET | Freq: Three times a day (TID) | ORAL | Status: DC
  Administered 2016-07-29 – 2016-07-30 (×5): 5 mg via ORAL
  Filled 2016-07-29 (×5): qty 1

## 2016-07-29 MED ORDER — PANTOPRAZOLE SODIUM 40 MG PO TBEC
40.0000 mg | DELAYED_RELEASE_TABLET | Freq: Two times a day (BID) | ORAL | Status: DC
  Administered 2016-07-29 – 2016-07-30 (×2): 40 mg via ORAL
  Filled 2016-07-29 (×2): qty 1

## 2016-07-29 MED ORDER — TRAMADOL HCL 50 MG PO TABS
50.0000 mg | ORAL_TABLET | Freq: Four times a day (QID) | ORAL | Status: DC | PRN
  Administered 2016-07-29 – 2016-07-30 (×3): 50 mg via ORAL
  Filled 2016-07-29 (×4): qty 1

## 2016-07-29 NOTE — Progress Notes (Signed)
[facility] [place] at [facility]   PATIENT NAME: Lynn GasterJudy Gjerde    MR#:  [id number]  DATE OF BIRTH:  Nov 01, 1965  SUBJECTIVE:  Abdomina pain acute on chronic Numerous admissions for same reason REVIEW OF SYSTEMS:   Review of Systems  Constitutional: Negative for chills, fever and weight loss.  HENT: Negative for ear discharge, ear pain and nosebleeds.   Eyes: Negative for blurred vision, pain and discharge.  Respiratory: Negative for sputum production, shortness of breath, wheezing and stridor.   Cardiovascular: Negative for chest pain, palpitations, orthopnea and PND.  Gastrointestinal: Positive for abdominal pain, nausea and vomiting. Negative for diarrhea.  Genitourinary: Negative for frequency and urgency.  Musculoskeletal: Negative for back pain and joint pain.  Neurological: Positive for weakness. Negative for sensory change, speech change and focal weakness.  Psychiatric/Behavioral: Negative for depression and hallucinations. The patient is not nervous/anxious.    Tolerating Diet: CLD Tolerating PT: not needed  DRUG ALLERGIES:   Allergies  Allergen Reactions  . Chantix [Varenicline]     Suicidal thoughts  . Morphine And Related Other (See Comments)    Reaction:  Redness of face     VITALS:  Blood pressure 120/69, pulse 65, temperature 98.5 F (36.9 C), temperature source Oral, resp. rate 18, height 5\' 3"  (1.6 m), weight 72.7 kg (160 lb 3.2 oz), SpO2 98 %.  PHYSICAL EXAMINATION:   Physical Exam  GENERAL:  51 y.o.-year-old patient lying in the bed with no acute distress.  EYES: Pupils equal, round, reactive to light and accommodation. No scleral icterus. Extraocular muscles intact.  HEENT: Head atraumatic, normocephalic. Oropharynx and nasopharynx clear.  NECK:  Supple, no jugular venous distention. No thyroid enlargement, no tenderness.  LUNGS: Normal breath sounds bilaterally, no wheezing, rales, rhonchi. No use of accessory muscles of  respiration.  CARDIOVASCULAR: S1, S2 normal. No murmurs, rubs, or gallops.  ABDOMEN: Soft, nontender, nondistended. Bowel sounds present. No organomegaly or mass.  EXTREMITIES: No cyanosis, clubbing or edema b/l.    NEUROLOGIC: Cranial nerves II through XII are intact. No focal Motor or sensory deficits b/l.   PSYCHIATRIC:  patient is alert and oriented x 3.  SKIN: No obvious rash, lesion, or ulcer.   LABORATORY PANEL:  CBC  Recent Labs Lab 07/29/16 0344  WBC 9.6  HGB 13.0  HCT 38.1  PLT 304    Chemistries   Recent Labs Lab 07/28/16 1124 07/29/16 0344  NA 139 141  K 3.8 3.3*  CL 104 108  CO2 28 27  GLUCOSE 136* 86  BUN 15 13  CREATININE 0.80 0.72  CALCIUM 9.5 8.8*  AST 25  --   ALT 25  --   ALKPHOS 94  --   BILITOT 0.5  --    Cardiac Enzymes  Recent Labs Lab 07/27/16 1029  TROPONINI <0.03   RADIOLOGY:  No results found. ASSESSMENT AND PLAN:  Lynn GasterJudy Gruetzmacher  is a 51 y.o. female with a known history of Numerous visits in the past and this week with abdominal pain nausea vomiting. The patient was in the ER on 07/21/2016, 07/22/2016, yesterday and again today. The patient with persistent nausea vomiting and epigastric pain.  1. Persistent nausea vomiting and epigastric abdominal pain. Multiple ER visits and admissions for same reason. ?cyclical  - Standing dose Zofran around the clock.  - When necessary IV morphine IV Reglan. IV Protonix. Oral Carafate if able to tolerate. - Gentle IV fluid hydration -Pt has had normal CT abdomen She has  had GI w/u done in the past that showed acute gastritis  2. Accelerated hypertension secondary to pain. Try to settle down vomiting. Hold off on antihypertensive medications at this point.  3. Leukocytosis secondary to vomiting  4. Substance abuse Tobacco and marijuana Advised to stop  5. History of asthma. Respiratory status stable  Case discussed with Care Management/Social Worker. Management plans discussed with the  patient, family and they are in agreement.  CODE STATUS: full  DVT Prophylaxis: lovenox  TOTAL TIME TAKING CARE OF THIS PATIENT: .  >50% time spent on counselling and coordination of care  POSSIBLE D/C IN1 DAYS, DEPENDING ON CLINICAL CONDITION.  Note: This dictation was prepared with Dragon dictation along with smaller phrase technology. Any transcriptional errors that result from this process are unintentional.  Korine Winton M.D on 07/29/2016 at 7:30 PM  Between 7am to 6pm - Pager - [phone]  After 6pm go to [email or web address] - Social research officer, government  [facility]  Office  [phone]  CC: Primary care physician; No PCP Per Patient

## 2016-07-29 NOTE — Progress Notes (Signed)
Initial Nutrition Assessment  DOCUMENTATION CODES:   Not applicable  INTERVENTION:  Advance diet per MD.  Recommend Boost Breeze po TID with diet advancement, each supplement provides 250 kcal and 9 grams of protein.  NUTRITION DIAGNOSIS:   Inadequate oral intake related to poor appetite, nausea, vomiting, other (see comment) (abdominal pain) as evidenced by per patient/family report, 8 percent weight loss over 1 month per report.  GOAL:   Patient will meet greater than or equal to 90% of their needs  MONITOR:   Diet advancement, Labs, I & O's, Weight trends  REASON FOR ASSESSMENT:   Malnutrition Screening Tool    ASSESSMENT:   51 year old female with PMHx of HTN who presents with persistent nausea, vomiting, and epigastric pain.    -Per chart patient may have cannabis hyperemesis syndrome.  Spoke with patient at bedside. She reports her appetite has been poor for two weeks now in setting of N/V and abdominal pain. 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 She reports eating a small breakfast, a sandwich for lunch, and a small dinner (patient could not provide any other details on intake).   Patient reports UBW of 174 lbs and that she has lost weight over the past 3-4 weeks. This would be a weight loss of approximately 14 lbs (8% body weight) over 1 month per report, which is significant for time frame.   Medications reviewed and include: Reglan 10 mg Q6hrs, Zofran 4 mg Q6hrs, pantoprazole, Carafate 1 gram TID with meals and bedtime, NS @ 75 ml/hr, Dilaudid PRN, Phenergan PRN.  Labs reviewed: Potassium 3.3.  Nutrition-Focused physical exam completed. Findings are no fat depletion, no muscle depletion, and no edema.   Patient is at risk for malnutrition in setting of reported significant weight loss, however does not meet the criteria at this time in absence of more information on energy intake.    Diet Order:  Diet NPO time specified Except for: Sips with Meds  Skin:  Reviewed, no issues  Last BM:  07/29/2016  Height:   Ht Readings from Last 1 Encounters:  07/28/16 5\' 3"  (1.6 m)    Weight:   Wt Readings from Last 1 Encounters:  07/28/16 160 lb 3.2 oz (72.7 kg)    Ideal Body Weight:  52.3 kg  BMI:  Body mass index is 28.38 kg/m.  Estimated Nutritional Needs:   Kcal:  1575-1710 (MSJ x 1.2-1.3)  Protein:  75-90 grams (1-1.2 grams/kg)  Fluid:  1.8-2.1 L/day (25-30 ml/kg)  EDUCATION NEEDS:   No education needs identified at this time  Helane RimaLeanne Shequila Neglia, MS, RD, LDN Pager: [phone] After Hours Pager: [phone]

## 2016-07-29 NOTE — Plan of Care (Signed)
Problem: Fluid Volume: Goal: Ability to maintain a balanced intake and output will improve Outcome: Not Applicable Date Met: 38/68/54 NPO  Problem: Nutrition: Goal: Adequate nutrition will be maintained Outcome: Not Applicable Date Met: 88/30/14 NPO

## 2016-07-29 NOTE — Plan of Care (Signed)
Problem: Pain Managment: Goal: General experience of comfort will improve Outcome: Progressing Pt does not tolerate full liquid diet. Pt vomited post dinner and c/o increased epigastric pain. Nausea meds and dilaudid given with improvement. MD is aware.Back on clear liquid.

## 2016-07-30 MED ORDER — PROMETHAZINE HCL 25 MG PO TABS
12.5000 mg | ORAL_TABLET | Freq: Once | ORAL | Status: AC
  Administered 2016-07-30: 03:00:00 12.5 mg via ORAL
  Filled 2016-07-30: qty 1

## 2016-07-30 MED ORDER — BOOST / RESOURCE BREEZE PO LIQD
1.0000 | Freq: Three times a day (TID) | ORAL | Status: DC
  Administered 2016-07-30 (×2): 1 via ORAL

## 2016-07-30 MED ORDER — BOOST / RESOURCE BREEZE PO LIQD: 1.0000 | Freq: Three times a day (TID) | ORAL | 0 refills | Status: DC

## 2016-07-30 MED ORDER — PROMETHAZINE HCL 25 MG/ML IJ SOLN: 12.5000 mg | Freq: Once | INTRAMUSCULAR | Status: DC

## 2016-07-30 MED ORDER — KETOROLAC TROMETHAMINE 15 MG/ML IJ SOLN
15.0000 mg | Freq: Three times a day (TID) | INTRAMUSCULAR | Status: DC | PRN
  Administered 2016-07-30: 15 mg via INTRAVENOUS
  Filled 2016-07-30: qty 1

## 2016-07-30 MED ORDER — MEPERIDINE HCL 25 MG/ML IJ SOLN: 12.5000 mg | Freq: Three times a day (TID) | INTRAMUSCULAR | Status: DC | PRN

## 2016-07-30 NOTE — Discharge Instructions (Signed)
Establish PCP In the area or [facility]

## 2016-07-30 NOTE — Discharge Summary (Signed)
[facility] - [place] at [facility]   PATIENT NAME: Lynn Larson    MR#:  [id number]  DATE OF BIRTH:  [birth year]  DATE OF ADMISSION:  07/28/2016 ADMITTING PHYSICIAN: Alford Highland, MD  DATE OF DISCHARGE: 07/30/16  PRIMARY CARE PHYSICIAN: No PCP Per Patient    ADMISSION DIAGNOSIS:  Epigastric pain [R10.13] Intractable vomiting with nausea, unspecified vomiting type [R11.2]  DISCHARGE DIAGNOSIS:  Recurrent Intractable nausea and vomiting Tobacco abuse Marijuana abuse Chronic Abdominal pain  SECONDARY DIAGNOSIS:   Past Medical History:  Diagnosis Date  . Asthma   . Hypertension     HOSPITAL COURSE:  JudyHammondis a 51 y.o.femalewith a known history of Numerous visits in the past and this week with abdominal pain nausea vomiting. The patient was in the ER on 07/21/2016, 07/22/2016, yesterday and again today. The patient with persistent nausea vomiting and epigastric pain.  1. Persistent nausea vomiting and epigastric abdominal pain. Multiple ER visits and admissions for same reason. ?cyclical ?gastroparesis -Standing dose Zofran around the clock.  - When necessary IV morphine IV Reglan. IV Protonix. Oral Carafate if able to tolerate.---changed to oral antiemetics - received Gentle IV fluid hydration -Pt has had normal CT abdomen -She has had GI w/u done in the past that showed acute gastritis -she had a normal Gastric emptying study done few years ago was normal  2. Accelerated hypertension secondary to pain.  - Hold off on antihypertensive medications at this point. -bp stable  3. Leukocytosis secondary to vomiting  4. Substance abuse Tobacco and marijuana Advised to stop  5. History of asthma. Respiratory status stable  D/c home later if she continues to tolerated liquid diet. Advised her to take boost breeze.  CONSULTS OBTAINED:    DRUG ALLERGIES:   Allergies  Allergen Reactions  . Chantix [Varenicline]     Suicidal  thoughts  . Morphine And Related Other (See Comments)    Reaction:  Redness of face     DISCHARGE MEDICATIONS:   Current Discharge Medication List    START taking these medications   Details  feeding supplement (BOOST / RESOURCE BREEZE) LIQD Take 1 Container by mouth 3 (three) times daily between meals. Qty: 30 Container, Refills: 0      CONTINUE these medications which have NOT CHANGED   Details  acetaminophen (TYLENOL) 325 MG tablet Take 2 tablets (650 mg total) by mouth every 6 (six) hours as needed for mild pain (or Fever >/= 101).    alum & mag hydroxide-simeth (MAALOX/MYLANTA) 200-200-20 MG/5ML suspension Take 15-30 mLs by mouth 2 (two) times daily. 2 tablespoons in the morning and 1 tablespoon in the evening.    metoCLOPramide (REGLAN) 5 MG tablet Take 1 tablet (5 mg total) by mouth 3 (three) times daily before meals. Qty: 90 tablet, Refills: 0    ondansetron (ZOFRAN) 4 MG tablet Take 1 tablet (4 mg total) by mouth every 8 (eight) hours as needed for nausea or vomiting. Qty: 30 tablet, Refills: 0    pantoprazole (PROTONIX) 40 MG tablet Take 1 tablet (40 mg total) by mouth 2 (two) times daily. Qty: 60 tablet, Refills: 2    promethazine (PHENERGAN) 25 MG tablet Take 1 tablet (25 mg total) by mouth every 6 (six) hours as needed for nausea or vomiting. Qty: 20 tablet, Refills: 0    sucralfate (CARAFATE) 1 g tablet Take 1 tablet (1 g total) by mouth 4 (four) times daily -  with meals and at bedtime. Qty: 120 tablet, Refills: 2  nicotine (NICODERM CQ - DOSED IN MG/24 HR) 7 mg/24hr patch Place 1 patch (7 mg total) onto the skin daily. Qty: 28 patch, Refills: 0        If you experience worsening of your admission symptoms, develop shortness of breath, life threatening emergency, suicidal or homicidal thoughts you must seek medical attention immediately by calling 911 or calling your MD immediately  if symptoms less severe.  You Must read complete instructions/literature  along with all the possible adverse reactions/side effects for all the Medicines you take and that have been prescribed to you. Take any new Medicines after you have completely understood and accept all the possible adverse reactions/side effects.   Please note  You were cared for by a hospitalist during your hospital stay. If you have any questions about your discharge medications or the care you received while you were in the hospital after you are discharged, you can call the unit and asked to speak with the hospitalist on call if the hospitalist that took care of you is not available. Once you are discharged, your primary care physician will handle any further medical issues. Please note that NO REFILLS for any discharge medications will be authorized once you are discharged, as it is imperative that you return to your primary care physician (or establish a relationship with a primary care physician if you do not have one) for your aftercare needs so that they can reassess your need for medications and monitor your lab values. Today   SUBJECTIVE   No vomiting today  VITAL SIGNS:  Blood pressure 134/69, pulse (!) 57, temperature 97.6 F (36.4 C), resp. rate 20, height 5\' 3"  (1.6 m), weight 72.7 kg (160 lb 3.2 oz), SpO2 96 %.  I/O:   Intake/Output Summary (Last 24 hours) at 07/30/16 1257 Last data filed at 07/30/16 1244  Gross per 24 hour  Intake             1360 ml  Output              800 ml  Net              560 ml    PHYSICAL EXAMINATION:  GENERAL:  51 y.o.-year-old patient lying in the bed with no acute distress.  EYES: Pupils equal, round, reactive to light and accommodation. No scleral icterus. Extraocular muscles intact.  HEENT: Head atraumatic, normocephalic. Oropharynx and nasopharynx clear.  NECK:  Supple, no jugular venous distention. No thyroid enlargement, no tenderness.  LUNGS: Normal breath sounds bilaterally, no wheezing, rales,rhonchi or crepitation. No use of  accessory muscles of respiration.  CARDIOVASCULAR: S1, S2 normal. No murmurs, rubs, or gallops.  ABDOMEN: Soft, non-tender, non-distended. Bowel sounds present. No organomegaly or mass.  EXTREMITIES: No pedal edema, cyanosis, or clubbing.  NEUROLOGIC: Cranial nerves II through XII are intact. Muscle strength 5/5 in all extremities. Sensation intact. Gait not checked.  PSYCHIATRIC: The patient is alert and oriented x 3.  SKIN: No obvious rash, lesion, or ulcer.   DATA REVIEW:   CBC   Recent Labs Lab 07/29/16 0344  WBC 9.6  HGB 13.0  HCT 38.1  PLT 304    Chemistries   Recent Labs Lab 07/28/16 1124 07/29/16 0344  NA 139 141  K 3.8 3.3*  CL 104 108  CO2 28 27  GLUCOSE 136* 86  BUN 15 13  CREATININE 0.80 0.72  CALCIUM 9.5 8.8*  AST 25  --   ALT 25  --   ALKPHOS  94  --   BILITOT 0.5  --     Microbiology Results   No results found for this or any previous visit (from the past 240 hour(s)).  RADIOLOGY:  No results found.   Management plans discussed with the patient, family and they are in agreement.  CODE STATUS:     Code Status Orders        Start     Ordered   07/28/16 1428  Full code  Continuous     07/28/16 1428    Code Status History    Date Active Date Inactive Code Status Order ID Comments User Context   07/22/2016 11:48 PM 07/24/2016  5:59 PM Full Code [id number]  Oralia Manisavid Willis, MD Inpatient   01/16/2016  9:33 PM 01/20/2016  3:07 PM Full Code [id number]  Houston SirenVivek J Sainani, MD ED   08/14/2015  5:33 PM 08/17/2015  2:44 PM Full Code [id number]  Adrian SaranSital Mody, MD Inpatient   02/15/2015  8:31 PM 02/18/2015 10:31 PM Full Code [id number]  Katha HammingSnehalatha Konidena, MD ED      TOTAL TIME TAKING CARE OF THIS PATIENT: 40 minutes.    Anella Nakata M.D on 07/30/2016 at 12:57 PM  Between 7am to 6pm - Pager - [phone] After 6pm go to [email or web address] - Social research officer, governmentpassword [email or web address] [email or web address]  [facility]  Office  [phone]  CC: Primary care physician; No PCP Per Patient

## 2016-07-30 NOTE — Progress Notes (Signed)
Pt has had normal gastric empyting study in the past. Will not do another study.  Clear liquid started.  Added boost breeze

## 2016-07-30 NOTE — Progress Notes (Signed)
Pt is discharged home. Discharge papers given and explained to pt. Pt verbalized understanding. Meds reviewed with pt. Awaiting transportation.

## 2016-07-30 NOTE — Progress Notes (Signed)
[facility] - [place] at [facility]   PATIENT NAME: Lynn Larson    MR#:  [id number]  DATE OF BIRTH:  12-16-1965  SUBJECTIVE:  Abdomina pain acute on chronic Numerous admissions for same reason Per RN vomitted 400 cc durign daytime yday and 100 cc nite(not documented by RN) REVIEW OF SYSTEMS:   Review of Systems  Constitutional: Negative for chills, fever and weight loss.  HENT: Negative for ear discharge, ear pain and nosebleeds.   Eyes: Negative for blurred vision, pain and discharge.  Respiratory: Negative for sputum production, shortness of breath, wheezing and stridor.   Cardiovascular: Negative for chest pain, palpitations, orthopnea and PND.  Gastrointestinal: Positive for abdominal pain, nausea and vomiting. Negative for diarrhea.  Genitourinary: Negative for frequency and urgency.  Musculoskeletal: Negative for back pain and joint pain.  Neurological: Positive for weakness. Negative for sensory change, speech change and focal weakness.  Psychiatric/Behavioral: Negative for depression and hallucinations. The patient is not nervous/anxious.    Tolerating Diet: npoTolerating PT: not needed  DRUG ALLERGIES:   Allergies  Allergen Reactions  . Chantix [Varenicline]     Suicidal thoughts  . Morphine And Related Other (See Comments)    Reaction:  Redness of face     VITALS:  Blood pressure 134/69, pulse (!) 57, temperature 97.6 F (36.4 C), resp. rate 20, height 5\' 3"  (1.6 m), weight 72.7 kg (160 lb 3.2 oz), SpO2 96 %.  PHYSICAL EXAMINATION:   Physical Exam  GENERAL:  51 y.o.-year-old patient lying in the bed with no acute distress.  EYES: Pupils equal, round, reactive to light and accommodation. No scleral icterus. Extraocular muscles intact.  HEENT: Head atraumatic, normocephalic. Oropharynx and nasopharynx clear.  NECK:  Supple, no jugular venous distention. No thyroid enlargement, no tenderness.  LUNGS: Normal breath sounds bilaterally, no  wheezing, rales, rhonchi. No use of accessory muscles of respiration.  CARDIOVASCULAR: S1, S2 normal. No murmurs, rubs, or gallops.  ABDOMEN: Soft, nontender, nondistended. Bowel sounds present. No organomegaly or mass.  EXTREMITIES: No cyanosis, clubbing or edema b/l.    NEUROLOGIC: Cranial nerves II through XII are intact. No focal Motor or sensory deficits b/l.   PSYCHIATRIC:  patient is alert and oriented x 3.  SKIN: No obvious rash, lesion, or ulcer.   LABORATORY PANEL:  CBC  Recent Labs Lab 07/29/16 0344  WBC 9.6  HGB 13.0  HCT 38.1  PLT 304    Chemistries   Recent Labs Lab 07/28/16 1124 07/29/16 0344  NA 139 141  K 3.8 3.3*  CL 104 108  CO2 28 27  GLUCOSE 136* 86  BUN 15 13  CREATININE 0.80 0.72  CALCIUM 9.5 8.8*  AST 25  --   ALT 25  --   ALKPHOS 94  --   BILITOT 0.5  --    Cardiac Enzymes  Recent Labs Lab 07/27/16 1029  TROPONINI <0.03   RADIOLOGY:  No results found. ASSESSMENT AND PLAN:  Lynn Larson  is a 51 y.o. female with a known history of Numerous visits in the past and this week with abdominal pain nausea vomiting. The patient was in the ER on 07/21/2016, 07/22/2016, yesterday and again today. The patient with persistent nausea vomiting and epigastric pain.  1. Persistent nausea vomiting and epigastric abdominal pain. Multiple ER visits and admissions for same reason. ?cyclical ?gastroparesis - Standing dose Zofran around the clock.  - When necessary IV morphine IV Reglan. IV Protonix. Oral Carafate if able to tolerate. -  Gentle IV fluid hydration -Pt has had normal CT abdomen She has had GI w/u done in the past that showed acute gastritis -Gastric emptying today  2. Accelerated hypertension secondary to pain. Try to settle down vomiting. Hold off on antihypertensive medications at this point. -bp stable  3. Leukocytosis secondary to vomiting  4. Substance abuse Tobacco and marijuana Advised to stop  5. History of asthma. Respiratory  status stable  Case discussed with Care Management/Social Worker. Management plans discussed with the patient, family and they are in agreement.  CODE STATUS: full  DVT Prophylaxis: lovenox  TOTAL TIME TAKING CARE OF THIS PATIENT: .  >50% time spent on counselling and coordination of care  POSSIBLE D/C IN1 DAYS, DEPENDING ON CLINICAL CONDITION.  Note: This dictation was prepared with Dragon dictation along with smaller phrase technology. Any transcriptional errors that result from this process are unintentional.  Hosam Mcfetridge M.D on 07/30/2016 at 8:36 AM  Between 7am to 6pm - Pager - [phone]  After 6pm go to [email or web address] - Social research officer, government  [facility]  Office  [phone]  CC: Primary care physician; No PCP Per Patient

## 2016-08-01 ENCOUNTER — Inpatient Hospital Stay
Admission: EM | Admit: 2016-08-01 | Discharge: 2016-08-02 | DRG: 392 | Disposition: A | Discharge status: Home / Self Care | Payer: Self-pay | Attending: Specialist | Admitting: Specialist

## 2016-08-01 DIAGNOSIS — Z79899 Other long term (current) drug therapy: Secondary | ICD-10-CM

## 2016-08-01 DIAGNOSIS — K529 Noninfective gastroenteritis and colitis, unspecified: Secondary | ICD-10-CM | POA: Diagnosis present

## 2016-08-01 DIAGNOSIS — R112 Nausea with vomiting, unspecified: Secondary | ICD-10-CM

## 2016-08-01 DIAGNOSIS — Z885 Allergy status to narcotic agent status: Secondary | ICD-10-CM

## 2016-08-01 DIAGNOSIS — A0811 Acute gastroenteropathy due to Norwalk agent: Principal | ICD-10-CM | POA: Diagnosis present

## 2016-08-01 DIAGNOSIS — Z87891 Personal history of nicotine dependence: Secondary | ICD-10-CM

## 2016-08-01 DIAGNOSIS — K3184 Gastroparesis: Secondary | ICD-10-CM | POA: Diagnosis present

## 2016-08-01 DIAGNOSIS — Z888 Allergy status to other drugs, medicaments and biological substances status: Secondary | ICD-10-CM

## 2016-08-01 DIAGNOSIS — J45909 Unspecified asthma, uncomplicated: Secondary | ICD-10-CM | POA: Diagnosis present

## 2016-08-01 DIAGNOSIS — I10 Essential (primary) hypertension: Secondary | ICD-10-CM | POA: Diagnosis present

## 2016-08-01 DIAGNOSIS — E1143 Type 2 diabetes mellitus with diabetic autonomic (poly)neuropathy: Secondary | ICD-10-CM | POA: Diagnosis present

## 2016-08-01 DIAGNOSIS — K219 Gastro-esophageal reflux disease without esophagitis: Secondary | ICD-10-CM | POA: Diagnosis present

## 2016-08-01 LAB — COMPREHENSIVE METABOLIC PANEL
ALK PHOS: 92 U/L (ref 38–126)
ALT: 20 U/L (ref 14–54)
ANION GAP: 10 (ref 5–15)
AST: 24 U/L (ref 15–41)
Albumin: 4.6 g/dL (ref 3.5–5.0)
BILIRUBIN TOTAL: 0.5 mg/dL (ref 0.3–1.2)
BUN: 16 mg/dL (ref 6–20)
CALCIUM: 9.3 mg/dL (ref 8.9–10.3)
CO2: 30 mmol/L (ref 22–32)
Chloride: 100 mmol/L — ABNORMAL LOW (ref 101–111)
Creatinine, Ser: 0.78 mg/dL (ref 0.44–1.00)
GFR calc Af Amer: >60 mL/min (ref >60)
GLUCOSE: 125 mg/dL — AB (ref 65–99)
POTASSIUM: 3.5 mmol/L (ref 3.5–5.1)
Sodium: 140 mmol/L (ref 135–145)
TOTAL PROTEIN: 8.1 g/dL (ref 6.5–8.1)

## 2016-08-01 LAB — URINALYSIS, COMPLETE (UACMP) WITH MICROSCOPIC
Bilirubin Urine: NEGATIVE
GLUCOSE, UA: NEGATIVE mg/dL
Ketones, ur: NEGATIVE mg/dL
Leukocytes, UA: NEGATIVE
NITRITE: NEGATIVE
Protein, ur: NEGATIVE mg/dL
SPECIFIC GRAVITY, URINE: 1.011 (ref 1.005–1.030)
pH: 7 (ref 5.0–8.0)

## 2016-08-01 LAB — GASTROINTESTINAL PANEL BY PCR, STOOL (REPLACES STOOL CULTURE)

## 2016-08-01 LAB — CBC
HEMATOCRIT: 44.8 % (ref 35.0–47.0)
HEMOGLOBIN: 15.8 g/dL (ref 12.0–16.0)
MCH: 30.1 pg (ref 26.0–34.0)
MCHC: 35.2 g/dL (ref 32.0–36.0)
MCV: 85.6 fL (ref 80.0–100.0)
Platelets: 324 10*3/uL (ref 150–440)
RBC: 5.23 MIL/uL — ABNORMAL HIGH (ref 3.80–5.20)
RDW: 14.5 % (ref 11.5–14.5)
WBC: 12.5 10*3/uL — AB (ref 3.6–11.0)

## 2016-08-01 LAB — C DIFFICILE QUICK SCREEN W PCR REFLEX
C DIFFICLE (CDIFF) ANTIGEN: NEGATIVE
C Diff interpretation: NOT DETECTED
C Diff toxin: NEGATIVE

## 2016-08-01 LAB — LIPASE, BLOOD: Lipase: 30 U/L (ref 11–51)

## 2016-08-01 MED ORDER — ONDANSETRON HCL 4 MG PO TABS
4.0000 mg | ORAL_TABLET | Freq: Four times a day (QID) | ORAL | Status: DC | PRN
  Administered 2016-08-02: 4 mg via ORAL
  Filled 2016-08-01: qty 1

## 2016-08-01 MED ORDER — BOOST / RESOURCE BREEZE PO LIQD: 1.0000 | Freq: Three times a day (TID) | ORAL | Status: DC

## 2016-08-01 MED ORDER — HYDROMORPHONE HCL 1 MG/ML IJ SOLN
1.0000 mg | Freq: Once | INTRAMUSCULAR | Status: AC
  Administered 2016-08-01: 1 mg via INTRAVENOUS
  Filled 2016-08-01: qty 1

## 2016-08-01 MED ORDER — PROMETHAZINE HCL 25 MG/ML IJ SOLN
25.0000 mg | Freq: Once | INTRAMUSCULAR | Status: AC
  Administered 2016-08-01: 25 mg via INTRAVENOUS
  Filled 2016-08-01: qty 1

## 2016-08-01 MED ORDER — KETOROLAC TROMETHAMINE 30 MG/ML IJ SOLN
30.0000 mg | Freq: Four times a day (QID) | INTRAMUSCULAR | Status: DC | PRN
  Administered 2016-08-01 (×2): 30 mg via INTRAVENOUS
  Filled 2016-08-01 (×2): qty 1

## 2016-08-01 MED ORDER — LOPERAMIDE HCL 2 MG PO CAPS
2.0000 mg | ORAL_CAPSULE | Freq: Four times a day (QID) | ORAL | Status: DC | PRN
  Administered 2016-08-01 (×2): 2 mg via ORAL
  Filled 2016-08-01 (×2): qty 1

## 2016-08-01 MED ORDER — PANTOPRAZOLE SODIUM 40 MG PO TBEC
40.0000 mg | DELAYED_RELEASE_TABLET | Freq: Two times a day (BID) | ORAL | Status: DC
  Administered 2016-08-01 – 2016-08-02 (×3): 40 mg via ORAL
  Filled 2016-08-01 (×3): qty 1

## 2016-08-01 MED ORDER — LORAZEPAM 2 MG/ML IJ SOLN
1.0000 mg | Freq: Once | INTRAMUSCULAR | Status: AC
  Administered 2016-08-01: 1 mg via INTRAVENOUS
  Filled 2016-08-01: qty 1

## 2016-08-01 MED ORDER — ACETAMINOPHEN 325 MG PO TABS: 650.0000 mg | ORAL_TABLET | Freq: Four times a day (QID) | ORAL | Status: DC | PRN

## 2016-08-01 MED ORDER — OXYCODONE HCL 5 MG PO TABS
5.0000 mg | ORAL_TABLET | ORAL | Status: DC | PRN
  Administered 2016-08-01 – 2016-08-02 (×3): 5 mg via ORAL
  Filled 2016-08-01 (×3): qty 1

## 2016-08-01 MED ORDER — HALOPERIDOL LACTATE 5 MG/ML IJ SOLN
5.0000 mg | Freq: Once | INTRAMUSCULAR | Status: AC
  Administered 2016-08-01: 5 mg via INTRAVENOUS

## 2016-08-01 MED ORDER — ONDANSETRON HCL 4 MG/2ML IJ SOLN
4.0000 mg | Freq: Four times a day (QID) | INTRAMUSCULAR | Status: DC | PRN
  Administered 2016-08-01 (×2): 4 mg via INTRAVENOUS
  Filled 2016-08-01 (×2): qty 2

## 2016-08-01 MED ORDER — HALOPERIDOL LACTATE 5 MG/ML IJ SOLN
INTRAMUSCULAR | Status: AC
  Filled 2016-08-01: qty 1

## 2016-08-01 MED ORDER — METOCLOPRAMIDE HCL 5 MG/ML IJ SOLN
10.0000 mg | Freq: Three times a day (TID) | INTRAMUSCULAR | Status: DC
  Administered 2016-08-01 – 2016-08-02 (×4): 10 mg via INTRAVENOUS
  Filled 2016-08-01 (×4): qty 2

## 2016-08-01 MED ORDER — ENOXAPARIN SODIUM 40 MG/0.4ML ~~LOC~~ SOLN
40.0000 mg | SUBCUTANEOUS | Status: DC
  Administered 2016-08-01: 40 mg via SUBCUTANEOUS
  Filled 2016-08-01: qty 0.4

## 2016-08-01 MED ORDER — ACETAMINOPHEN 650 MG RE SUPP: 650.0000 mg | Freq: Four times a day (QID) | RECTAL | Status: DC | PRN

## 2016-08-01 MED ORDER — ALUM & MAG HYDROXIDE-SIMETH 200-200-20 MG/5ML PO SUSP
15.0000 mL | Freq: Two times a day (BID) | ORAL | Status: DC
  Administered 2016-08-01 – 2016-08-02 (×3): 30 mL via ORAL
  Filled 2016-08-01 (×3): qty 30

## 2016-08-01 MED ORDER — NICOTINE 14 MG/24HR TD PT24
14.0000 mg | MEDICATED_PATCH | Freq: Every day | TRANSDERMAL | Status: DC
  Administered 2016-08-01 – 2016-08-02 (×2): 14 mg via TRANSDERMAL
  Filled 2016-08-01 (×2): qty 1

## 2016-08-01 MED ORDER — SODIUM CHLORIDE 0.9 % IV SOLN
Freq: Once | INTRAVENOUS | Status: AC
  Administered 2016-08-01: 08:00:00 via INTRAVENOUS

## 2016-08-01 MED ORDER — SODIUM CHLORIDE 0.9 % IV SOLN
INTRAVENOUS | Status: DC
  Administered 2016-08-01: 1000 mL via INTRAVENOUS
  Administered 2016-08-02: 02:00:00 via INTRAVENOUS

## 2016-08-01 MED ORDER — SUCRALFATE 1 G PO TABS
1.0000 g | ORAL_TABLET | Freq: Three times a day (TID) | ORAL | Status: DC
  Administered 2016-08-01 – 2016-08-02 (×5): 1 g via ORAL
  Filled 2016-08-01 (×5): qty 1

## 2016-08-01 NOTE — ED Provider Notes (Signed)
[facility] Emergency Department Provider Note        Time seen: ----------------------------------------- 7:33 AM on 08/01/2016 -----------------------------------------    I have reviewed the triage vital signs and the nursing notes.   HISTORY  Chief Complaint Emesis and Abdominal Pain    HPI Lynn Larson is a 51 y.o. female who presents to ER for chronic abdominal pain and vomiting. Patient reports she's been here several times recently for same. She has been told she has not had a definitive diagnosis. She was discharged 2 days ago for similar and reports she's had profuse watery diarrhea since discharge. She arrives very tearful and in severe pain. Pain is 10 out of 10 with vomiting and diarrhea.   Past Medical History:  Diagnosis Date  . Asthma   . Hypertension     Patient Active Problem List   Diagnosis Date Noted  . Nausea and vomiting 07/28/2016  . HTN (hypertension) 07/22/2016  . Asthma 07/22/2016  . Epigastric abdominal pain   . Colitis 01/16/2016  . Hematemesis 08/14/2015  . Nausea & vomiting 02/15/2015    Past Surgical History:  Procedure Laterality Date  . CHOLECYSTECTOMY    . ESOPHAGOGASTRODUODENOSCOPY (EGD) WITH PROPOFOL [name] 08/15/2015   Procedure: ESOPHAGOGASTRODUODENOSCOPY (EGD) WITH PROPOFOL;  Surgeon: Christena Deem, MD;  Location: [facility] ENDOSCOPY;  Service: Endoscopy;  Laterality: [name];    Allergies Chantix [varenicline] and Morphine and related  Social History Social History  Substance Use Topics  . Smoking status: Former Smoker    Packs/day: 0.50    Years: 30.00    Types: Cigarettes  . Smokeless tobacco: Never Used  . Alcohol use No    Review of Systems Constitutional: Negative for fever. Cardiovascular: Negative for chest pain. Respiratory: Negative for shortness of breath. Gastrointestinal: Positive for abdominal pain, vomiting and diarrhea Genitourinary: Negative for dysuria. Musculoskeletal:  Negative for back pain. Skin: Negative for rash. Neurological: Negative for headaches, focal weakness or numbness.  10-point ROS otherwise negative.  ____________________________________________   PHYSICAL EXAM:  VITAL SIGNS: ED Triage Vitals  Enc Vitals Group     BP 08/01/16 0523 (!) 153/99     Pulse Rate 08/01/16 0523 100     Resp 08/01/16 0523 18     Temp 08/01/16 0523 98.3 F (36.8 C)     Temp Source 08/01/16 0523 Oral     SpO2 08/01/16 0523 95 %     Weight 08/01/16 0520 160 lb (72.6 kg)     Height 08/01/16 0520 5\' 3"  (1.6 m)     Head Circumference --      Peak Flow --      Pain Score 08/01/16 0520 10     Pain Loc --      Pain Edu? --      Excl. in GC? --     Constitutional: Alert and oriented. Anxious, mild distress Eyes: Conjunctivae are normal. PERRL. Normal extraocular movements. ENT   Head: Normocephalic and atraumatic.   Nose: No congestion/rhinnorhea.   Mouth/Throat: Mucous membranes are moist.   Neck: No stridor. Cardiovascular: Normal rate, regular rhythm. No murmurs, rubs, or gallops. Respiratory: Normal respiratory effort without tachypnea nor retractions. Breath sounds are clear and equal bilaterally. No wheezes/rales/rhonchi. Gastrointestinal: Diffuse tenderness, nonfocal. Hypoactive bowel sounds. Musculoskeletal: Nontender with normal range of motion in all extremities. No lower extremity tenderness nor edema. Neurologic:  Normal speech and language. No gross focal neurologic deficits are appreciated.  Skin:  Skin is warm, dry and intact. No rash noted.  Psychiatric: Mood and affect are normal. Speech and behavior are normal.  ____________________________________________  ED COURSE:  Pertinent labs & imaging results that were available during my care of the patient were reviewed by me and considered in my medical decision making (see chart for details). Patient presents to ER with severe abdominal pain with vomiting and diarrhea. We will  assess with labs and imaging if necessary. Clinical Course as of Aug 01 1008  Fri Aug 01, 2016  [id number] Symptoms were out of control after IV Ativan, Phenergan and Dilaudid. She has been given IV Haldol with significant improvement.  [JW]    Clinical Course User Index [JW] Emily FilbertJonathan E Williams, MD   Procedures ____________________________________________   LABS (pertinent positives/negatives)  Labs Reviewed  GASTROINTESTINAL PANEL BY PCR, STOOL (REPLACES STOOL CULTURE) - Abnormal; Notable for the following:       Result Value   Norovirus GI/GII DETECTED (*)    All other components within normal limits  COMPREHENSIVE METABOLIC PANEL - Abnormal; Notable for the following:    Chloride 100 (*)    Glucose, Bld 125 (*)    All other components within normal limits  CBC - Abnormal; Notable for the following:    WBC 12.5 (*)    RBC 5.23 (*)    All other components within normal limits  URINALYSIS, COMPLETE (UACMP) WITH MICROSCOPIC - Abnormal; Notable for the following:    Color, Urine YELLOW (*)    APPearance CLEAR (*)    Hgb urine dipstick MODERATE (*)    Bacteria, UA RARE (*)    Squamous Epithelial / LPF 0-5 (*)    All other components within normal limits  C DIFFICILE QUICK SCREEN W PCR REFLEX  LIPASE, BLOOD   ____________________________________________  FINAL ASSESSMENT AND PLAN  Norovirus, intractable vomiting, abdominal pain  Plan: Patient with labs as dictated above. Essentially patient has had intractable vomiting and pain due to Norovirus. She was recently discharged from the hospital for similar complaints. I have been unable to alleviate her symptoms. I will discuss with the hospitalist for admission.   Emily FilbertWilliams, Jonathan E, MD   Note: This note was generated in part or whole with voice recognition software. Voice recognition is usually quite accurate but there are transcription errors that can and very often do occur. I apologize for any typographical errors that  were not detected and corrected.     Emily FilbertJonathan E Williams, MD 08/01/16 1011

## 2016-08-01 NOTE — ED Notes (Signed)
Pt sleeping. 

## 2016-08-01 NOTE — Progress Notes (Signed)
Patient admitted today c/o nausea and vomiting. No vomiting noted although patient c/o of being nauseated. IV fluids infusing as ordered. No acute distress noted.

## 2016-08-01 NOTE — H&P (Signed)
[facility] - [place] at [facility]   PATIENT NAME: Lynn Larson    MR#:  [id number]  DATE OF BIRTH:  [birth year]  DATE OF ADMISSION:  08/01/2016  PRIMARY CARE PHYSICIAN: No PCP Per Patient   REQUESTING/REFERRING PHYSICIAN: Dr. Daryel November  CHIEF COMPLAINT:   Chief Complaint  Patient presents with  . Emesis  . Abdominal Pain    HISTORY OF PRESENT ILLNESS:  Lynn Larson  is a 51 y.o. female with a known history of Asthma, hypertension, diet-controlled diabetes, cyclical nausea vomiting, gastroparesis presents to Hospital secondary to worsening diarrhea, abdominal pain, nausea and vomiting. She was recently here last week for similar complaints and was discharged home on oral anti-emetics. She says her abdominal pain and diarrhea started after discharge 2 days ago. Complains of chills at home, no fevers here in the hospital. Stool for C. difficile is negative and positive for noro virus. She is being admitted for acute gastroenteritis. Labs are otherwise normal.  PAST MEDICAL HISTORY:   Past Medical History:  Diagnosis Date  . Asthma   . Diabetes (HCC)   . Hypertension     PAST SURGICAL HISTORY:   Past Surgical History:  Procedure Laterality Date  . CHOLECYSTECTOMY    . ESOPHAGOGASTRODUODENOSCOPY (EGD) WITH PROPOFOL [name] 08/15/2015   Procedure: ESOPHAGOGASTRODUODENOSCOPY (EGD) WITH PROPOFOL;  Surgeon: Christena Deem, MD;  Location: [facility] ENDOSCOPY;  Service: Endoscopy;  Laterality: [name];    SOCIAL HISTORY:   Social History  Substance Use Topics  . Smoking status: Former Smoker    Packs/day: 0.50    Years: 30.00    Types: Cigarettes  . Smokeless tobacco: Never Used  . Alcohol use No    FAMILY HISTORY:   Family History  Problem Relation Age of Onset  . Breast cancer Maternal Grandmother   . Ovarian cancer Maternal Grandmother     DRUG ALLERGIES:   Allergies  Allergen Reactions  . Chantix [Varenicline]     Suicidal thoughts  .  Morphine And Related Other (See Comments)    Reaction:  Redness of face     REVIEW OF SYSTEMS:   Review of Systems  Constitutional: Positive for malaise/fatigue. Negative for chills, fever and weight loss.  HENT: Negative for ear discharge, hearing loss and nosebleeds.   Eyes: Negative for blurred vision and double vision.  Respiratory: Negative for cough, hemoptysis, shortness of breath and wheezing.   Cardiovascular: Negative for chest pain, palpitations, orthopnea and leg swelling.  Gastrointestinal: Positive for abdominal pain, diarrhea, nausea and vomiting. Negative for constipation, heartburn and melena.  Genitourinary: Negative for dysuria, frequency, hematuria and urgency.  Musculoskeletal: Positive for myalgias. Negative for neck pain.  Skin: Negative for rash.  Neurological: Negative for dizziness, tingling, sensory change, speech change, focal weakness and headaches.  Endo/Heme/Allergies: Does not bruise/bleed easily.  Psychiatric/Behavioral: Negative for depression.    MEDICATIONS AT HOME:   Prior to Admission medications   Medication Sig Start Date End Date Taking? Authorizing Provider  acetaminophen (TYLENOL) 325 MG tablet Take 2 tablets (650 mg total) by mouth every 6 (six) hours as needed for mild pain (or Fever >/= 101). 07/24/16  Yes Lynn Lab, MD  alum & mag hydroxide-simeth (MAALOX/MYLANTA) 200-200-20 MG/5ML suspension Take 15-30 mLs by mouth 2 (two) times daily. 2 tablespoons in the morning and 1 tablespoon in the evening.   Yes Historical Provider, MD  metoCLOPramide (REGLAN) 5 MG tablet Take 1 tablet (5 mg total) by mouth 3 (three) times daily before meals. 01/20/16  Yes Lynn Baasadhika Kyliegh Jester, MD  ondansetron (ZOFRAN) 4 MG tablet Take 1 tablet (4 mg total) by mouth every 8 (eight) hours as needed for nausea or vomiting. 07/24/16 07/24/17 Yes Lynn LabAruna Gouru, MD  pantoprazole (PROTONIX) 40 MG tablet Take 1 tablet (40 mg total) by mouth 2 (two) times daily. 01/20/16  Yes Lynn Baasadhika  Matilde Pottenger, MD  promethazine (PHENERGAN) 25 MG tablet Take 1 tablet (25 mg total) by mouth every 6 (six) hours as needed for nausea or vomiting. 01/20/16  Yes Lynn Baasadhika Lauris Keepers, MD  sucralfate (CARAFATE) 1 g tablet Take 1 tablet (1 g total) by mouth 4 (four) times daily -  with meals and at bedtime. 07/24/16  Yes Lynn LabAruna Gouru, MD  feeding supplement (BOOST / RESOURCE BREEZE) LIQD Take 1 Container by mouth 3 (three) times daily between meals. Patient not taking: Reported on 08/01/2016 07/30/16   Lynn FinnerSona Patel, MD  nicotine (NICODERM CQ - DOSED IN MG/24 HR) 7 mg/24hr patch Place 1 patch (7 mg total) onto the skin daily. Patient not taking: Reported on 07/28/2016 07/25/16   Lynn LabAruna Gouru, MD      VITAL SIGNS:  Blood pressure 132/76, pulse 67, temperature 97.4 F (36.3 C), temperature source Oral, resp. rate 18, height 5\' 3"  (1.6 m), weight 72.6 kg (160 lb), SpO2 93 %.  PHYSICAL EXAMINATION:   Physical Exam  GENERAL:  51 y.o.-year-old patient lying in the bed with no acute distress.  EYES: Pupils equal, round, reactive to light and accommodation. No scleral icterus. Extraocular muscles intact.  HEENT: Head atraumatic, normocephalic. Oropharynx and nasopharynx clear.  NECK:  Supple, no jugular venous distention. No thyroid enlargement, no tenderness.  LUNGS: Normal breath sounds bilaterally, no wheezing, rales,rhonchi or crepitation. No use of accessory muscles of respiration.  CARDIOVASCULAR: S1, S2 normal. No murmurs, rubs, or gallops.  ABDOMEN: Soft, Diffusely tender all over with voluntary guarding, nondistended. Bowel sounds present. No organomegaly or mass.  EXTREMITIES: No pedal edema, cyanosis, or clubbing.  NEUROLOGIC: Cranial nerves II through XII are intact. Muscle strength 5/5 in all extremities. Sensation intact. Gait not checked.  PSYCHIATRIC: The patient is alert and oriented x 3.  SKIN: No obvious rash, lesion, or ulcer.   LABORATORY PANEL:   CBC  Recent Labs Lab 08/01/16 0527  WBC  12.5*  HGB 15.8  HCT 44.8  PLT 324   ------------------------------------------------------------------------------------------------------------------  Chemistries   Recent Labs Lab 08/01/16 0527  NA 140  K 3.5  CL 100*  CO2 30  GLUCOSE 125*  BUN 16  CREATININE 0.78  CALCIUM 9.3  AST 24  ALT 20  ALKPHOS 92  BILITOT 0.5   ------------------------------------------------------------------------------------------------------------------  Cardiac Enzymes  Recent Labs Lab 07/27/16 1029  TROPONINI <0.03   ------------------------------------------------------------------------------------------------------------------  RADIOLOGY:  No results found.  EKG:   Orders placed or performed during the hospital encounter of 07/27/16  . EKG 12-Lead  . EKG 12-Lead  . ED EKG  . ED EKG  . EKG    IMPRESSION AND PLAN:   Lynn Larson  is a 51 y.o. female with a known history of Asthma, hypertension, diet-controlled diabetes, cyclical nausea vomiting, gastroparesis presents to Hospital secondary to worsening diarrhea, abdominal pain, nausea and vomiting.  #1 acute viral gastroenteritis-secondary to no rotavirus. -IV fluids, some dramatic treatment recommended. C. difficile is negative. -Imodium for diarrhea. Zofran and Reglan for nausea and vomiting. -Started on clear liquid diet and will advance as tolerated.  #2 GERD-continue sucralfate and PPI  #3 gastroparesis-currently on Reglan.  #4 tobacco use disorder-on nicotine  patch  #5 DVT prophylaxis-Lovenox   All the records are reviewed and case discussed with ED provider. Management plans discussed with the patient, family and they are in agreement.  CODE STATUS: Full code  TOTAL TIME TAKING CARE OF THIS PATIENT: 50 minutes.    Storie Heffern M.D on 08/01/2016 at 11:02 AM  Between 7am to 6pm - Pager - [phone]  After 6pm go to [email or web address] - Social research officer, government  [facility]  Office   [phone]  CC: Primary care physician; No PCP Per Patient

## 2016-08-01 NOTE — ED Notes (Signed)
Report from ParkerValerie, CaliforniaRN. Care assumed by this RN.

## 2016-08-01 NOTE — ED Notes (Signed)
Report to Karen, RN

## 2016-08-01 NOTE — ED Notes (Signed)
Pt c/o nausea and pain, dr Mayford Knifewilliams notified.

## 2016-08-01 NOTE — Progress Notes (Signed)
Initial Nutrition Assessment  DOCUMENTATION CODES:   Not applicable  INTERVENTION:  Advance diet per MD.   Provide Boost Breeze po TID, each supplement provides 250 kcal and 9 grams of protein.  NUTRITION DIAGNOSIS:   Inadequate oral intake related to poor appetite, nausea, vomiting, other (see comment) (abdominal pain) as evidenced by per patient/family report, percent weight loss.  GOAL:   Patient will meet greater than or equal to 90% of their needs  MONITOR:   Supplement acceptance, Diet advancement, Labs, I & O's, Weight trends  REASON FOR ASSESSMENT:   Malnutrition Screening Tool    ASSESSMENT:   51 year old female with PMHx of HTN who presents with persistent nausea, vomiting, and epigastric pain. Patient recently admitted earlier this week and discharged home on oral anti-emetics. Now admitted for acute gastroenteritis.   Patient known to this RD from admission earlier this week. Patient has had poor appetite for approximately 2 weeks in setting of N/V and abdominal pain. Patient now with diarrhea that started after recent discharge. She is still eating 3 meals daily, just reports having smaller portions.   UBW 174 lbs. Per report patient has lost 14 lbs (8% body weight) over 1 month, which is significant for time frame.   Medications reviewed and include: Reglan, pantoprazole, Carafate 1 gram TID with meals, NS @ 75 ml/hr.  Labs reviewed: Chloride 100, Glucose 125.   Nutrition-Focused physical exam completed. Findings are no fat depletion, no muscle depletion, and no edema.   Diet Order:  Diet clear liquid Room service appropriate? Yes; Fluid consistency: Thin  Skin:  Reviewed, no issues  Last BM:  PTA  Height:   Ht Readings from Last 1 Encounters:  08/01/16 5\' 3"  (1.6 m)    Weight:   Wt Readings from Last 1 Encounters:  08/01/16 160 lb (72.6 kg)    Ideal Body Weight:  52.3 kg  BMI:  Body mass index is 28.34 kg/m.  Estimated Nutritional Needs:    Kcal:  1575-1710 (MSJ x 1.2-1.3)  Protein:  75-90 grams (1-1.2 grams/kg)  Fluid:  1.8-2.1 L/day (25-30 ml/kg)  EDUCATION NEEDS:   No education needs identified at this time  Helane RimaLeanne Heyli Min, MS, RD, LDN Pager: [phone] After Hours Pager: [phone]

## 2016-08-01 NOTE — ED Triage Notes (Addendum)
Pt presents to ED with c/o chronic epigastric abdominal pain and emesis. Pt reports being seen here several times recently for same, but denies being told a definitive d/x. Pt reports being d/c'd Wednesday and states her s/x's started immediately after getting home. Pt is tearful in Triage, states that no one takes her seriously "because my drug tests came back positive for marijuana". Pt is A&O, in NAD speaking clearly and in complete sentences, with respirations even, regular and unlabored. Pt reports taking "nausea pill" at home PTA without relief.

## 2016-08-02 LAB — BASIC METABOLIC PANEL
ANION GAP: 6 (ref 5–15)
BUN: 8 mg/dL (ref 6–20)
CALCIUM: 8.6 mg/dL — AB (ref 8.9–10.3)
CO2: 26 mmol/L (ref 22–32)
Chloride: 109 mmol/L (ref 101–111)
Creatinine, Ser: 0.62 mg/dL (ref 0.44–1.00)
GFR calc non Af Amer: >60 mL/min (ref >60)
Glucose, Bld: 97 mg/dL (ref 65–99)
Potassium: 3.7 mmol/L (ref 3.5–5.1)
SODIUM: 141 mmol/L (ref 135–145)

## 2016-08-02 LAB — CBC
HCT: 40 % (ref 35.0–47.0)
HEMOGLOBIN: 13.5 g/dL (ref 12.0–16.0)
MCH: 29.3 pg (ref 26.0–34.0)
MCHC: 33.6 g/dL (ref 32.0–36.0)
MCV: 87 fL (ref 80.0–100.0)
PLATELETS: 280 10*3/uL (ref 150–440)
RBC: 4.6 MIL/uL (ref 3.80–5.20)
RDW: 14.6 % — ABNORMAL HIGH (ref 11.5–14.5)
WBC: 7.3 10*3/uL (ref 3.6–11.0)

## 2016-08-02 MED ORDER — ENSURE ENLIVE PO LIQD
237.0000 mL | Freq: Three times a day (TID) | ORAL | Status: DC
  Administered 2016-08-02 (×2): 237 mL via ORAL

## 2016-08-02 MED ORDER — LOPERAMIDE HCL 2 MG PO TABS: 2.0000 mg | ORAL_TABLET | Freq: Four times a day (QID) | ORAL | 0 refills | Status: DC | PRN

## 2016-08-02 NOTE — Progress Notes (Signed)
Per Dr. Cherlynn KaiserSainani okay to change ensure enlive to boost as diet has been upgraded

## 2016-08-02 NOTE — Progress Notes (Signed)
Per Dr. Cherlynn KaiserSainani okay to place order for full liquid diet and advance as tolerated

## 2016-08-02 NOTE — Progress Notes (Signed)
[facility]    Ander GasterJudy Larson was admitted to the Hospital on 08/01/2016 and Discharged  08/02/2016 and should be excused from work/school   for 5  days starting 08/01/2016 , may return to work/school without any restrictions.  Call Hilda LiasVivek Sainani MD, [facility]  [phone] with questions.  Houston SirenSAINANI,VIVEK J M.D on 08/02/2016,at 1:20 PM

## 2016-08-02 NOTE — Progress Notes (Signed)
Pt A and O x 4. VSS. Pt tolerating soft diet well. No complaints of pain or nausea. IV removed intact, prescriptions given. Pt voiced understanding of discharge instructions with no further questions. Pt discharged via wheelchair with nurse aide.

## 2016-08-03 NOTE — Discharge Summary (Signed)
[facility] [place] [facility]   PATIENT NAME: Lynn Larson    MR#:  [id number]  DATE OF BIRTH:  July 25, 1965  DATE OF ADMISSION:  08/01/2016 ADMITTING PHYSICIAN: Enid Baas, MD  DATE OF DISCHARGE: 08/02/2016  5:55 PM  PRIMARY CARE PHYSICIAN: No PCP Per Patient    ADMISSION DIAGNOSIS:  Norovirus [A08.11] Intractable vomiting with nausea, unspecified vomiting type [R11.2]  DISCHARGE DIAGNOSIS:  Active Problems:   Acute gastroenteritis   SECONDARY DIAGNOSIS:   Past Medical History:  Diagnosis Date  . Asthma   . Diabetes (HCC)   . Hypertension     HOSPITAL COURSE:   51 year old female with past medical history of diabetes, hypertension, asthma, history of intractable nausea and vomiting and presented to the hospital due to nausea vomiting and diarrhea.  1. Acute viral gastroenteritis-she nausea vomiting and diarrhea was secondary to Norovirus. -Patient was treated supportively with IV fluids, antibiotics, antidiarrheals. -With supportive care patient has clinically improved, she denies any further nausea vomiting or diarrhea. Her diet was slowly advanced from a clear liquid to regular as tolerated and she is doing well without any exacerbation of her symptoms. She is therefore being discharged home on some as needed Imodium.  2. GERD-patient will continue her Protonix, sucralfate.  3. History of gastroparesis-patient will continue her Reglan.  4. Tobacco abuse - she will cont. Her Nicotine patch.   DISCHARGE CONDITIONS:   Stable.   CONSULTS OBTAINED:    DRUG ALLERGIES:   Allergies  Allergen Reactions  . Chantix [Varenicline]     Suicidal thoughts  . Morphine And Related Other (See Comments)    Reaction:  Redness of face     DISCHARGE MEDICATIONS:   Allergies as of 08/02/2016      Reactions   Chantix [varenicline]    Suicidal thoughts   Morphine And Related Other (See Comments)   Reaction:  Redness of face       Medication  List    STOP taking these medications   feeding supplement Liqd     TAKE these medications   acetaminophen 325 MG tablet Commonly known as:  TYLENOL Take 2 tablets (650 mg total) by mouth every 6 (six) hours as needed for mild pain (or Fever >/= 101).   alum & mag hydroxide-simeth 200-200-20 MG/5ML suspension Commonly known as:  MAALOX/MYLANTA Take 15-30 mLs by mouth 2 (two) times daily. 2 tablespoons in the morning and 1 tablespoon in the evening.   loperamide 2 MG tablet Commonly known as:  IMODIUM A-D Take 1 tablet (2 mg total) by mouth 4 (four) times daily as needed for diarrhea or loose stools.   metoCLOPramide 5 MG tablet Commonly known as:  REGLAN Take 1 tablet (5 mg total) by mouth 3 (three) times daily before meals.   nicotine 7 mg/24hr patch Commonly known as:  NICODERM CQ - dosed in mg/24 hr Place 1 patch (7 mg total) onto the skin daily.   ondansetron 4 MG tablet Commonly known as:  ZOFRAN Take 1 tablet (4 mg total) by mouth every 8 (eight) hours as needed for nausea or vomiting.   pantoprazole 40 MG tablet Commonly known as:  PROTONIX Take 1 tablet (40 mg total) by mouth 2 (two) times daily.   promethazine 25 MG tablet Commonly known as:  PHENERGAN Take 1 tablet (25 mg total) by mouth every 6 (six) hours as needed for nausea or vomiting.   sucralfate 1 g tablet Commonly known as:  CARAFATE Take 1 tablet (  1 g total) by mouth 4 (four) times daily -  with meals and at bedtime.         DISCHARGE INSTRUCTIONS:   DIET:  Regular diet  DISCHARGE CONDITION:  Stable  ACTIVITY:  Activity as tolerated  OXYGEN:  Home Oxygen: No.   Oxygen Delivery: room air  DISCHARGE LOCATION:  home   If you experience worsening of your admission symptoms, develop shortness of breath, life threatening emergency, suicidal or homicidal thoughts you must seek medical attention immediately by calling 911 or calling your MD immediately  if symptoms less severe.  You  Must read complete instructions/literature along with all the possible adverse reactions/side effects for all the Medicines you take and that have been prescribed to you. Take any new Medicines after you have completely understood and accpet all the possible adverse reactions/side effects.   Please note  You were cared for by a hospitalist during your hospital stay. If you have any questions about your discharge medications or the care you received while you were in the hospital after you are discharged, you can call the unit and asked to speak with the hospitalist on call if the hospitalist that took care of you is not available. Once you are discharged, your primary care physician will handle any further medical issues. Please note that NO REFILLS for any discharge medications will be authorized once you are discharged, as it is imperative that you return to your primary care physician (or establish a relationship with a primary care physician if you do not have one) for your aftercare needs so that they can reassess your need for medications and monitor your lab values.     Today   No further Diarrhea this a.m. No abdominal pain, N/V.  Will start on clear liquids and advance as tolerated.   VITAL SIGNS:  Blood pressure 130/79, pulse 76, temperature 98.5 F (36.9 C), temperature source Oral, resp. rate 14, height 5\' 3"  (1.6 m), weight 72.6 kg (160 lb), SpO2 99 %.  I/O:   Intake/Output Summary (Last 24 hours) at 08/03/16 1447 Last data filed at 08/02/16 1603  Gross per 24 hour  Intake              301 ml  Output              350 ml  Net              -49 ml    PHYSICAL EXAMINATION:  GENERAL:  51 y.o.-year-old patient lying in the bed with no acute distress.  EYES: Pupils equal, round, reactive to light and accommodation. No scleral icterus. Extraocular muscles intact.  HEENT: Head atraumatic, normocephalic. Oropharynx and nasopharynx clear.  NECK:  Supple, no jugular venous distention.  No thyroid enlargement, no tenderness.  LUNGS: Normal breath sounds bilaterally, no wheezing, rales,rhonchi. No use of accessory muscles of respiration.  CARDIOVASCULAR: S1, S2 normal. No murmurs, rubs, or gallops.  ABDOMEN: Soft, non-tender, non-distended. Bowel sounds present. No organomegaly or mass.  EXTREMITIES: No pedal edema, cyanosis, or clubbing.  NEUROLOGIC: Cranial nerves II through XII are intact. No focal motor or sensory defecits b/l.  PSYCHIATRIC: The patient is alert and oriented x 3.  SKIN: No obvious rash, lesion, or ulcer.   DATA REVIEW:   CBC  Recent Labs Lab 08/02/16 0701  WBC 7.3  HGB 13.5  HCT 40.0  PLT 280    Chemistries   Recent Labs Lab 08/01/16 0527 08/02/16 0701  NA 140 141  K 3.5 3.7  CL 100* 109  CO2 30 26  GLUCOSE 125* 97  BUN 16 8  CREATININE 0.78 0.62  CALCIUM 9.3 8.6*  AST 24  --   ALT 20  --   ALKPHOS 92  --   BILITOT 0.5  --     Cardiac Enzymes No results for input(s): TROPONINI in the last 168 hours.  Microbiology Results  Results for orders placed or performed during the hospital encounter of 08/01/16  C difficile quick scan w PCR reflex     Status: None   Collection Time: 08/01/16  7:49 AM  Result Value Ref Range Status   C Diff antigen NEGATIVE NEGATIVE Final   C Diff toxin NEGATIVE NEGATIVE Final   C Diff interpretation No C. difficile detected.  Final  Gastrointestinal Panel by PCR , Stool     Status: Abnormal   Collection Time: 08/01/16  7:49 AM  Result Value Ref Range Status   Campylobacter species NOT DETECTED NOT DETECTED Final   Plesimonas shigelloides NOT DETECTED NOT DETECTED Final   Salmonella species NOT DETECTED NOT DETECTED Final   Yersinia enterocolitica NOT DETECTED NOT DETECTED Final   Vibrio species NOT DETECTED NOT DETECTED Final   Vibrio cholerae NOT DETECTED NOT DETECTED Final   Enteroaggregative E coli (EAEC) NOT DETECTED NOT DETECTED Final   Enteropathogenic E coli (EPEC) NOT DETECTED NOT  DETECTED Final   Enterotoxigenic E coli (ETEC) NOT DETECTED NOT DETECTED Final   Shiga like toxin producing E coli (STEC) NOT DETECTED NOT DETECTED Final   Shigella/Enteroinvasive E coli (EIEC) NOT DETECTED NOT DETECTED Final   Cryptosporidium NOT DETECTED NOT DETECTED Final   Cyclospora cayetanensis NOT DETECTED NOT DETECTED Final   Entamoeba histolytica NOT DETECTED NOT DETECTED Final   Giardia lamblia NOT DETECTED NOT DETECTED Final   Adenovirus F40/41 NOT DETECTED NOT DETECTED Final   Astrovirus NOT DETECTED NOT DETECTED Final   Norovirus GI/GII DETECTED (A) NOT DETECTED Final    Comment: RESULT CALLED TO, READ BACK BY AND VERIFIED WITH: Marylee FlorasValerie Chandler @ 1002 08/01/16 by T[facility]    Rotavirus A NOT DETECTED NOT DETECTED Final   Sapovirus (I, II, IV, and V) NOT DETECTED NOT DETECTED Final    RADIOLOGY:  No results found.    Management plans discussed with the patient, family and they are in agreement.  CODE STATUS:  Code Status History    Date Active Date Inactive Code Status Order ID Comments User Context   08/01/2016 12:39 PM 08/02/2016  9:00 PM Full Code [id number]  Enid Baasadhika Kalisetti, MD Inpatient   07/28/2016  2:28 PM 07/30/2016  8:24 PM Full Code [id number]  Alford Highlandichard Wieting, MD ED   07/22/2016 11:48 PM 07/24/2016  5:59 PM Full Code [id number]  Oralia Manisavid Willis, MD Inpatient   01/16/2016  9:33 PM 01/20/2016  3:07 PM Full Code [id number]  Houston SirenVivek J Tore Carreker, MD ED   08/14/2015  5:33 PM 08/17/2015  2:44 PM Full Code [id number]  Adrian SaranSital Mody, MD Inpatient   02/15/2015  8:31 PM 02/18/2015 10:31 PM Full Code [id number]  Katha HammingSnehalatha Konidena, MD ED      TOTAL TIME TAKING CARE OF THIS PATIENT: 40 minutes.    Houston SirenSAINANI,Kairah Leoni J M.D on 08/03/2016 at 2:47 PM  Between 7am to 6pm - Pager - [phone]  After 6pm go to [email or web address] - Scientist, research (life sciences)password [email or web address]  [facility] [place] [facility]  Office  [phone]  CC: Primary care physician; No PCP Per Patient

## 2016-08-09 ENCOUNTER — Encounter: Payer: Self-pay | Admitting: Emergency Medicine

## 2016-08-09 ENCOUNTER — Emergency Department
Admission: EM | Admit: 2016-08-09 | Discharge: 2016-08-09 | Disposition: A | Discharge status: Home / Self Care | Payer: Self-pay | Attending: Emergency Medicine | Admitting: Emergency Medicine

## 2016-08-09 DIAGNOSIS — E119 Type 2 diabetes mellitus without complications: Secondary | ICD-10-CM | POA: Insufficient documentation

## 2016-08-09 DIAGNOSIS — R112 Nausea with vomiting, unspecified: Secondary | ICD-10-CM | POA: Insufficient documentation

## 2016-08-09 DIAGNOSIS — Z87891 Personal history of nicotine dependence: Secondary | ICD-10-CM | POA: Insufficient documentation

## 2016-08-09 DIAGNOSIS — I10 Essential (primary) hypertension: Secondary | ICD-10-CM | POA: Insufficient documentation

## 2016-08-09 DIAGNOSIS — Z79899 Other long term (current) drug therapy: Secondary | ICD-10-CM | POA: Insufficient documentation

## 2016-08-09 DIAGNOSIS — R109 Unspecified abdominal pain: Secondary | ICD-10-CM | POA: Insufficient documentation

## 2016-08-09 DIAGNOSIS — J45909 Unspecified asthma, uncomplicated: Secondary | ICD-10-CM | POA: Insufficient documentation

## 2016-08-09 LAB — COMPREHENSIVE METABOLIC PANEL
ALBUMIN: 4.8 g/dL (ref 3.5–5.0)
ALT: 30 U/L (ref 14–54)
AST: 28 U/L (ref 15–41)
Alkaline Phosphatase: 99 U/L (ref 38–126)
Anion gap: 11 (ref 5–15)
BUN: 15 mg/dL (ref 6–20)
CO2: 26 mmol/L (ref 22–32)
CREATININE: 0.74 mg/dL (ref 0.44–1.00)
Calcium: 9.8 mg/dL (ref 8.9–10.3)
Chloride: 104 mmol/L (ref 101–111)
GFR calc Af Amer: >60 mL/min (ref >60)
GFR calc non Af Amer: >60 mL/min (ref >60)
GLUCOSE: 129 mg/dL — AB (ref 65–99)
Potassium: 3.9 mmol/L (ref 3.5–5.1)
Sodium: 141 mmol/L (ref 135–145)
Total Bilirubin: 0.6 mg/dL (ref 0.3–1.2)
Total Protein: 8.8 g/dL — ABNORMAL HIGH (ref 6.5–8.1)

## 2016-08-09 LAB — CBC WITH DIFFERENTIAL/PLATELET
BASOS ABS: 0.1 10*3/uL (ref 0–0.1)
BASOS PCT: 1 %
EOS PCT: 1 %
Eosinophils Absolute: 0.2 10*3/uL (ref 0–0.7)
HEMATOCRIT: 43.9 % (ref 35.0–47.0)
Hemoglobin: 15.3 g/dL (ref 12.0–16.0)
LYMPHS PCT: 12 %
Lymphs Abs: 2.4 10*3/uL (ref 1.0–3.6)
MCH: 29.8 pg (ref 26.0–34.0)
MCHC: 34.8 g/dL (ref 32.0–36.0)
MCV: 85.5 fL (ref 80.0–100.0)
Monocytes Absolute: 1 10*3/uL — ABNORMAL HIGH (ref 0.2–0.9)
Monocytes Relative: 5 %
NEUTROS ABS: 17 10*3/uL — AB (ref 1.4–6.5)
Neutrophils Relative %: 81 %
PLATELETS: 527 10*3/uL — AB (ref 150–440)
RBC: 5.14 MIL/uL (ref 3.80–5.20)
RDW: 14.7 % — ABNORMAL HIGH (ref 11.5–14.5)
WBC: 20.7 10*3/uL — AB (ref 3.6–11.0)

## 2016-08-09 LAB — LIPASE, BLOOD: LIPASE: 26 U/L (ref 11–51)

## 2016-08-09 MED ORDER — HALOPERIDOL LACTATE 5 MG/ML IJ SOLN
5.0000 mg | Freq: Once | INTRAMUSCULAR | Status: AC
  Administered 2016-08-09: 5 mg via INTRAMUSCULAR
  Filled 2016-08-09: qty 1

## 2016-08-09 MED ORDER — DICYCLOMINE HCL 10 MG/ML IM SOLN
20.0000 mg | Freq: Once | INTRAMUSCULAR | Status: AC
Start: 2016-08-09 — End: 2016-08-09
  Administered 2016-08-09: 20 mg via INTRAMUSCULAR
  Filled 2016-08-09 (×2): qty 2

## 2016-08-09 MED ORDER — PROCHLORPERAZINE MALEATE 10 MG PO TABS: 10.0000 mg | ORAL_TABLET | Freq: Three times a day (TID) | ORAL | 0 refills | Status: DC | PRN

## 2016-08-09 MED ORDER — PROCHLORPERAZINE EDISYLATE 5 MG/ML IJ SOLN
10.0000 mg | Freq: Once | INTRAMUSCULAR | Status: AC
  Administered 2016-08-09: 10 mg via INTRAMUSCULAR
  Filled 2016-08-09: qty 2

## 2016-08-09 MED ORDER — ONDANSETRON HCL 4 MG/2ML IJ SOLN
4.0000 mg | Freq: Once | INTRAMUSCULAR | Status: AC
  Administered 2016-08-09: 4 mg via INTRAVENOUS
  Filled 2016-08-09: qty 2

## 2016-08-09 MED ORDER — PROMETHAZINE HCL 25 MG RE SUPP: 25.0000 mg | Freq: Four times a day (QID) | RECTAL | 1 refills | Status: DC | PRN

## 2016-08-09 NOTE — Discharge Instructions (Signed)
Please seek medical attention for any high fevers, chest pain, shortness of breath, change in behavior, persistent vomiting, bloody stool or any other new or concerning symptoms.  

## 2016-08-09 NOTE — ED Notes (Signed)
Report given to Kuch, RN.  

## 2016-08-09 NOTE — ED Notes (Signed)
Pt resting in bed, eyes closed. Respirations even and unlabored at this time. Will continue to monitor for further patient needs.

## 2016-08-09 NOTE — ED Triage Notes (Signed)
Patient from home with N/V/D/Abd Pain for 1 week. Seen here for same on Monday and diagnosed with Noro.

## 2016-08-09 NOTE — ED Notes (Signed)
This RN called to bedside, pt states "I pulled my IV out, it just got caught in the blankets and I think it wasn't taped down good enough!". This RN made sure site was not bleeding, MD notified, per MD, pt does not need to have another IV established at this time.

## 2016-08-09 NOTE — ED Notes (Signed)
Pt declined wheelchair, taken all her belongings with her, denies missing anything upon leaving the ED.

## 2016-08-09 NOTE — ED Notes (Signed)
Pt given 2 warm blankets by this RN. Pt continually repeating, "I need something else for nausea!". This Rn explained that patient could not have any medications without a doctors order. Pt states understanding, noted to be tearful and crying at this time.

## 2016-08-09 NOTE — ED Provider Notes (Signed)
[facility] Emergency Department Provider Note   ____________________________________________   I have reviewed the triage vital signs and the nursing notes.   HISTORY  Chief Complaint Emesis   History limited by: Not Limited   HPI Lynn Larson is a 51 y.o. female who presents to the emergency department today because of concern for vomiting and stomach pain. Patient states symptoms started this morning. Initially it was just vomiting. Has had multiple episodes. It is now accompanied by pain in the epigastric region. Patient has not followed up with GI. Has been seen multiple times in the emergency department for this. Was discharged from the hospital one week ago for similar symptoms.   Past Medical History:  Diagnosis Date  . Asthma   . Diabetes (HCC)   . Hypertension     Patient Active Problem List   Diagnosis Date Noted  . Acute gastroenteritis 08/01/2016  . Nausea and vomiting 07/28/2016  . HTN (hypertension) 07/22/2016  . Asthma 07/22/2016  . Epigastric abdominal pain   . Colitis 01/16/2016  . Hematemesis 08/14/2015  . Nausea & vomiting 02/15/2015    Past Surgical History:  Procedure Laterality Date  . CHOLECYSTECTOMY    . ESOPHAGOGASTRODUODENOSCOPY (EGD) WITH PROPOFOL [name] 08/15/2015   Procedure: ESOPHAGOGASTRODUODENOSCOPY (EGD) WITH PROPOFOL;  Surgeon: Christena DeemMartin U Skulskie, MD;  [facility];  Service: Endoscopy;  Laterality: [name];    Prior to Admission medications   Medication Sig Start Date End Date Taking? Authorizing Provider  acetaminophen (TYLENOL) 325 MG tablet Take 2 tablets (650 mg total) by mouth every 6 (six) hours as needed for mild pain (or Fever >/= 101). 07/24/16   Ramonita LabAruna Gouru, MD  alum & mag hydroxide-simeth (MAALOX/MYLANTA) 200-200-20 MG/5ML suspension Take 15-30 mLs by mouth 2 (two) times daily. 2 tablespoons in the morning and 1 tablespoon in the evening.    Historical Provider, MD  loperamide (IMODIUM A-D) 2 MG  tablet Take 1 tablet (2 mg total) by mouth 4 (four) times daily as needed for diarrhea or loose stools. 08/02/16   Houston SirenVivek J Sainani, MD  metoCLOPramide (REGLAN) 5 MG tablet Take 1 tablet (5 mg total) by mouth 3 (three) times daily before meals. 01/20/16   Enid Baasadhika Kalisetti, MD  nicotine (NICODERM CQ - DOSED IN MG/24 HR) 7 mg/24hr patch Place 1 patch (7 mg total) onto the skin daily. Patient not taking: Reported on 07/28/2016 07/25/16   Ramonita LabAruna Gouru, MD  ondansetron (ZOFRAN) 4 MG tablet Take 1 tablet (4 mg total) by mouth every 8 (eight) hours as needed for nausea or vomiting. 07/24/16 07/24/17  Ramonita LabAruna Gouru, MD  pantoprazole (PROTONIX) 40 MG tablet Take 1 tablet (40 mg total) by mouth 2 (two) times daily. 01/20/16   Enid Baasadhika Kalisetti, MD  promethazine (PHENERGAN) 25 MG tablet Take 1 tablet (25 mg total) by mouth every 6 (six) hours as needed for nausea or vomiting. 01/20/16   Enid Baasadhika Kalisetti, MD  sucralfate (CARAFATE) 1 g tablet Take 1 tablet (1 g total) by mouth 4 (four) times daily -  with meals and at bedtime. 07/24/16   Ramonita LabAruna Gouru, MD    Allergies Chantix [varenicline] and Morphine and related  Family History  Problem Relation Age of Onset  . Breast cancer Maternal Grandmother   . Ovarian cancer Maternal Grandmother     Social History Social History  Substance Use Topics  . Smoking status: Former Smoker    Packs/day: 0.50    Years: 30.00    Types: Cigarettes  . Smokeless tobacco:  Never Used  . Alcohol use No    Review of Systems  Constitutional: Negative for fever. Cardiovascular: Negative for chest pain. Respiratory: Negative for shortness of breath. Gastrointestinal: Positive for abdominal pain, nausea and vomiting.  Neurological: Negative for headaches, focal weakness or numbness.  10-point ROS otherwise negative.  ____________________________________________   PHYSICAL EXAM:  VITAL SIGNS: ED Triage Vitals [08/09/16 1626]  Enc Vitals Group     BP      Pulse      Resp       Temp      Temp src      SpO2      Weight 160 lb (72.6 kg)     Height 5\' 3"  (1.6 m)    Constitutional: Alert and oriented. Well appearing and in no distress. Eyes: Conjunctivae are normal. Normal extraocular movements. ENT   Head: Normocephalic and atraumatic.   Nose: No congestion/rhinnorhea.   Mouth/Throat: Mucous membranes are moist.   Neck: No stridor. Hematological/Lymphatic/Immunilogical: No cervical lymphadenopathy. Cardiovascular: Normal rate, regular rhythm.  No murmurs, rubs, or gallops.  Respiratory: Normal respiratory effort without tachypnea nor retractions. Breath sounds are clear and equal bilaterally. No wheezes/rales/rhonchi. Gastrointestinal: Soft and non tender. No rebound. No guarding.  Genitourinary: Deferred Musculoskeletal: Normal range of motion in all extremities. No lower extremity edema. Neurologic:  Normal speech and language. No gross focal neurologic deficits are appreciated.  Skin:  Skin is warm, dry and intact. No rash noted. Psychiatric: Mood and affect are normal. Speech and behavior are normal. Patient exhibits appropriate insight and judgment.  ____________________________________________    LABS (pertinent positives/negatives)  Labs Reviewed  CBC WITH DIFFERENTIAL/PLATELET - Abnormal; Notable for the following:       Result Value   WBC 20.7 (*)    RDW 14.7 (*)    Platelets 527 (*)    Neutro Abs 17.0 (*)    Monocytes Absolute 1.0 (*)    All other components within normal limits  COMPREHENSIVE METABOLIC PANEL - Abnormal; Notable for the following:    Glucose, Bld 129 (*)    Total Protein 8.8 (*)    All other components within normal limits  LIPASE, BLOOD     ____________________________________________   EKG  None  ____________________________________________    RADIOLOGY  None  ____________________________________________   PROCEDURES  Procedures  ____________________________________________   INITIAL  IMPRESSION / ASSESSMENT AND PLAN / ED COURSE  Pertinent labs & imaging results that were available during my care of the patient were reviewed by me and considered in my medical decision making (see chart for details).  Patient presents to the emergency department today because of concerns for nausea vomiting and abdominal pain. Patient has had multiple emergency department visits as well as admissions for similar symptoms. Has not followed up with GI recently. Patient's blood work did show a leukocytosis of 20. I did discussion with the patient. She has had multiple CTs of the abdomen and pelvis which have shown sometimes some thickened colon however no other concerning symptoms. She has had a leukocytosis of roughly the same value in the past as well. This point patient states that she is okay deferring CT scan given risk of radiation and previous negative studies. Will give patient medications to try to help control symptoms.  ----------------------------------------- 7:40 PM on 08/09/2016 -----------------------------------------  Patient was asleep when I went back to check on her. I did wake the patient up. She states she was feeling better. It does seem that she responded well to  the Compazine. Will plan on discharging patient home with prescription for Compazine. Additionally will give patient prescription for suppository.  ____________________________________________   FINAL CLINICAL IMPRESSION(S) / ED DIAGNOSES  Final diagnoses:  Non-intractable vomiting with nausea, unspecified vomiting type     Note: This dictation was prepared with Dragon dictation. Any transcriptional errors that result from this process are unintentional     Phineas Semen, MD 08/09/16 1944

## 2016-08-11 ENCOUNTER — Emergency Department: Payer: Self-pay

## 2016-08-11 ENCOUNTER — Encounter: Payer: Self-pay | Admitting: Emergency Medicine

## 2016-08-11 ENCOUNTER — Inpatient Hospital Stay
Admission: EM | Admit: 2016-08-11 | Discharge: 2016-08-14 | DRG: 392 | Disposition: A | Discharge status: Home / Self Care | Payer: Self-pay | Attending: Internal Medicine | Admitting: Internal Medicine

## 2016-08-11 DIAGNOSIS — Z87891 Personal history of nicotine dependence: Secondary | ICD-10-CM

## 2016-08-11 DIAGNOSIS — K449 Diaphragmatic hernia without obstruction or gangrene: Secondary | ICD-10-CM | POA: Diagnosis present

## 2016-08-11 DIAGNOSIS — K529 Noninfective gastroenteritis and colitis, unspecified: Principal | ICD-10-CM | POA: Diagnosis present

## 2016-08-11 DIAGNOSIS — F1290 Cannabis use, unspecified, uncomplicated: Secondary | ICD-10-CM | POA: Diagnosis present

## 2016-08-11 DIAGNOSIS — E860 Dehydration: Secondary | ICD-10-CM | POA: Diagnosis present

## 2016-08-11 DIAGNOSIS — J45909 Unspecified asthma, uncomplicated: Secondary | ICD-10-CM | POA: Diagnosis present

## 2016-08-11 DIAGNOSIS — Z79899 Other long term (current) drug therapy: Secondary | ICD-10-CM

## 2016-08-11 DIAGNOSIS — K3184 Gastroparesis: Secondary | ICD-10-CM | POA: Diagnosis present

## 2016-08-11 DIAGNOSIS — K640 First degree hemorrhoids: Secondary | ICD-10-CM | POA: Diagnosis present

## 2016-08-11 DIAGNOSIS — R7989 Other specified abnormal findings of blood chemistry: Secondary | ICD-10-CM | POA: Diagnosis present

## 2016-08-11 DIAGNOSIS — K2970 Gastritis, unspecified, without bleeding: Secondary | ICD-10-CM | POA: Diagnosis present

## 2016-08-11 DIAGNOSIS — I10 Essential (primary) hypertension: Secondary | ICD-10-CM | POA: Diagnosis present

## 2016-08-11 DIAGNOSIS — E876 Hypokalemia: Secondary | ICD-10-CM | POA: Diagnosis present

## 2016-08-11 DIAGNOSIS — Z885 Allergy status to narcotic agent status: Secondary | ICD-10-CM

## 2016-08-11 DIAGNOSIS — R112 Nausea with vomiting, unspecified: Secondary | ICD-10-CM

## 2016-08-11 DIAGNOSIS — Z888 Allergy status to other drugs, medicaments and biological substances status: Secondary | ICD-10-CM

## 2016-08-11 DIAGNOSIS — K2980 Duodenitis without bleeding: Secondary | ICD-10-CM | POA: Diagnosis present

## 2016-08-11 DIAGNOSIS — K5730 Diverticulosis of large intestine without perforation or abscess without bleeding: Secondary | ICD-10-CM | POA: Diagnosis present

## 2016-08-11 DIAGNOSIS — R109 Unspecified abdominal pain: Secondary | ICD-10-CM

## 2016-08-11 DIAGNOSIS — E1143 Type 2 diabetes mellitus with diabetic autonomic (poly)neuropathy: Secondary | ICD-10-CM | POA: Diagnosis present

## 2016-08-11 HISTORY — DX: Gastric ulcer, unspecified as acute or chronic, without hemorrhage or perforation: K25.9

## 2016-08-11 LAB — COMPREHENSIVE METABOLIC PANEL
ALT: 28 U/L (ref 14–54)
AST: 32 U/L (ref 15–41)
Albumin: 5.2 g/dL — ABNORMAL HIGH (ref 3.5–5.0)
Alkaline Phosphatase: 100 U/L (ref 38–126)
Anion gap: 12 (ref 5–15)
BUN: 13 mg/dL (ref 6–20)
CHLORIDE: 99 mmol/L — AB (ref 101–111)
CO2: 23 mmol/L (ref 22–32)
CREATININE: 0.85 mg/dL (ref 0.44–1.00)
Calcium: 10.7 mg/dL — ABNORMAL HIGH (ref 8.9–10.3)
GFR calc non Af Amer: >60 mL/min (ref >60)
Glucose, Bld: 116 mg/dL — ABNORMAL HIGH (ref 65–99)
Potassium: 3.3 mmol/L — ABNORMAL LOW (ref 3.5–5.1)
SODIUM: 134 mmol/L — AB (ref 135–145)
Total Bilirubin: 1.5 mg/dL — ABNORMAL HIGH (ref 0.3–1.2)
Total Protein: 9 g/dL — ABNORMAL HIGH (ref 6.5–8.1)

## 2016-08-11 LAB — CBC WITH DIFFERENTIAL/PLATELET
BASOS PCT: 1 %
Basophils Absolute: 0.2 10*3/uL — ABNORMAL HIGH (ref 0–0.1)
EOS ABS: 0 10*3/uL (ref 0–0.7)
Eosinophils Relative: 0 %
HCT: 46.3 % (ref 35.0–47.0)
Hemoglobin: 15.9 g/dL (ref 12.0–16.0)
Lymphocytes Relative: 24 %
Lymphs Abs: 5.7 10*3/uL — ABNORMAL HIGH (ref 1.0–3.6)
MCH: 28.9 pg (ref 26.0–34.0)
MCHC: 34.4 g/dL (ref 32.0–36.0)
MCV: 84 fL (ref 80.0–100.0)
MONO ABS: 2.4 10*3/uL — AB (ref 0.2–0.9)
Monocytes Relative: 10 %
NEUTROS PCT: 65 %
Neutro Abs: 15.6 10*3/uL — ABNORMAL HIGH (ref 1.4–6.5)
PLATELETS: 567 10*3/uL — AB (ref 150–440)
RBC: 5.51 MIL/uL — AB (ref 3.80–5.20)
RDW: 14.1 % (ref 11.5–14.5)
WBC: 23.9 10*3/uL — AB (ref 3.6–11.0)

## 2016-08-11 LAB — URINALYSIS, ROUTINE W REFLEX MICROSCOPIC
BILIRUBIN URINE: NEGATIVE
Bacteria, UA: NONE SEEN
GLUCOSE, UA: NEGATIVE mg/dL
Ketones, ur: NEGATIVE mg/dL
Leukocytes, UA: NEGATIVE
NITRITE: NEGATIVE
PROTEIN: 30 mg/dL — AB
Specific Gravity, Urine: 1.013 (ref 1.005–1.030)
pH: 6 (ref 5.0–8.0)

## 2016-08-11 LAB — LIPASE, BLOOD: Lipase: 48 U/L (ref 11–51)

## 2016-08-11 MED ORDER — PROMETHAZINE HCL 25 MG/ML IJ SOLN
25.0000 mg | Freq: Once | INTRAMUSCULAR | Status: AC
  Administered 2016-08-11: 25 mg via INTRAVENOUS
  Filled 2016-08-11: qty 1

## 2016-08-11 MED ORDER — HYDROMORPHONE HCL 1 MG/ML IJ SOLN: 1.0000 mg | Freq: Once | INTRAMUSCULAR | Status: DC

## 2016-08-11 MED ORDER — HYDROMORPHONE HCL 1 MG/ML IJ SOLN
INTRAMUSCULAR | Status: AC
  Filled 2016-08-11: qty 1

## 2016-08-11 MED ORDER — ONDANSETRON HCL 4 MG/2ML IJ SOLN
4.0000 mg | Freq: Once | INTRAMUSCULAR | Status: AC
  Administered 2016-08-11: 4 mg via INTRAVENOUS
  Filled 2016-08-11: qty 2

## 2016-08-11 MED ORDER — SODIUM CHLORIDE 0.9 % IV BOLUS (SEPSIS)
1000.0000 mL | Freq: Once | INTRAVENOUS | Status: AC
  Administered 2016-08-11: 1000 mL via INTRAVENOUS

## 2016-08-11 MED ORDER — HYDROMORPHONE HCL 1 MG/ML IJ SOLN
0.5000 mg | Freq: Once | INTRAMUSCULAR | Status: AC
  Administered 2016-08-11: 0.5 mg via INTRAVENOUS
  Filled 2016-08-11: qty 1

## 2016-08-11 MED ORDER — IOPAMIDOL (ISOVUE-300) INJECTION 61%
100.0000 mL | Freq: Once | INTRAVENOUS | Status: AC | PRN
  Administered 2016-08-11: 100 mL via INTRAVENOUS

## 2016-08-11 MED ORDER — CIPROFLOXACIN IN D5W 400 MG/200ML IV SOLN
400.0000 mg | Freq: Once | INTRAVENOUS | Status: AC
Start: 2016-08-11 — End: 2016-08-11
  Administered 2016-08-11: 400 mg via INTRAVENOUS
  Filled 2016-08-11: qty 200

## 2016-08-11 MED ORDER — HYDROMORPHONE HCL 1 MG/ML IJ SOLN
1.0000 mg | Freq: Once | INTRAMUSCULAR | Status: AC
  Administered 2016-08-11: 1 mg via INTRAVENOUS

## 2016-08-11 MED ORDER — IOPAMIDOL (ISOVUE-300) INJECTION 61%
30.0000 mL | Freq: Once | INTRAVENOUS | Status: AC | PRN
  Administered 2016-08-11: 30 mL via ORAL

## 2016-08-11 MED ORDER — METRONIDAZOLE 500 MG PO TABS
500.0000 mg | ORAL_TABLET | Freq: Three times a day (TID) | ORAL | Status: DC
  Administered 2016-08-11: 500 mg via ORAL
  Filled 2016-08-11: qty 1

## 2016-08-11 MED ORDER — ONDANSETRON 4 MG PO TBDP: 4.0000 mg | ORAL_TABLET | Freq: Once | ORAL | Status: DC

## 2016-08-11 MED ORDER — GI COCKTAIL ~~LOC~~
30.0000 mL | Freq: Once | ORAL | Status: AC
  Administered 2016-08-11: 30 mL via ORAL
  Filled 2016-08-11: qty 30

## 2016-08-11 NOTE — ED Triage Notes (Signed)
Pt from home via ems with abd pain, n/v/d for three days. Was just seen here for same three days ago and given compazine, phenergan, which did not get filled.  Per ems to hypertensive all other vss.  Ems did not est an iv prior to arrival.

## 2016-08-11 NOTE — ED Notes (Signed)
Pt c/o nausea and is dry heaving upon entering room to check on pt.  Orders to give 25mg  IV Phenergan, see MAR.

## 2016-08-11 NOTE — ED Provider Notes (Signed)
[facility] Emergency Department Provider Note  Time seen: 5:38 PM  I have reviewed the triage vital signs and the nursing notes.   HISTORY  Chief Complaint Abdominal Pain; Nausea; Emesis; and Diarrhea    HPI Lynn Larson is a 51 y.o. female with a past medical history of asthma, diabetes, hypertension who presents to the emergency department with upper abdominal pain nausea and vomiting. Patient has a history of chronic/recurrent upper abdominal discomfort and intractable nausea and vomiting. Patient was last seen in the emergency department 2 days ago for the same. Last admitted approximately 10 days ago for the same. Patient states moderate epigastric pain which she describes as sharp and burning. States diarrhea. Denies constipation. Denies black or bloody stool. Denies dysuria. On arrival patient in mild distress holding her upper abdomen.  Past Medical History:  Diagnosis Date  . Asthma   . Diabetes (HCC)   . Hypertension     Patient Active Problem List   Diagnosis Date Noted  . Acute gastroenteritis 08/01/2016  . Nausea and vomiting 07/28/2016  . HTN (hypertension) 07/22/2016  . Asthma 07/22/2016  . Epigastric abdominal pain   . Colitis 01/16/2016  . Hematemesis 08/14/2015  . Nausea & vomiting 02/15/2015    Past Surgical History:  Procedure Laterality Date  . CHOLECYSTECTOMY    . ESOPHAGOGASTRODUODENOSCOPY (EGD) WITH PROPOFOL [name] 08/15/2015   Procedure: ESOPHAGOGASTRODUODENOSCOPY (EGD) WITH PROPOFOL;  Surgeon: Christena DeemMartin U Skulskie, MD;  Location[facility] ENDOSCOPY;  Service: Endoscopy;  Laterality: [name];    Prior to Admission medications   Medication Sig Start Date End Date Taking? Authorizing Provider  acetaminophen (TYLENOL) 325 MG tablet Take 2 tablets (650 mg total) by mouth every 6 (six) hours as needed for mild pain (or Fever >/= 101). 07/24/16   Ramonita LabAruna Gouru, MD  alum & mag hydroxide-simeth (MAALOX/MYLANTA) 200-200-20 MG/5ML suspension Take 15-30  mLs by mouth 2 (two) times daily. 2 tablespoons in the morning and 1 tablespoon in the evening.    Historical Provider, MD  loperamide (IMODIUM A-D) 2 MG tablet Take 1 tablet (2 mg total) by mouth 4 (four) times daily as needed for diarrhea or loose stools. 08/02/16   Houston SirenVivek J Sainani, MD  metoCLOPramide (REGLAN) 5 MG tablet Take 1 tablet (5 mg total) by mouth 3 (three) times daily before meals. 01/20/16   Enid Baasadhika Kalisetti, MD  ondansetron (ZOFRAN) 4 MG tablet Take 1 tablet (4 mg total) by mouth every 8 (eight) hours as needed for nausea or vomiting. 07/24/16 07/24/17  Ramonita LabAruna Gouru, MD  pantoprazole (PROTONIX) 40 MG tablet Take 1 tablet (40 mg total) by mouth 2 (two) times daily. 01/20/16   Enid Baasadhika Kalisetti, MD  prochlorperazine (COMPAZINE) 10 MG tablet Take 1 tablet (10 mg total) by mouth every 8 (eight) hours as needed (headache). 08/09/16   Phineas SemenGraydon Goodman, MD  promethazine (PHENERGAN) 25 MG suppository Place 1 suppository (25 mg total) rectally every 6 (six) hours as needed for nausea. 08/09/16 08/09/17  Phineas SemenGraydon Goodman, MD  promethazine (PHENERGAN) 25 MG tablet Take 1 tablet (25 mg total) by mouth every 6 (six) hours as needed for nausea or vomiting. 01/20/16   Enid Baasadhika Kalisetti, MD  sucralfate (CARAFATE) 1 g tablet Take 1 tablet (1 g total) by mouth 4 (four) times daily -  with meals and at bedtime. 07/24/16   Ramonita LabAruna Gouru, MD    Allergies  Allergen Reactions  . Chantix [Varenicline]     Suicidal thoughts  . Morphine And Related Other (See Comments)  Reaction:  Redness of face     Family History  Problem Relation Age of Onset  . Breast cancer Maternal Grandmother   . Ovarian cancer Maternal Grandmother     Social History Social History  Substance Use Topics  . Smoking status: Former Smoker    Packs/day: 0.50    Years: 30.00    Types: Cigarettes  . Smokeless tobacco: Never Used  . Alcohol use No    Review of Systems Constitutional: Negative for fever Cardiovascular: Negative for chest  pain. Respiratory: Negative for shortness of breath. Gastrointestinal: Epigastric pain. Positive for nausea and vomiting. Negative for constipation. Several episodes of diarrhea. Genitourinary: Negative for dysuria. Neurological: Negative for headache 10-point ROS otherwise negative.  ____________________________________________   PHYSICAL EXAM:  VITAL SIGNS: ED Triage Vitals  Enc Vitals Group     BP 08/11/16 1732 (!) 143/108     Pulse Rate 08/11/16 1732 (!) 108     Resp 08/11/16 1732 (!) 27     Temp 08/11/16 1732 98.9 F (37.2 C)     Temp Source 08/11/16 1732 Oral     SpO2 08/11/16 1732 98 %     Weight 08/11/16 1733 160 lb (72.6 kg)     Height --      Head Circumference --      Peak Flow --      Pain Score 08/11/16 1734 7     Pain Loc --      Pain Edu? --      Excl. in GC? --     Constitutional: Alert and oriented. Mild distress due to epigastric pain. Eyes: Normal exam ENT   Head: Normocephalic and atraumatic   Mouth/Throat: Mucous membranes are moist. Cardiovascular: Normal rate, regular rhythm. No murmur Respiratory: Normal respiratory effort without tachypnea nor retractions. Breath sounds are clear Gastrointestinal: Soft, moderate epigastric tenderness palpation, no rebound or guarding. No distention. Musculoskeletal: Nontender with normal range of motion in all extremities. Neurologic:  Normal speech and language. No gross focal neurologic deficits  Skin:  Skin is warm, dry and intact.  Psychiatric: Mood and affect are normal.  ____________________________________________    EKG  EKG reviewed and interpreted by myself shows sinus tachycardia 109 bpm, narrow QRS, normal axis, normal intervals, nonspecific ST changes without ST elevation.  ____________________________________________    RADIOLOGY   IMPRESSION: 1. Findings concerning for colitis with diarrheal state. Correlation with clinical exam and stool cultures recommended. No  bowel obstruction. Normal appendix. 2. Segmental thickening of the gastric antrum which may be related to underdistention. Gastritis or underlying infiltrative process is less likely but not excluded. Correlation with clinical exam is recommended. Endoscopy may provide better evaluation of the stomach. 3. Minimal irregular appearing of the hepatic contour as seen on the prior CTs. Correlation with clinical exam and LFTs recommended. 4. Aortoiliac atherosclerotic disease.  ____________________________________________   INITIAL IMPRESSION / ASSESSMENT AND PLAN / ED COURSE  Pertinent labs & imaging results that were available during my care of the patient were reviewed by me and considered in my medical decision making (see chart for details).  The patient presents the emergency department up her abdominal pain nausea and vomiting. This appears to be a recurrent issue for the patient although she states this episode is more severe than her typical episodes. We will check labs, we'll treat with 1 dose of pain medication, antiemetics, IV fluids and closely monitor.  CT the abdomen consistent with significant colitis with diarrhea. I have ordered a stool culture as  well as C. difficile test. We will place on enteric precautions. Patient continues to be significantly nauseated with several episodes of dry heaving in the emergency department. Patient would not be able to tolerate oral antibiotics on an outpatient basis. Asian continues to be in significant discomfort. We will continue with IV medications including IV antibiotics and admitted to the hospital while awaiting stool results. ____________________________________________   FINAL CLINICAL IMPRESSION(S) / ED DIAGNOSES  Epigastric pain Colitis   Minna Antis, MD 08/11/16 2238

## 2016-08-12 DIAGNOSIS — K529 Noninfective gastroenteritis and colitis, unspecified: Principal | ICD-10-CM

## 2016-08-12 DIAGNOSIS — R112 Nausea with vomiting, unspecified: Secondary | ICD-10-CM

## 2016-08-12 LAB — PHOSPHORUS: Phosphorus: 3.5 mg/dL (ref 2.5–4.6)

## 2016-08-12 LAB — COMPREHENSIVE METABOLIC PANEL
ALK PHOS: 72 U/L (ref 38–126)
ALT: 21 U/L (ref 14–54)
AST: 21 U/L (ref 15–41)
Albumin: 3.7 g/dL (ref 3.5–5.0)
Anion gap: 6 (ref 5–15)
BILIRUBIN TOTAL: 1.1 mg/dL (ref 0.3–1.2)
BUN: 10 mg/dL (ref 6–20)
CHLORIDE: 108 mmol/L (ref 101–111)
CO2: 26 mmol/L (ref 22–32)
CREATININE: 0.65 mg/dL (ref 0.44–1.00)
Calcium: 8.5 mg/dL — ABNORMAL LOW (ref 8.9–10.3)
GFR calc Af Amer: >60 mL/min (ref >60)
Glucose, Bld: 94 mg/dL (ref 65–99)
Potassium: 3.1 mmol/L — ABNORMAL LOW (ref 3.5–5.1)
Sodium: 140 mmol/L (ref 135–145)
Total Protein: 6.4 g/dL — ABNORMAL LOW (ref 6.5–8.1)

## 2016-08-12 LAB — CBC
HEMATOCRIT: 39.1 % (ref 35.0–47.0)
HEMOGLOBIN: 13.2 g/dL (ref 12.0–16.0)
MCH: 28.8 pg (ref 26.0–34.0)
MCHC: 33.8 g/dL (ref 32.0–36.0)
MCV: 85.4 fL (ref 80.0–100.0)
PLATELETS: 416 10*3/uL (ref 150–440)
RBC: 4.58 MIL/uL (ref 3.80–5.20)
RDW: 14.1 % (ref 11.5–14.5)
WBC: 12.8 10*3/uL — AB (ref 3.6–11.0)

## 2016-08-12 LAB — GASTROINTESTINAL PANEL BY PCR, STOOL (REPLACES STOOL CULTURE)
ADENOVIRUS F40/41: NOT DETECTED
ASTROVIRUS: NOT DETECTED
CAMPYLOBACTER SPECIES: NOT DETECTED
Cryptosporidium: NOT DETECTED
Cyclospora cayetanensis: NOT DETECTED
ENTEROPATHOGENIC E COLI (EPEC): NOT DETECTED
ENTEROTOXIGENIC E COLI (ETEC): NOT DETECTED
Entamoeba histolytica: NOT DETECTED
Enteroaggregative E coli (EAEC): NOT DETECTED
Giardia lamblia: NOT DETECTED
NOROVIRUS GI/GII: NOT DETECTED
PLESIMONAS SHIGELLOIDES: NOT DETECTED
ROTAVIRUS A: NOT DETECTED
SAPOVIRUS (I, II, IV, AND V): NOT DETECTED
SHIGA LIKE TOXIN PRODUCING E COLI (STEC): NOT DETECTED
Salmonella species: NOT DETECTED
Shigella/Enteroinvasive E coli (EIEC): NOT DETECTED
VIBRIO SPECIES: NOT DETECTED
Vibrio cholerae: NOT DETECTED
Yersinia enterocolitica: NOT DETECTED

## 2016-08-12 LAB — C DIFFICILE QUICK SCREEN W PCR REFLEX
C DIFFICILE (CDIFF) INTERP: NOT DETECTED
C DIFFICILE (CDIFF) TOXIN: NEGATIVE
C DIFFICLE (CDIFF) ANTIGEN: NEGATIVE

## 2016-08-12 LAB — MAGNESIUM: Magnesium: 1.9 mg/dL (ref 1.7–2.4)

## 2016-08-12 MED ORDER — SODIUM CHLORIDE 0.9 % IV SOLN
INTRAVENOUS | Status: DC
  Administered 2016-08-12 – 2016-08-14 (×6): via INTRAVENOUS
  Filled 2016-08-12 (×7): qty 1000

## 2016-08-12 MED ORDER — INSULIN ASPART 100 UNIT/ML ~~LOC~~ SOLN: 0.0000 [IU] | SUBCUTANEOUS | Status: DC

## 2016-08-12 MED ORDER — ENOXAPARIN SODIUM 40 MG/0.4ML ~~LOC~~ SOLN
40.0000 mg | SUBCUTANEOUS | Status: DC
  Administered 2016-08-12: 40 mg via SUBCUTANEOUS
  Filled 2016-08-12 (×2): qty 0.4

## 2016-08-12 MED ORDER — SUCRALFATE 1 G PO TABS
1.0000 g | ORAL_TABLET | Freq: Three times a day (TID) | ORAL | Status: DC
  Administered 2016-08-12 (×4): 1 g via ORAL
  Filled 2016-08-12 (×4): qty 1

## 2016-08-12 MED ORDER — ONDANSETRON HCL 4 MG PO TABS: 4.0000 mg | ORAL_TABLET | Freq: Four times a day (QID) | ORAL | Status: DC | PRN

## 2016-08-12 MED ORDER — SENNOSIDES-DOCUSATE SODIUM 8.6-50 MG PO TABS: 1.0000 | ORAL_TABLET | Freq: Every evening | ORAL | Status: DC | PRN

## 2016-08-12 MED ORDER — ACETAMINOPHEN 325 MG PO TABS: 650.0000 mg | ORAL_TABLET | Freq: Four times a day (QID) | ORAL | Status: DC | PRN

## 2016-08-12 MED ORDER — METRONIDAZOLE IN NACL 5-0.79 MG/ML-% IV SOLN
500.0000 mg | Freq: Three times a day (TID) | INTRAVENOUS | Status: DC
  Administered 2016-08-12: 500 mg via INTRAVENOUS
  Filled 2016-08-12 (×2): qty 100

## 2016-08-12 MED ORDER — HYDROMORPHONE HCL 1 MG/ML IJ SOLN
0.5000 mg | INTRAMUSCULAR | Status: DC | PRN
Start: 2016-08-12 — End: 2016-08-14
  Administered 2016-08-12 – 2016-08-14 (×12): 0.5 mg via INTRAVENOUS
  Filled 2016-08-12 (×11): qty 0.5

## 2016-08-12 MED ORDER — CIPROFLOXACIN IN D5W 400 MG/200ML IV SOLN
400.0000 mg | Freq: Two times a day (BID) | INTRAVENOUS | Status: DC
  Filled 2016-08-12: qty 200

## 2016-08-12 MED ORDER — SODIUM CHLORIDE 0.9 % IV SOLN
INTRAVENOUS | Status: DC
  Administered 2016-08-12: 02:00:00 via INTRAVENOUS

## 2016-08-12 MED ORDER — MAGNESIUM CITRATE PO SOLN
1.0000 | Freq: Once | ORAL | Status: DC | PRN
  Filled 2016-08-12: qty 296

## 2016-08-12 MED ORDER — PANTOPRAZOLE SODIUM 40 MG PO TBEC
40.0000 mg | DELAYED_RELEASE_TABLET | Freq: Two times a day (BID) | ORAL | Status: DC
  Administered 2016-08-12 – 2016-08-14 (×4): 40 mg via ORAL
  Filled 2016-08-12 (×5): qty 1

## 2016-08-12 MED ORDER — ONDANSETRON HCL 4 MG/2ML IJ SOLN
4.0000 mg | Freq: Four times a day (QID) | INTRAMUSCULAR | Status: DC | PRN
  Administered 2016-08-12 – 2016-08-13 (×6): 4 mg via INTRAVENOUS
  Filled 2016-08-12 (×6): qty 2

## 2016-08-12 MED ORDER — ACETAMINOPHEN 650 MG RE SUPP: 650.0000 mg | Freq: Four times a day (QID) | RECTAL | Status: DC | PRN

## 2016-08-12 MED ORDER — ALBUTEROL SULFATE (2.5 MG/3ML) 0.083% IN NEBU: 2.5000 mg | INHALATION_SOLUTION | Freq: Four times a day (QID) | RESPIRATORY_TRACT | Status: DC | PRN

## 2016-08-12 MED ORDER — OXYCODONE HCL 5 MG PO TABS
5.0000 mg | ORAL_TABLET | Freq: Four times a day (QID) | ORAL | Status: DC | PRN
  Administered 2016-08-12 – 2016-08-14 (×4): 5 mg via ORAL
  Filled 2016-08-12 (×5): qty 1

## 2016-08-12 MED ORDER — IPRATROPIUM BROMIDE 0.02 % IN SOLN: 0.5000 mg | Freq: Four times a day (QID) | RESPIRATORY_TRACT | Status: DC | PRN

## 2016-08-12 MED ORDER — BISACODYL 5 MG PO TBEC: 5.0000 mg | DELAYED_RELEASE_TABLET | Freq: Every day | ORAL | Status: DC | PRN

## 2016-08-12 MED ORDER — POTASSIUM CHLORIDE IN NACL 20-0.9 MEQ/L-% IV SOLN: INTRAVENOUS | Status: DC

## 2016-08-12 MED ORDER — SODIUM CHLORIDE 0.9 % IV SOLN
INTRAVENOUS | Status: DC
  Administered 2016-08-13: 11:00:00 via INTRAVENOUS

## 2016-08-12 MED ORDER — PEG 3350-KCL-NA BICARB-NACL 420 G PO SOLR
4000.0000 mL | Freq: Once | ORAL | Status: AC
  Administered 2016-08-12: 4000 mL via ORAL
  Filled 2016-08-12: qty 4000

## 2016-08-12 MED ORDER — RISAQUAD PO CAPS
2.0000 | ORAL_CAPSULE | Freq: Three times a day (TID) | ORAL | Status: DC
  Administered 2016-08-12 – 2016-08-14 (×6): 2 via ORAL
  Filled 2016-08-12 (×7): qty 2

## 2016-08-12 NOTE — Progress Notes (Signed)
Patient ID: Lynn Larson, female   DOB: Dec 21, 1965, 51 y.o.   MRN: [id number]  [employer] PROGRESS NOTE  Lynn Larson WUJ:[id number] DOB: 01-28-1966 DOA: 08/11/2016 PCP: No PCP Per Patient  HPI/Subjective: Patient having diarrhea and abdominal pain. Numerous admissions for this in the past.  Last admission positive for norovirus.  Objective: Vitals:   08/12/16 0506 08/12/16 0933  BP: 116/73 122/61  Pulse: 65 67  Resp: 18 18  Temp: 98.2 F (36.8 C) 98.2 F (36.8 C)    Filed Weights   08/11/16 1733 08/12/16 0155  Weight: 72.6 kg (160 lb) 69.9 kg (154 lb)    ROS: Review of Systems  Constitutional: Negative for chills and fever.  Eyes: Negative for blurred vision.  Respiratory: Negative for cough and shortness of breath.   Cardiovascular: Negative for chest pain.  Gastrointestinal: Positive for abdominal pain, diarrhea and nausea. Negative for constipation and vomiting.  Genitourinary: Negative for dysuria.  Musculoskeletal: Negative for joint pain.  Neurological: Negative for dizziness and headaches.   Exam: Physical Exam  Constitutional: She is oriented to person, place, and time.  HENT:  Nose: No mucosal edema.  Mouth/Throat: No oropharyngeal exudate or posterior oropharyngeal edema.  Eyes: Conjunctivae, EOM and lids are normal. Pupils are equal, round, and reactive to light.  Neck: No JVD present. Carotid bruit is not present. No edema present. No thyroid mass and no thyromegaly present.  Cardiovascular: S1 normal and S2 normal.  Exam reveals no gallop.   No murmur heard. Pulses:      Dorsalis pedis pulses are 2+ on the right side, and 2+ on the left side.  Respiratory: No respiratory distress. She has no wheezes. She has no rhonchi. She has no rales.  GI: Soft. Bowel sounds are normal. There is tenderness.  Musculoskeletal:       Right shoulder: She exhibits no swelling.  Lymphadenopathy:    She has no cervical adenopathy.  Neurological: She is alert and  oriented to person, place, and time. No cranial nerve deficit.  Skin: Skin is warm. No rash noted. Nails show no clubbing.  Psychiatric: She has a normal mood and affect.      Data Reviewed: Basic Metabolic Panel:  Recent Labs Lab 08/09/16 1706 08/11/16 1740 08/12/16 0441  NA 141 134* 140  K 3.9 3.3* 3.1*  CL 104 99* 108  CO2 26 23 26   GLUCOSE 129* 116* 94  BUN 15 13 10   CREATININE 0.74 0.85 0.65  CALCIUM 9.8 10.7* 8.5*  MG  --   --  1.9  PHOS  --   --  3.5   Liver Function Tests:  Recent Labs Lab 08/09/16 1706 08/11/16 1740 08/12/16 0441  AST 28 32 21  ALT 30 28 21   ALKPHOS 99 100 72  BILITOT 0.6 1.5* 1.1  PROT 8.8* 9.0* 6.4*  ALBUMIN 4.8 5.2* 3.7    Recent Labs Lab 08/09/16 1706 08/11/16 1740  LIPASE 26 48   CBC:  Recent Labs Lab 08/09/16 1706 08/11/16 1740 08/12/16 0441  WBC 20.7* 23.9* 12.8*  NEUTROABS 17.0* 15.6*  --   HGB 15.3 15.9 13.2  HCT 43.9 46.3 39.1  MCV 85.5 84.0 85.4  PLT 527* 567* 416     Recent Results (from the past 240 hour(s))  Gastrointestinal Panel by PCR , Stool     Status: None   Collection Time: 08/11/16 11:38 PM  Result Value Ref Range Status   Campylobacter species NOT DETECTED NOT DETECTED Final   Plesimonas shigelloides  NOT DETECTED NOT DETECTED Final   Salmonella species NOT DETECTED NOT DETECTED Final   Yersinia enterocolitica NOT DETECTED NOT DETECTED Final   Vibrio species NOT DETECTED NOT DETECTED Final   Vibrio cholerae NOT DETECTED NOT DETECTED Final   Enteroaggregative E coli (EAEC) NOT DETECTED NOT DETECTED Final   Enteropathogenic E coli (EPEC) NOT DETECTED NOT DETECTED Final   Enterotoxigenic E coli (ETEC) NOT DETECTED NOT DETECTED Final   Shiga like toxin producing E coli (STEC) NOT DETECTED NOT DETECTED Final   Shigella/Enteroinvasive E coli (EIEC) NOT DETECTED NOT DETECTED Final   Cryptosporidium NOT DETECTED NOT DETECTED Final   Cyclospora cayetanensis NOT DETECTED NOT DETECTED Final    Entamoeba histolytica NOT DETECTED NOT DETECTED Final   Giardia lamblia NOT DETECTED NOT DETECTED Final   Adenovirus F40/41 NOT DETECTED NOT DETECTED Final   Astrovirus NOT DETECTED NOT DETECTED Final   Norovirus GI/GII NOT DETECTED NOT DETECTED Final   Rotavirus A NOT DETECTED NOT DETECTED Final   Sapovirus (I, II, IV, and V) NOT DETECTED NOT DETECTED Final  C difficile quick scan w PCR reflex     Status: None   Collection Time: 08/11/16 11:38 PM  Result Value Ref Range Status   C Diff antigen NEGATIVE NEGATIVE Final   C Diff toxin NEGATIVE NEGATIVE Final   C Diff interpretation No C. difficile detected.  Final     Studies: Ct Abdomen Pelvis W Contrast  Result Date: 08/11/2016 CLINICAL DATA:  51 year old female with upper abdominal pain, nausea and vomiting. EXAM: CT ABDOMEN AND PELVIS WITH CONTRAST TECHNIQUE: Multidetector CT imaging of the abdomen and pelvis was performed using the standard protocol following bolus administration of intravenous contrast. CONTRAST:  100mL ISOVUE-300 IOPAMIDOL (ISOVUE-300) INJECTION 61% COMPARISON:  Abdominal CT dated 07/27/2016 and 01/16/2016 FINDINGS: Lower chest: The visualized lung bases are clear. No intra-abdominal free air or free fluid. Hepatobiliary: There is minimal irregularity of the hepatic contour as seen on the prior CTs. The liver is otherwise unremarkable. No intrahepatic biliary ductal dilatation. Cholecystectomy. Pancreas: Unremarkable. No pancreatic ductal dilatation or surrounding inflammatory changes. Spleen: Normal in size without focal abnormality. Adrenals/Urinary Tract: The adrenal glands appear unremarkable. Subcentimeter right renal hypodense lesions are too small to characterize but appear similar to prior CT and most likely represent cysts. There is no hydronephrosis on either side. The visualized ureters and urinary bladder appear unremarkable. Stomach/Bowel: There is persistent diffuse thickened appearance of the colon concerning  for colitis. Findings may be infectious in etiology or related to underlying inflammatory bowel disease. Correlation with clinical exam and stool cultures recommended. There is loose stool within the colon compatible with diarrheal state. There is segmental thickened appearance of the gastric antrum which although may be related to underdistention, gastritis is not excluded. Correlation with clinical exam recommended. There is slight thickened appearance of this portion of the stomach on multiple prior CTs. Further evaluation with endoscopy is recommended to exclude an infiltrative process. There is no evidence of bowel obstruction. An appendicolith is noted within the appendix. The appendix is otherwise unremarkable. Vascular/Lymphatic: There is mild moderate aortoiliac atherosclerotic disease predominantly involving distal aorta and common iliac arteries. The origins of the celiac axis, SMA, IMA as well as the origins of the renal arteries are patent. The SMV, splenic vein, and main portal vein are patent. No portal venous gas identified. There is no adenopathy. Reproductive: The uterus is anteverted and grossly unremarkable. The ovaries appear unremarkable as well. Other: None Musculoskeletal: Mild degenerative changes of  the spine. No acute fracture. IMPRESSION: 1. Findings concerning for colitis with diarrheal state. Correlation with clinical exam and stool cultures recommended. No bowel obstruction. Normal appendix. 2. Segmental thickening of the gastric antrum which may be related to underdistention. Gastritis or underlying infiltrative process is less likely but not excluded. Correlation with clinical exam is recommended. Endoscopy may provide better evaluation of the stomach. 3. Minimal irregular appearing of the hepatic contour as seen on the prior CTs. Correlation with clinical exam and LFTs recommended. 4. Aortoiliac atherosclerotic disease. Electronically Signed   By: Elgie Collard M.D.   On:  08/11/2016 21:26    Scheduled Meds: . acidophilus  2 capsule Oral TID  . enoxaparin (LOVENOX) injection  40 mg Subcutaneous Q24H  . pantoprazole  40 mg Oral BID  . polyethylene glycol-electrolytes  4,000 mL Oral Once  . sucralfate  1 g Oral TID WC & HS   Continuous Infusions: . sodium chloride    . 0.9% NaCl with KCL 20 mEq/L infusion 75 mL/hr at 08/12/16 1204    Assessment/Plan:  1. Acute colitis. Recent infection with norovirus. Antibiotics stopped. Stool studies negative. Could be cannabis hyperemesis syndrome. GI to do endoscopy and colonoscopy tomorrow. 2. Hypokalemia replace potassium in IV fluids 3. Leukocytosis secondary to nausea vomiting this has improved with IV fluid hydration 4. Thrombocytosis improved with IV fluid hydration likely reactive from nausea vomiting. 5. Hypercalcemia on presentation secondary to dehydration.  Code Status:     Code Status Orders        Start     Ordered   08/12/16 0145  Full code  Continuous     08/12/16 0144    Code Status History    Date Active Date Inactive Code Status Order ID Comments User Context   08/01/2016 12:39 PM 08/02/2016  9:00 PM Full Code [id number]  Enid Baas, MD Inpatient   07/28/2016  2:28 PM 07/30/2016  8:24 PM Full Code [id number]  Alford Highland, MD ED   07/22/2016 11:48 PM 07/24/2016  5:59 PM Full Code [id number]  Oralia Manis, MD Inpatient   01/16/2016  9:33 PM 01/20/2016  3:07 PM Full Code [id number]  Houston Siren, MD ED   08/14/2015  5:33 PM 08/17/2015  2:44 PM Full Code [id number]  [name], MD Inpatient   02/15/2015  8:31 PM 02/18/2015 10:31 PM Full Code [id number]  Katha Hamming, MD ED     Disposition Plan: To be determined based on clinical course  Consultants:  Gastroneurology  Time spent: 35 minutes  Alford Highland  [employer]

## 2016-08-12 NOTE — Consult Note (Addendum)
Wyline Mood MD  [address] [place], [place] [address] Phone: [phone] Fax : [phone]  Consultation  Referring Provider:   Dr Shelby Mattocks Primary Care Physician:  No PCP Per Patient Primary Gastroenterologist:  Dr. Marva Panda         Reason for Consultation:     Colitis.   Date of Admission:  08/11/2016 Date of Consultation:  08/12/2016         HPI:   Lynn Larson is a 51 y.o. female who has had multiple hospitalizations for abdominal pains in the past along with nausea and vomiting . She was discharged 2 days back with similar issues . She presented to the ER with abdominal pain . WCC of 23 thousand and elevated platelet count. CT scan of the abdomen showed thickening of the colon that is diffuse suggestive of colitis. In addition thickening of the gastric antrum is seen and possible cirrhosis. At CT scan 16 days, 07/21/16 ,  back showed no significant colitis. Thickening was seen in 12/2015 , 04/2015. In 07/2015 she underwent an EGD with Dr Marva Panda who noted esophagitis and gastritis.   She also has a prior history of asthma, cyclical vomiting syndrome and gastroparesis. She tested positive for Norovirus during her admission on 08/01/16. Stool for C diff and Pcr was negative.   She says she smokes marijuana a few times a week , last few weeks having 4-5 episodes of non bloody diarrhea, vomiting , weight loss. Denies any smoking cigarettes, use of NSAID's. No vomiting presently but still has diarrhea. She says she didn't follow up as an out patient as she does not have insurance.    Past Medical History:  Diagnosis Date  . Asthma   . Diabetes (HCC)   . Hypertension     Past Surgical History:  Procedure Laterality Date  . CHOLECYSTECTOMY    . ESOPHAGOGASTRODUODENOSCOPY (EGD) WITH PROPOFOL [name] 08/15/2015   Procedure: ESOPHAGOGASTRODUODENOSCOPY (EGD) WITH PROPOFOL;  Surgeon: Christena Deem, MD;  Location: [facility] ENDOSCOPY;  Service: Endoscopy;  Laterality: [name];    Prior to Admission  medications   Medication Sig Start Date End Date Taking? Authorizing Provider  acetaminophen (TYLENOL) 325 MG tablet Take 2 tablets (650 mg total) by mouth every 6 (six) hours as needed for mild pain (or Fever >/= 101). 07/24/16  Yes Ramonita Lab, MD  alum & mag hydroxide-simeth (MAALOX/MYLANTA) 200-200-20 MG/5ML suspension Take 15-30 mLs by mouth 2 (two) times daily. 2 tablespoons in the morning and 1 tablespoon in the evening.   Yes Historical Provider, MD  loperamide (IMODIUM A-D) 2 MG tablet Take 1 tablet (2 mg total) by mouth 4 (four) times daily as needed for diarrhea or loose stools. 08/02/16  Yes Houston Siren, MD  metoCLOPramide (REGLAN) 5 MG tablet Take 1 tablet (5 mg total) by mouth 3 (three) times daily before meals. 01/20/16  Yes Enid Baas, MD  ondansetron (ZOFRAN) 4 MG tablet Take 1 tablet (4 mg total) by mouth every 8 (eight) hours as needed for nausea or vomiting. 07/24/16 07/24/17 Yes Ramonita Lab, MD  pantoprazole (PROTONIX) 40 MG tablet Take 1 tablet (40 mg total) by mouth 2 (two) times daily. 01/20/16  Yes Enid Baas, MD  sucralfate (CARAFATE) 1 g tablet Take 1 tablet (1 g total) by mouth 4 (four) times daily -  with meals and at bedtime. 07/24/16  Yes Ramonita Lab, MD  prochlorperazine (COMPAZINE) 10 MG tablet Take 1 tablet (10 mg total) by mouth every 8 (eight) hours as needed (headache). Patient  not taking: Reported on 08/11/2016 08/09/16   Phineas SemenGraydon Goodman, MD  promethazine (PHENERGAN) 25 MG suppository Place 1 suppository (25 mg total) rectally every 6 (six) hours as needed for nausea. Patient not taking: Reported on 08/11/2016 08/09/16 08/09/17  Phineas SemenGraydon Goodman, MD    Family History  Problem Relation Age of Onset  . Breast cancer Maternal Grandmother   . Ovarian cancer Maternal Grandmother      Social History  Substance Use Topics  . Smoking status: Former Smoker    Packs/day: 0.50    Years: 30.00    Types: Cigarettes  . Smokeless tobacco: Never Used  . Alcohol use  No    Allergies as of 08/11/2016 - Review Complete 08/11/2016  Allergen Reaction Noted  . Chantix [varenicline]  08/14/2015  . Morphine and related Other (See Comments) 02/04/2015    Review of Systems:    All systems reviewed and negative except where noted in HPI.   Physical Exam:  Vital signs in last 24 hours: Temp:  [97.9 F (36.6 C)-98.9 F (37.2 C)] 98.2 F (36.8 C) (02/20 0506) Pulse Rate:  [65-108] 65 (02/20 0506) Resp:  [8-27] 18 (02/20 0506) BP: (112-143)/(60-108) 116/73 (02/20 0506) SpO2:  [92 %-98 %] 92 % (02/20 0506) Weight:  [154 lb (69.9 kg)-160 lb (72.6 kg)] 154 lb (69.9 kg) (02/20 0155) Last BM Date: 08/12/16 General:   Pleasant, cooperative in NAD Head:  Normocephalic and atraumatic. Eyes:   No icterus.   Conjunctiva pink. PERRLA. Ears:  Normal auditory acuity. Neck:  Supple; no masses or thyroidomegaly Lungs: Respirations even and unlabored. Lungs clear to auscultation bilaterally.   No wheezes, crackles, or rhonchi.  Heart:  Regular rate and rhythm;  Without murmur, clicks, rubs or gallops Abdomen:  Soft, nondistended, nontender. Normal bowel sounds. No appreciable masses or hepatomegaly.  No rebound or guarding.  Rectal:  Not performed. Neurologic:  Alert and oriented x3;  grossly normal neurologically. Skin:  Intact without significant lesions or rashes. Cervical Nodes:  No significant cervical adenopathy. Psych:  Alert and cooperative. Normal affect.  LAB RESULTS:  Recent Labs  08/09/16 1706 08/11/16 1740 08/12/16 0441  WBC 20.7* 23.9* 12.8*  HGB 15.3 15.9 13.2  HCT 43.9 46.3 39.1  PLT 527* 567* 416   BMET  Recent Labs  08/09/16 1706 08/11/16 1740 08/12/16 0441  NA 141 134* 140  K 3.9 3.3* 3.1*  CL 104 99* 108  CO2 26 23 26   GLUCOSE 129* 116* 94  BUN 15 13 10   CREATININE 0.74 0.85 0.65  CALCIUM 9.8 10.7* 8.5*   LFT  Recent Labs  08/12/16 0441  PROT 6.4*  ALBUMIN 3.7  AST 21  ALT 21  ALKPHOS 72  BILITOT 1.1    PT/INR No results for input(s): LABPROT, INR in the last 72 hours.  STUDIES: Ct Abdomen Pelvis W Contrast  Result Date: 08/11/2016 CLINICAL DATA:  51 year old female with upper abdominal pain, nausea and vomiting. EXAM: CT ABDOMEN AND PELVIS WITH CONTRAST TECHNIQUE: Multidetector CT imaging of the abdomen and pelvis was performed using the standard protocol following bolus administration of intravenous contrast. CONTRAST:  100mL ISOVUE-300 IOPAMIDOL (ISOVUE-300) INJECTION 61% COMPARISON:  Abdominal CT dated 07/27/2016 and 01/16/2016 FINDINGS: Lower chest: The visualized lung bases are clear. No intra-abdominal free air or free fluid. Hepatobiliary: There is minimal irregularity of the hepatic contour as seen on the prior CTs. The liver is otherwise unremarkable. No intrahepatic biliary ductal dilatation. Cholecystectomy. Pancreas: Unremarkable. No pancreatic ductal dilatation or surrounding inflammatory changes.  Spleen: Normal in size without focal abnormality. Adrenals/Urinary Tract: The adrenal glands appear unremarkable. Subcentimeter right renal hypodense lesions are too small to characterize but appear similar to prior CT and most likely represent cysts. There is no hydronephrosis on either side. The visualized ureters and urinary bladder appear unremarkable. Stomach/Bowel: There is persistent diffuse thickened appearance of the colon concerning for colitis. Findings may be infectious in etiology or related to underlying inflammatory bowel disease. Correlation with clinical exam and stool cultures recommended. There is loose stool within the colon compatible with diarrheal state. There is segmental thickened appearance of the gastric antrum which although may be related to underdistention, gastritis is not excluded. Correlation with clinical exam recommended. There is slight thickened appearance of this portion of the stomach on multiple prior CTs. Further evaluation with endoscopy is recommended to  exclude an infiltrative process. There is no evidence of bowel obstruction. An appendicolith is noted within the appendix. The appendix is otherwise unremarkable. Vascular/Lymphatic: There is mild moderate aortoiliac atherosclerotic disease predominantly involving distal aorta and common iliac arteries. The origins of the celiac axis, SMA, IMA as well as the origins of the renal arteries are patent. The SMV, splenic vein, and main portal vein are patent. No portal venous gas identified. There is no adenopathy. Reproductive: The uterus is anteverted and grossly unremarkable. The ovaries appear unremarkable as well. Other: None Musculoskeletal: Mild degenerative changes of the spine. No acute fracture. IMPRESSION: 1. Findings concerning for colitis with diarrheal state. Correlation with clinical exam and stool cultures recommended. No bowel obstruction. Normal appendix. 2. Segmental thickening of the gastric antrum which may be related to underdistention. Gastritis or underlying infiltrative process is less likely but not excluded. Correlation with clinical exam is recommended. Endoscopy may provide better evaluation of the stomach. 3. Minimal irregular appearing of the hepatic contour as seen on the prior CTs. Correlation with clinical exam and LFTs recommended. 4. Aortoiliac atherosclerotic disease. Electronically Signed   By: Elgie Collard M.D.   On: 08/11/2016 21:26      Impression / Plan:   Lynn Larson is a 51 y.o. 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    Thank you for  involving me in the care of this patient.      LOS: 0 days   Wyline Mood, MD  08/12/2016, 9:21 AM

## 2016-08-12 NOTE — Evaluation (Signed)
Physical Therapy Evaluation Patient Details Name: Paul Torpey MRN: [id number] DOB: 1965-09-23 Today's Date: 08/12/2016   History of Present Illness  51 y.o. female with a known history of asthma, diabetes, hypertension, multiple recent emergency department visits and hospitalizations for similar symptoms including norovirus  was in a usual state of health until early January 2018 when she developed abdominal pain, cramping. She has had multiple emergency department visits and hospitalizations for her pain which has been associated with intractable nausea and vomiting. She comes in the hospital 2 days ago for similar symptoms and was discharged home with prescriptions for Compazine and Phenergan which she did not get filled. She returns today complaining of severe upper abdominal pain which is described as sharp, burning, cramping and associated with nausea, intractable vomiting, diarrhea, fatigue and decreased by mouth intake  Clinical Impression  Pt did well with PT exam and showed no safety or other concerns. She is confident with mobility and ultimately does not need any further PT intervention.  Pt safe to return home once medically cleared.    Follow Up Recommendations No PT follow up    Equipment Recommendations       Recommendations for Other Services       Precautions / Restrictions Precautions Precautions: None Restrictions Weight Bearing Restrictions: No      Mobility  Bed Mobility Overal bed mobility: Independent                Transfers Overall transfer level: Independent Equipment used: None             General transfer comment: Pt able to rise to standing and maintain balance w/o issue  Ambulation/Gait Ambulation/Gait assistance: Independent Ambulation Distance (Feet): 60 Feet Assistive device: None       General Gait Details: Pt walks with good confidence and had no safety issues.   Stairs            Wheelchair Mobility    Modified  Rankin (Stroke Patients Only)       Balance Overall balance assessment: Independent                                           Pertinent Vitals/Pain Pain Assessment:  (general abdominal pain)    Home Living Family/patient expects to be discharged to:: Private residence Living Arrangements: Spouse/significant other                    Prior Function Level of Independence: Independent         Comments: Pt able to work and be active, no issues`     Hand Dominance        Extremity/Trunk Assessment   Upper Extremity Assessment Upper Extremity Assessment: Overall WFL for tasks assessed    Lower Extremity Assessment Lower Extremity Assessment: Overall WFL for tasks assessed       Communication   Communication: No difficulties  Cognition Arousal/Alertness: Awake/alert Behavior During Therapy: WFL for tasks assessed/performed Overall Cognitive Status: Within Functional Limits for tasks assessed                      General Comments      Exercises     Assessment/Plan    PT Assessment Patent does not need any further PT services  PT Problem List         PT Treatment Interventions  PT Goals (Current goals can be found in the Care Plan section)  Acute Rehab PT Goals Patient Stated Goal: go home    Frequency     Barriers to discharge        Co-evaluation               End of Session   Activity Tolerance: Patient tolerated treatment well Patient left: in bed;with call bell/phone within reach   PT Visit Diagnosis: Muscle weakness (generalized) (M62.81)         Time: [id number] PT Time Calculation (min) (ACUTE ONLY): 19 min   Charges:   PT Evaluation $PT Eval Low Complexity: 1 Procedure     PT G CodesMalachi Pro:         Jayley Hustead R Celvin Taney, DPT 08/12/2016, 12:13 PM

## 2016-08-12 NOTE — H&P (Signed)
History and Physical   [facility] - [place] @ [facility] Admission History and Physical [employer], D.O.    Patient Name: Lynn Larson MR#: [id number] Date of Birth: September 09, 1965 Date of Admission: 08/11/2016  Referring MD/NP/PA: Dr. Lenard Lance Primary Care Physician: No PCP Per Patient Outpatient Specialists:   Patient coming from: Home  Chief Complaint: Abdominal pain  HPI: Lynn Larson is a 51 y.o. female with a known history of asthma, diabetes, hypertension, multiple recent emergency department visits and hospitalizations for similar symptoms including norovirus  was in a usual state of health until early January 2018 when she developed abdominal pain, cramping. She has had multiple emergency department visits and hospitalizations for her pain which has been associated with intractable nausea and vomiting. She comes in the hospital 2 days ago for similar symptoms and was discharged home with prescriptions for Compazine and Phenergan which she did not get filled. She returns today complaining of severe upper abdominal pain which is described as sharp, burning, cramping and associated with nausea, intractable vomiting, diarrhea, fatigue and decreased by mouth intake..   Otherwise there has been no change in status. Patient has been taking medication as prescribed and there has been no recent change in medication or diet.  No recent antibiotics.   Patient denies fevers/chills, weakness, dizziness, chest pain, shortness of breath, dysuria/frequency, changes in mental status.   ED Course: Patient received Cipro, Flagyl, IV fluids, Dilaudid, GI cocktail, Zofran, Phenergan  Review of Systems:  CONSTITUTIONAL: No fever/chills, weight gain/loss, headache. Positive fatigue and weakness EYES: No blurry or double vision. ENT: No tinnitus, postnasal drip, redness or soreness of the oropharynx. RESPIRATORY: No cough, dyspnea, wheeze.  No hemoptysis.  CARDIOVASCULAR: No chest pain,  palpitations, syncope, orthopnea. No lower extremity edema.  GASTROINTESTINAL: Positive nausea, vomiting, abdominal pain, diarrhea, negative constipation.  No hematemesis, melena or hematochezia. GENITOURINARY: No dysuria, frequency, hematuria. ENDOCRINE: No polyuria or nocturia. No heat or cold intolerance. HEMATOLOGY: No anemia, bruising, bleeding. INTEGUMENTARY: No rashes, ulcers, lesions. MUSCULOSKELETAL: No arthritis, gout, dyspnea. NEUROLOGIC: No numbness, tingling, ataxia, seizure-type activity, weakness. PSYCHIATRIC: No anxiety, depression, insomnia.   Past Medical History:  Diagnosis Date  . Asthma   . Diabetes (HCC)   . Hypertension     Past Surgical History:  Procedure Laterality Date  . CHOLECYSTECTOMY    . ESOPHAGOGASTRODUODENOSCOPY (EGD) WITH PROPOFOL [name] 08/15/2015   Procedure: ESOPHAGOGASTRODUODENOSCOPY (EGD) WITH PROPOFOL;  Surgeon: Christena Deem, MD;  Location: [facility] ENDOSCOPY;  Service: Endoscopy;  Laterality: [name];     reports that she has quit smoking. Her smoking use included Cigarettes. She has a 15.00 pack-year smoking history. She has never used smokeless tobacco. She reports that she uses drugs, including Marijuana. She reports that she does not drink alcohol.  Allergies  Allergen Reactions  . Chantix [Varenicline]     Suicidal thoughts  . Morphine And Related Other (See Comments)    Reaction:  Redness of face     Family History  Problem Relation Age of Onset  . Breast cancer Maternal Grandmother   . Ovarian cancer Maternal Grandmother     Prior to Admission medications   Medication Sig Start Date End Date Taking? Authorizing Provider  acetaminophen (TYLENOL) 325 MG tablet Take 2 tablets (650 mg total) by mouth every 6 (six) hours as needed for mild pain (or Fever >/= 101). 07/24/16  Yes Ramonita Lab, MD  alum & mag hydroxide-simeth (MAALOX/MYLANTA) 200-200-20 MG/5ML suspension Take 15-30 mLs by mouth 2 (two) times daily. 2 tablespoons  in the  morning and 1 tablespoon in the evening.   Yes Historical Provider, MD  loperamide (IMODIUM A-D) 2 MG tablet Take 1 tablet (2 mg total) by mouth 4 (four) times daily as needed for diarrhea or loose stools. 08/02/16  Yes Houston SirenVivek J Sainani, MD  metoCLOPramide (REGLAN) 5 MG tablet Take 1 tablet (5 mg total) by mouth 3 (three) times daily before meals. 01/20/16  Yes Enid Baasadhika Kalisetti, MD  ondansetron (ZOFRAN) 4 MG tablet Take 1 tablet (4 mg total) by mouth every 8 (eight) hours as needed for nausea or vomiting. 07/24/16 07/24/17 Yes Ramonita LabAruna Gouru, MD  pantoprazole (PROTONIX) 40 MG tablet Take 1 tablet (40 mg total) by mouth 2 (two) times daily. 01/20/16  Yes Enid Baasadhika Kalisetti, MD  sucralfate (CARAFATE) 1 g tablet Take 1 tablet (1 g total) by mouth 4 (four) times daily -  with meals and at bedtime. 07/24/16  Yes Ramonita LabAruna Gouru, MD  prochlorperazine (COMPAZINE) 10 MG tablet Take 1 tablet (10 mg total) by mouth every 8 (eight) hours as needed (headache). Patient not taking: Reported on 08/11/2016 08/09/16   Phineas SemenGraydon Goodman, MD  promethazine (PHENERGAN) 25 MG suppository Place 1 suppository (25 mg total) rectally every 6 (six) hours as needed for nausea. Patient not taking: Reported on 08/11/2016 08/09/16 08/09/17  Phineas SemenGraydon Goodman, MD    Physical Exam: Vitals:   08/11/16 2000 08/11/16 2101 08/11/16 2130 08/11/16 2200  BP: 118/60 (!) 129/91 115/88 112/81  Pulse: 73 76 76 78  Resp:  16 (!) 8 10  Temp:      TempSrc:      SpO2: 93% 97% 93% 96%  Weight:        GENERAL: 51 y.o.-year-old White female patient, well-developed, well-nourished lying in the bed in no acute distress.  Pleasant and cooperative.   HEENT: Head atraumatic, normocephalic. Pupils equal, round, reactive to light and accommodation. No scleral icterus. Extraocular muscles intact. Nares are patent. Oropharynx is clear. Mucus membranes moist. NECK: Supple, full range of motion. No JVD, no bruit heard. No thyroid enlargement, no tenderness, no cervical  lymphadenopathy. CHEST: Normal breath sounds bilaterally. No wheezing, rales, rhonchi or crackles. No use of accessory muscles of respiration.  No reproducible chest wall tenderness.  CARDIOVASCULAR: S1, S2 normal. No murmurs, rubs, or gallops. Cap refill <2 seconds. Pulses intact distally.  ABDOMEN: Soft, nondistended, mild midepigastric tenderness palpation No rebound, guarding, rigidity. Normoactive bowel sounds present in all four quadrants. No organomegaly or mass. EXTREMITIES: No pedal edema, cyanosis, or clubbing. No calf tenderness or Homan's sign.  NEUROLOGIC: The patient is alert and oriented x 3. Cranial nerves II through XII are grossly intact with no focal sensorimotor deficit. Muscle strength 5/5 in all extremities. Sensation intact. Gait not checked. PSYCHIATRIC:  Normal affect, mood, thought content. SKIN: Warm, dry, and intact without obvious rash, lesion, or ulcer.    Labs on Admission:  CBC:  Recent Labs Lab 08/09/16 1706 08/11/16 1740  WBC 20.7* 23.9*  NEUTROABS 17.0* 15.6*  HGB 15.3 15.9  HCT 43.9 46.3  MCV 85.5 84.0  PLT 527* 567*   Basic Metabolic Panel:  Recent Labs Lab 08/09/16 1706 08/11/16 1740  NA 141 134*  K 3.9 3.3*  CL 104 99*  CO2 26 23  GLUCOSE 129* 116*  BUN 15 13  CREATININE 0.74 0.85  CALCIUM 9.8 10.7*   GFR: Estimated Creatinine Clearance: 73.9 mL/min (by C-G formula based on SCr of 0.85 mg/dL). Liver Function Tests:  Recent Labs Lab 08/09/16 1706 08/11/16  1740  AST 28 32  ALT 30 28  ALKPHOS 99 100  BILITOT 0.6 1.5*  PROT 8.8* 9.0*  ALBUMIN 4.8 5.2*    Recent Labs Lab 08/09/16 1706 08/11/16 1740  LIPASE 26 48   No results for input(s): AMMONIA in the last 168 hours. Coagulation Profile: No results for input(s): INR, PROTIME in the last 168 hours. Cardiac Enzymes: No results for input(s): CKTOTAL, CKMB, CKMBINDEX, TROPONINI in the last 168 hours. BNP (last 3 results) No results for input(s): PROBNP in the last  8760 hours. HbA1C: No results for input(s): HGBA1C in the last 72 hours. CBG: No results for input(s): GLUCAP in the last 168 hours. Lipid Profile: No results for input(s): CHOL, HDL, LDLCALC, TRIG, CHOLHDL, LDLDIRECT in the last 72 hours. Thyroid Function Tests: No results for input(s): TSH, T4TOTAL, FREET4, T3FREE, THYROIDAB in the last 72 hours. Anemia Panel: No results for input(s): VITAMINB12, FOLATE, FERRITIN, TIBC, IRON, RETICCTPCT in the last 72 hours. Urine analysis:    Component Value Date/Time   COLORURINE YELLOW (A) 08/11/2016 1740   APPEARANCEUR HAZY (A) 08/11/2016 1740   APPEARANCEUR Clear 10/22/2013 1222   LABSPEC 1.013 08/11/2016 1740   LABSPEC 1.013 10/22/2013 1222   PHURINE 6.0 08/11/2016 1740   GLUCOSEU NEGATIVE 08/11/2016 1740   GLUCOSEU Negative 10/22/2013 1222   HGBUR MODERATE (A) 08/11/2016 1740   BILIRUBINUR NEGATIVE 08/11/2016 1740   BILIRUBINUR Negative 10/22/2013 1222   KETONESUR NEGATIVE 08/11/2016 1740   PROTEINUR 30 (A) 08/11/2016 1740   NITRITE NEGATIVE 08/11/2016 1740   LEUKOCYTESUR NEGATIVE 08/11/2016 1740   LEUKOCYTESUR Negative 10/22/2013 1222   Sepsis Labs: @LABRCNTIP (procalcitonin:4,lacticidven:4) )No results found for this or any previous visit (from the past 240 hour(s)).   Radiological Exams on Admission: Ct Abdomen Pelvis W Contrast  Result Date: 08/11/2016 CLINICAL DATA:  51 year old female with upper abdominal pain, nausea and vomiting. EXAM: CT ABDOMEN AND PELVIS WITH CONTRAST TECHNIQUE: Multidetector CT imaging of the abdomen and pelvis was performed using the standard protocol following bolus administration of intravenous contrast. CONTRAST:  ISOVUE-300 IOPAMIDOL (ISOVUE-300) INJECTION 61% COMPARISON:  Abdominal CT dated 07/27/2016 and 01/16/2016 FINDINGS: Lower chest: The visualized lung bases are clear. No intra-abdominal free air or free fluid. Hepatobiliary: There is minimal irregularity of the hepatic contour as seen on  the prior CTs. The liver is otherwise unremarkable. No intrahepatic biliary ductal dilatation. Cholecystectomy. Pancreas: Unremarkable. No pancreatic ductal dilatation or surrounding inflammatory changes. Spleen: Normal in size without focal abnormality. Adrenals/Urinary Tract: The adrenal glands appear unremarkable. Subcentimeter right renal hypodense lesions are too small to characterize but appear similar to prior CT and most likely represent cysts. There is no hydronephrosis on either side. The visualized ureters and urinary bladder appear unremarkable. Stomach/Bowel: There is persistent diffuse thickened appearance of the colon concerning for colitis. Findings may be infectious in etiology or related to underlying inflammatory bowel disease. Correlation with clinical exam and stool cultures recommended. There is loose stool within the colon compatible with diarrheal state. There is segmental thickened appearance of the gastric antrum which although may be related to underdistention, gastritis is not excluded. Correlation with clinical exam recommended. There is slight thickened appearance of this portion of the stomach on multiple prior CTs. Further evaluation with endoscopy is recommended to exclude an infiltrative process. There is no evidence of bowel obstruction. An appendicolith is noted within the appendix. The appendix is otherwise unremarkable. Vascular/Lymphatic: There is mild moderate aortoiliac atherosclerotic disease predominantly involving distal aorta and common iliac arteries. The  origins of the celiac axis, SMA, IMA as well as the origins of the renal arteries are patent. The SMV, splenic vein, and main portal vein are patent. No portal venous gas identified. There is no adenopathy. Reproductive: The uterus is anteverted and grossly unremarkable. The ovaries appear unremarkable as well. Other: None Musculoskeletal: Mild degenerative changes of the spine. No acute fracture. IMPRESSION: 1.  Findings concerning for colitis with diarrheal state. Correlation with clinical exam and stool cultures recommended. No bowel obstruction. Normal appendix. 2. Segmental thickening of the gastric antrum which may be related to underdistention. Gastritis or underlying infiltrative process is less likely but not excluded. Correlation with clinical exam is recommended. Endoscopy may provide better evaluation of the stomach. 3. Minimal irregular appearing of the hepatic contour as seen on the prior CTs. Correlation with clinical exam and LFTs recommended. 4. Aortoiliac atherosclerotic disease. Electronically Signed   By: Elgie Collard M.D.   On: 08/11/2016 21:26    EKG: Sinus tachycardia 109 bpm with normal axis and nonspecific ST-T wave changes.   Assessment/Plan  This is a 51 y.o. female with a history of asthma, diabetes, hypertension, multiple recent emergency department visits and hospitalizations for similar symptoms including norovirus now being admitted with:  1. Intractable abdominal pain secondary to Colitis -Admit to inpatient -IV Cipro and Flagyl -Bacid -Pain control, antiemetics, IV fluids -Continue Carafate and Protonix -GI panel, stool Cultures and stool for C. difficile given recent hospitalizations - Check Hpylori -GI consultation for consideration of endoscopy  2. Hypokalemia, mild -Replace by mouth  3. History of hypertension, not currently medicated - Monitor  4. H/o Diabetes - Accuchecks achs with RISS coverage - Check A1C  5. History of asthma O2 therapy and DuoNeb's as needed  Admission status: Inpatient IV Fluids: NS Diet/Nutrition: NPO Consults called: GI  DVT Px: Lovenox, SCDs and early ambulation. Code Status: Full Code  Disposition Plan: To home in 1-2 days   All the records are reviewed and case discussed with ED provider. Management plans discussed with the patient and/or family who express understanding and agree with plan of care.  Cameren Earnest D.O. on 08/12/2016 at 12:16 AM Between 7am to 6pm - Pager - [phone] After 6pm go to [email or web address] - [employer] [facility] Office [phone] CC: Primary care physician; No PCP Per Patient   08/12/2016, 12:16 AM

## 2016-08-12 NOTE — Progress Notes (Signed)
Pharmacy Antibiotic Note  Ander GasterJudy Larson is a 51 y.o. female admitted on 08/11/2016 with intra-abdominal infection.  Pharmacy has been consulted for ciprofloxacin dosing.  Plan: Ciprofloxacin 400 mg IV q 12 hours ordered  Height: 5\' 3"  (160 cm) Weight: 154 lb (69.9 kg) IBW/kg (Calculated) : 52.4  Temp (24hrs), Avg:98.4 F (36.9 C), Min:97.9 F (36.6 C), Max:98.9 F (37.2 C)   Recent Labs Lab 08/09/16 1706 08/11/16 1740  WBC 20.7* 23.9*  CREATININE 0.74 0.85    Estimated Creatinine Clearance: 72.6 mL/min (by C-G formula based on SCr of 0.85 mg/dL).    Allergies  Allergen Reactions  . Chantix [Varenicline]     Suicidal thoughts  . Morphine And Related Other (See Comments)    Reaction:  Redness of face     Antimicrobials this admission: ciprofloxacin metronidazole >> 2/20   >>   Dose adjustments this admission:   Microbiology results: 2/19 GI PCR: no panel pathogens detected    2/19 UA: (-)  Thank you for allowing pharmacy to be a part of this patient's care.  Allien Melberg S 08/12/2016 2:01 AM

## 2016-08-12 NOTE — Progress Notes (Signed)
Dr.Hugelmeyer, Hospitalist, paged [phone]) many times and office called [phone]), to clarify CBG orders and diet;Supervisor notified;  Dr. Tobi BastosPyreddy returned call; notified him of patient's claim that she "does not have diabetes, I've never been diagnosed and I don't take any medications for it".  Clarified that patient is to be NPO until seen in the am. New orders written. Windy Carinaurner,Oaklee Sunga K, RN 2:54 AM 08/12/2016

## 2016-08-13 ENCOUNTER — Inpatient Hospital Stay: Payer: Self-pay | Admitting: Anesthesiology

## 2016-08-13 ENCOUNTER — Encounter
Admission: EM | Disposition: A | Discharge status: Home / Self Care | Payer: Self-pay | Source: Home / Self Care | Attending: Internal Medicine

## 2016-08-13 DIAGNOSIS — K2990 Gastroduodenitis, unspecified, without bleeding: Secondary | ICD-10-CM

## 2016-08-13 DIAGNOSIS — R933 Abnormal findings on diagnostic imaging of other parts of digestive tract: Secondary | ICD-10-CM

## 2016-08-13 HISTORY — PX: ESOPHAGOGASTRODUODENOSCOPY (EGD) WITH PROPOFOL: SHX5813

## 2016-08-13 HISTORY — PX: COLONOSCOPY WITH PROPOFOL: SHX5780

## 2016-08-13 LAB — BASIC METABOLIC PANEL
ANION GAP: 7 (ref 5–15)
BUN: 7 mg/dL (ref 6–20)
CALCIUM: 8.6 mg/dL — AB (ref 8.9–10.3)
CO2: 24 mmol/L (ref 22–32)
Chloride: 108 mmol/L (ref 101–111)
Creatinine, Ser: 0.62 mg/dL (ref 0.44–1.00)
GFR calc Af Amer: >60 mL/min (ref >60)
GFR calc non Af Amer: >60 mL/min (ref >60)
Glucose, Bld: 88 mg/dL (ref 65–99)
POTASSIUM: 3.4 mmol/L — AB (ref 3.5–5.1)
SODIUM: 139 mmol/L (ref 135–145)

## 2016-08-13 LAB — CBC
HCT: 37.9 % (ref 35.0–47.0)
Hemoglobin: 13 g/dL (ref 12.0–16.0)
MCH: 29.3 pg (ref 26.0–34.0)
MCHC: 34.2 g/dL (ref 32.0–36.0)
MCV: 85.5 fL (ref 80.0–100.0)
PLATELETS: 404 10*3/uL (ref 150–440)
RBC: 4.44 MIL/uL (ref 3.80–5.20)
RDW: 14.1 % (ref 11.5–14.5)
WBC: 10.5 10*3/uL (ref 3.6–11.0)

## 2016-08-13 LAB — HEMOGLOBIN A1C
HEMOGLOBIN A1C: 5.4 % (ref 4.8–5.6)
Mean Plasma Glucose: 108

## 2016-08-13 LAB — HIV ANTIBODY (ROUTINE TESTING W REFLEX): HIV Screen 4th Generation wRfx: NONREACTIVE

## 2016-08-13 LAB — MAGNESIUM: MAGNESIUM: 1.9 mg/dL (ref 1.7–2.4)

## 2016-08-13 SURGERY — COLONOSCOPY WITH PROPOFOL
Anesthesia: General

## 2016-08-13 MED ORDER — GLYCOPYRROLATE 0.2 MG/ML IJ SOLN
INTRAMUSCULAR | Status: DC | PRN
  Administered 2016-08-13: 0.1 mg via INTRAVENOUS

## 2016-08-13 MED ORDER — PROPOFOL 500 MG/50ML IV EMUL
INTRAVENOUS | Status: AC
  Filled 2016-08-13: qty 50

## 2016-08-13 MED ORDER — ONDANSETRON HCL 4 MG/2ML IJ SOLN
4.0000 mg | Freq: Three times a day (TID) | INTRAMUSCULAR | Status: DC
  Administered 2016-08-13: 4 mg via INTRAVENOUS
  Filled 2016-08-13: qty 2

## 2016-08-13 MED ORDER — SODIUM CHLORIDE 0.9 % IV SOLN: INTRAVENOUS | Status: DC

## 2016-08-13 MED ORDER — SUCRALFATE 1 G PO TABS
1.0000 g | ORAL_TABLET | Freq: Three times a day (TID) | ORAL | Status: DC
  Administered 2016-08-13 – 2016-08-14 (×3): 1 g via ORAL
  Filled 2016-08-13 (×7): qty 1

## 2016-08-13 MED ORDER — FENTANYL CITRATE (PF) 100 MCG/2ML IJ SOLN
INTRAMUSCULAR | Status: DC | PRN
  Administered 2016-08-13: 50 ug via INTRAVENOUS

## 2016-08-13 MED ORDER — ONDANSETRON HCL 4 MG PO TABS
4.0000 mg | ORAL_TABLET | Freq: Three times a day (TID) | ORAL | Status: DC
  Administered 2016-08-13 – 2016-08-14 (×3): 4 mg via ORAL
  Filled 2016-08-13 (×3): qty 1

## 2016-08-13 MED ORDER — SODIUM CHLORIDE FLUSH 0.9 % IV SOLN
INTRAVENOUS | Status: AC
  Filled 2016-08-13: qty 10

## 2016-08-13 MED ORDER — FENTANYL CITRATE (PF) 100 MCG/2ML IJ SOLN
INTRAMUSCULAR | Status: AC
  Filled 2016-08-13: qty 2

## 2016-08-13 MED ORDER — ENSURE ENLIVE PO LIQD
237.0000 mL | Freq: Two times a day (BID) | ORAL | Status: DC
  Administered 2016-08-13 – 2016-08-14 (×2): 237 mL via ORAL

## 2016-08-13 MED ORDER — EPHEDRINE SULFATE 50 MG/ML IJ SOLN
INTRAMUSCULAR | Status: AC
  Filled 2016-08-13: qty 1

## 2016-08-13 MED ORDER — PROPOFOL 500 MG/50ML IV EMUL
INTRAVENOUS | Status: DC | PRN
  Administered 2016-08-13: 120 ug/kg/min via INTRAVENOUS

## 2016-08-13 MED ORDER — MIDAZOLAM HCL 2 MG/2ML IJ SOLN
INTRAMUSCULAR | Status: AC
  Filled 2016-08-13: qty 2

## 2016-08-13 MED ORDER — EPHEDRINE SULFATE 50 MG/ML IJ SOLN
INTRAMUSCULAR | Status: DC | PRN
  Administered 2016-08-13: 10 mg via INTRAVENOUS

## 2016-08-13 MED ORDER — LIDOCAINE HCL (PF) 2 % IJ SOLN
INTRAMUSCULAR | Status: AC
  Filled 2016-08-13: qty 2

## 2016-08-13 MED ORDER — LIDOCAINE HCL (CARDIAC) 20 MG/ML IV SOLN
INTRAVENOUS | Status: DC | PRN
  Administered 2016-08-13: 30 mg via INTRAVENOUS

## 2016-08-13 MED ORDER — GLYCOPYRROLATE 0.2 MG/ML IJ SOLN
INTRAMUSCULAR | Status: AC
  Filled 2016-08-13: qty 1

## 2016-08-13 MED ORDER — MIDAZOLAM HCL 2 MG/2ML IJ SOLN
INTRAMUSCULAR | Status: DC | PRN
  Administered 2016-08-13: 1 mg via INTRAVENOUS

## 2016-08-13 MED ORDER — LIDOCAINE HCL (CARDIAC) 20 MG/ML IV SOLN: INTRAVENOUS | Status: DC | PRN

## 2016-08-13 NOTE — Progress Notes (Signed)
EGD:  Gastritis and duodenitis- bx taken    Colonoscopy   Poor prep   Grossly appeared normal including Terminal ileum. Biopsies taken    Plan   1. Advance diet  2. Carafate TID 3. PPI 4. OP GI follow up  5. Stool H pylori antigen  6. No NSAID's 7. Imodium PRN for diarrhea with high fiber diet - poor prep dispite golytely ?? Constipation with overflow diarrhea.   I will sign off.  Please call me if any further GI concerns or questions.  We would like to thank you for the opportunity to participate in the care of Ander GasterJudy Sancho.   Dr Wyline MoodKiran Elis Sauber  Gastroenterology/Hepatology Pager: [phone]

## 2016-08-13 NOTE — Transfer of Care (Signed)
Immediate Anesthesia Transfer of Care Note  Patient: Lynn Larson  Procedure(s) Performed: Procedure(s): COLONOSCOPY WITH PROPOFOL ([name]) ESOPHAGOGASTRODUODENOSCOPY (EGD) WITH PROPOFOL ([name])  Patient Location: PACU  Anesthesia Type:General  Level of Consciousness: awake and sedated  Airway & Oxygen Therapy: Patient Spontanous Breathing and Patient connected to nasal cannula oxygen  Post-op Assessment: Report given to RN and Post -op Vital signs reviewed and stable  Post vital signs: Reviewed and stable  Last Vitals:  Vitals:   08/13/16 0849 08/13/16 0938  BP: (!) 149/76 130/87  Pulse: 64 67  Resp: 12 18  Temp: 36.8 C     Last Pain:  Vitals:   08/13/16 0938  TempSrc: Tympanic  PainSc:       Patients Stated Pain Goal: 0 (08/12/16 2010)  Complications: No apparent anesthesia complications

## 2016-08-13 NOTE — H&P (Signed)
Wyline MoodKiran Jaivyn Gulla MD [address] [place], [place] [address] Phone: [phone] Fax : [phone]  Primary Care Physician:  No PCP Per Patient Primary Gastroenterologist:  Dr. Wyline MoodKiran Kathalina Ostermann   Pre-Procedure History & Physical: HPI:  Lynn Larson is a 51 y.o. female is here for an endoscopy and colonoscopy.   Past Medical History:  Diagnosis Date  . Asthma   . Gastric ulcer   . Hypertension     Past Surgical History:  Procedure Laterality Date  . CESAREAN SECTION    . CHOLECYSTECTOMY    . ESOPHAGOGASTRODUODENOSCOPY (EGD) WITH PROPOFOL [name] 08/15/2015   Procedure: ESOPHAGOGASTRODUODENOSCOPY (EGD) WITH PROPOFOL;  Surgeon: Christena DeemMartin U Skulskie, MD;  [facility];  Service: Endoscopy;  Laterality: [name];    Prior to Admission medications   Medication Sig Start Date End Date Taking? Authorizing Provider  acetaminophen (TYLENOL) 325 MG tablet Take 2 tablets (650 mg total) by mouth every 6 (six) hours as needed for mild pain (or Fever >/= 101). 07/24/16  Yes Ramonita LabAruna Gouru, MD  alum & mag hydroxide-simeth (MAALOX/MYLANTA) 200-200-20 MG/5ML suspension Take 15-30 mLs by mouth 2 (two) times daily. 2 tablespoons in the morning and 1 tablespoon in the evening.   Yes Historical Provider, MD  loperamide (IMODIUM A-D) 2 MG tablet Take 1 tablet (2 mg total) by mouth 4 (four) times daily as needed for diarrhea or loose stools. 08/02/16  Yes Houston SirenVivek J Sainani, MD  metoCLOPramide (REGLAN) 5 MG tablet Take 1 tablet (5 mg total) by mouth 3 (three) times daily before meals. 01/20/16  Yes Enid Baasadhika Kalisetti, MD  ondansetron (ZOFRAN) 4 MG tablet Take 1 tablet (4 mg total) by mouth every 8 (eight) hours as needed for nausea or vomiting. 07/24/16 07/24/17 Yes Ramonita LabAruna Gouru, MD  pantoprazole (PROTONIX) 40 MG tablet Take 1 tablet (40 mg total) by mouth 2 (two) times daily. 01/20/16  Yes Enid Baasadhika Kalisetti, MD  sucralfate (CARAFATE) 1 g tablet Take 1 tablet (1 g total) by mouth 4 (four) times daily -  with meals and at  bedtime. 07/24/16  Yes Ramonita LabAruna Gouru, MD  prochlorperazine (COMPAZINE) 10 MG tablet Take 1 tablet (10 mg total) by mouth every 8 (eight) hours as needed (headache). Patient not taking: Reported on 08/11/2016 08/09/16   Phineas SemenGraydon Goodman, MD  promethazine (PHENERGAN) 25 MG suppository Place 1 suppository (25 mg total) rectally every 6 (six) hours as needed for nausea. Patient not taking: Reported on 08/11/2016 08/09/16 08/09/17  Phineas SemenGraydon Goodman, MD    Allergies as of 08/11/2016 - Review Complete 08/11/2016  Allergen Reaction Noted  . Chantix [varenicline]  08/14/2015  . Morphine and related Other (See Comments) 02/04/2015    Family History  Problem Relation Age of Onset  . Breast cancer Maternal Grandmother   . Ovarian cancer Maternal Grandmother     Social History   Social History  . Marital status: Single    Spouse name: [name]  . Number of children: [name]  . Years of education: [name]   Occupational History  . Not on file.   Social History Main Topics  . Smoking status: Former Smoker    Packs/day: 0.50    Years: 30.00    Types: Cigarettes  . Smokeless tobacco: Never Used  . Alcohol use No  . Drug use: Yes    Types: Marijuana     Comment: Ocassionally.  last dsoe a week ago  . Sexual activity: Not on file   Other Topics Concern  . Not on file   Social History  Narrative   Lives with husband at home.   Independent at baseline    Review of Systems: See HPI, otherwise negative ROS  Physical Exam: BP 130/87   Pulse 67   Temp 98.2 F (36.8 C) (Oral)   Resp 18   Ht 5\' 3"  (1.6 m)   Wt 154 lb (69.9 kg)   SpO2 99%   BMI 27.28 kg/m  General:   Alert,  pleasant and cooperative in NAD Head:  Normocephalic and atraumatic. Neck:  Supple; no masses or thyromegaly. Lungs:  Clear throughout to auscultation.    Heart:  Regular rate and rhythm. Abdomen:  Soft, nontender and nondistended. Normal bowel sounds, without guarding, and without rebound.   Neurologic:  Alert and  oriented x4;   grossly normal neurologically.  Impression/Plan: Lynn Larson is here for an endoscopy and colonoscopy to be performed for abnormal CT scan   Risks, benefits, limitations, and alternatives regarding  endoscopy and colonoscopy have been reviewed with the patient.  Questions have been answered.  All parties agreeable.   Wyline Mood, MD  08/13/2016, 11:08 AM

## 2016-08-13 NOTE — Anesthesia Preprocedure Evaluation (Addendum)
Anesthesia Evaluation  Patient identified by MRN, date of birth, ID band Patient awake    Reviewed: Allergy & Precautions, NPO status , Patient's Chart, lab work & pertinent test results, reviewed documented beta blocker date and time   History of Anesthesia Complications Negative for: history of anesthetic complications  Airway Mallampati: II  TM Distance: >3 FB     Dental  (+) Chipped   Pulmonary asthma , Recent URI , Current Smoker,           Cardiovascular hypertension, Pt. on medications      Neuro/Psych negative neurological ROS  negative psych ROS   GI/Hepatic Neg liver ROS, PUD, GERD  ,Positive for norovirus   Endo/Other  negative endocrine ROS  Renal/GU negative Renal ROS  negative genitourinary   Musculoskeletal   Abdominal   Peds  Hematology negative hematology ROS (+)   Anesthesia Other Findings Past Medical History: No date: Asthma No date: Gastric ulcer No date: Hypertension   Reproductive/Obstetrics                            Anesthesia Physical  Anesthesia Plan  ASA: II  Anesthesia Plan: General   Post-op Pain Management:    Induction: Intravenous  Airway Management Planned: Nasal Cannula  Additional Equipment:   Intra-op Plan:   Post-operative Plan:   Informed Consent: I have reviewed the patients History and Physical, chart, labs and discussed the procedure including the risks, benefits and alternatives for the proposed anesthesia with the patient or authorized representative who has indicated his/her understanding and acceptance.     Plan Discussed with: CRNA  Anesthesia Plan Comments:         Anesthesia Quick Evaluation

## 2016-08-13 NOTE — Progress Notes (Signed)
Dr Renae GlossWieting gave order to change zofran to tid and at bedtime

## 2016-08-13 NOTE — Progress Notes (Signed)
Initial Nutrition Assessment  DOCUMENTATION CODES:   Not applicable  INTERVENTION:  1. Ensure Enlive po BID, each supplement provides 350 kcal and 20 grams of protein  NUTRITION DIAGNOSIS:   Inadequate oral intake related to poor appetite, nausea, vomiting as evidenced by per patient/family report.  GOAL:   Patient will meet greater than or equal to 90% of their needs  MONITOR:   Supplement acceptance, I & O's, Labs, Weight trends, PO intake  REASON FOR ASSESSMENT:   Malnutrition Screening Tool    ASSESSMENT:   Lynn Larson is a 51 y.o. female with a known history of asthma, diabetes, hypertension, multiple recent emergency department visits and hospitalizations for similar symptoms including norovirus  was in a usual state of health until early January 2018 when she developed abdominal pain, cramping  Patient with multiple admissions this month. Continues with nausea/vomiting/abd pain. Per chart, exhibits a 15#/9% wt loss over 7 months. States she was unable to consume anything x3 days PTA. Upper GI & Colonoscopy done today. Found to have esophagitis, gastritis, diverticulosis. Had solid food tray at bedside, was about to eat - [place] and mashed potatoes Nutrition-Focused physical exam completed. Findings are no fat depletion, no muscle depletion, and no edema.  Admits to hiatal hernia but no other issues chewing/swallowing Labs and medications reviewed: NS w/ KCL @ 6275mL/hr   Diet Order:  Diet regular Room service appropriate? Yes; Fluid consistency: Thin  Skin:  Reviewed, no issues  Last BM:  08/13/2016  Height:   Ht Readings from Last 1 Encounters:  08/13/16 5\' 3"  (1.6 m)    Weight:   Wt Readings from Last 1 Encounters:  08/13/16 154 lb (69.9 kg)    Ideal Body Weight:  52.3 kg  BMI:  Body mass index is 27.28 kg/m.  Estimated Nutritional Needs:   Kcal:  [id number] gm  Protein:  69-83 gm  Fluid:  >/= 1.5L  EDUCATION NEEDS:   No education needs  identified at this time  Dionne AnoWilliam M. Geneen Dieter, MS, RD LDN Inpatient Clinical Dietitian Pager [phone]

## 2016-08-13 NOTE — Progress Notes (Signed)
Patient going for endoscopy via bed

## 2016-08-13 NOTE — Op Note (Signed)
[facility] Gastroenterology Patient Name: Lynn GasterJudy Larson Procedure Date: 08/13/2016 11:49 AM MRN: [id number] Account #: [id number] Date of Birth: 20-Jun-1966 Admit Type: Inpatient Age: [age] [facility] Gender: Female Note Status: Finalized Procedure:            Upper GI endoscopy Indications:          Abnormal CT of the GI tract Providers:            Wyline MoodKiran Miamarie Moll MD, MD Medicines:            Monitored Anesthesia Care Complications:        No immediate complications. Procedure:            Pre-Anesthesia Assessment:                       - Prior to the procedure, a History and Physical was                        performed, and patient medications, allergies and                        sensitivities were reviewed. The patient's tolerance of                        previous anesthesia was reviewed.                       - The risks and benefits of the procedure and the                        sedation options and risks were discussed with the                        patient. All questions were answered and informed                        consent was obtained.                       - The risks and benefits of the procedure and the                        sedation options and risks were discussed with the                        patient. All questions were answered and informed                        consent was obtained.                       - ASA Grade Assessment: III - A patient with severe                        systemic disease.                       After obtaining informed consent, the endoscope was                        passed under direct vision. Throughout the procedure,  the patient's blood pressure, pulse, and oxygen                        saturations were monitored continuously. The Endoscope                        was introduced through the mouth, and advanced to the                        third part of duodenum. The upper GI  endoscopy was                        accomplished with ease. The patient tolerated the                        procedure well. Findings:      The esophagus was normal.      Localized moderate inflammation characterized by congestion (edema),       erosions, erythema and linear erosions was found in the gastric antrum.       Biopsies were taken with a cold forceps for histology.      Patchy mild inflammation characterized by congestion (edema) and       erythema was found in the entire duodenum. Biopsies were taken with a       cold forceps for histology.      A 3 cm hiatal hernia was present. Impression:           - Normal esophagus.                       - Gastritis. Biopsied.                       - Duodenitis. Biopsied.                       - 3 cm hiatal hernia. Recommendation:       - Return patient to hospital ward for ongoing care.                       - Advance diet as tolerated today.                       - Use sucralfate tablets 1 gram PO QID for 4 weeks.                       - Use Prilosec (omeprazole) 40 mg PO daily for 8 weeks.                       - Perform an H. pylori stool antigen (HpSA) test today.                       - Perform a colonoscopy today. Procedure Code(s):    --- Professional ---                       [phone], Esophagogastroduodenoscopy, flexible, transoral;                        with biopsy, single or multiple Diagnosis Code(s):    --- Professional ---  K29.70, Gastritis, unspecified, without bleeding                       K29.80, Duodenitis without bleeding                       K44.9, Diaphragmatic hernia without obstruction or                        gangrene                       R93.3, Abnormal findings on diagnostic imaging of other                        parts of digestive tract CPT copyright 2016 American Medical Association. All rights reserved. The codes documented in this report are preliminary and upon coder review may   be revised to meet current compliance requirements. Wyline Mood, MD Wyline Mood MD, MD 08/13/2016 12:05:48 PM This report has been signed electronically. Number of Addenda: 0 Note Initiated On: 08/13/2016 11:49 AM      [facility]

## 2016-08-13 NOTE — Anesthesia Post-op Follow-up Note (Cosign Needed)
Anesthesia QCDR form completed.        

## 2016-08-13 NOTE — Op Note (Signed)
[facility] Gastroenterology Patient Name: Lynn GasterJudy Nussbaumer Procedure Date: 08/13/2016 11:48 AM MRN: [id number] Account #: [id number] Date of Birth: Dec 09, 1965 Admit Type: Inpatient Age: [age] [facility] Gender: Female Note Status: Finalized Procedure:            Colonoscopy Indications:          Abnormal CT of the GI tract Providers:            Wyline MoodKiran Camelle Henkels MD, MD Medicines:            Monitored Anesthesia Care Complications:        No immediate complications. Procedure:            Pre-Anesthesia Assessment:                       - Prior to the procedure, a History and Physical was                        performed, and patient medications, allergies and                        sensitivities were reviewed. The patient's tolerance of                        previous anesthesia was reviewed.                       - The risks and benefits of the procedure and the                        sedation options and risks were discussed with the                        patient. All questions were answered and informed                        consent was obtained.                       - The risks and benefits of the procedure and the                        sedation options and risks were discussed with the                        patient. All questions were answered and informed                        consent was obtained.                       - ASA Grade Assessment: III - A patient with severe                        systemic disease.                       After obtaining informed consent, the colonoscope was                        passed under direct vision. Throughout the procedure,  the patient's blood pressure, pulse, and oxygen                        saturations were monitored continuously. The                        Colonoscope was introduced through the anus and                        advanced to the the terminal ileum. The colonoscopy was                  performed with ease. The patient tolerated the                        procedure well. The quality of the bowel preparation                        was poor. Findings:      The perianal and digital rectal examinations were normal.      A few small-mouthed diverticula were found in the sigmoid colon.      The colon (entire examined portion) appeared normal. Biopsies were taken       with a cold forceps for histology.      Non-bleeding internal hemorrhoids were found during retroflexion. The       hemorrhoids were medium-sized and Grade I (internal hemorrhoids that do       not prolapse).      The terminal ileum appeared normal. Biopsies were taken with a cold       forceps for histology. Impression:           - Preparation of the colon was poor.                       - Diverticulosis in the sigmoid colon.                       - The entire examined colon is normal. Biopsied.                       - Non-bleeding internal hemorrhoids.                       - The examined portion of the ileum was normal.                        Biopsied. Recommendation:       - Return patient to hospital ward for ongoing care.                       - Advance diet as tolerated today.                       - Continue present medications.                       - Imodium 1 tablet PO BID for 2 weeks.                       - Await pathology results.                       -  Return to my office in 2 weeks.                       - No NSAID's                       - Prep of the colon was insufficient for colorectal                        cancer screening and screening for polyps. Procedure Code(s):    --- Professional ---                       [phone], Colonoscopy, flexible; with biopsy, single or                        multiple CPT copyright 2016 American Medical Association. All rights reserved. The codes documented in this report are preliminary and upon coder review may  be revised to meet current  compliance requirements. Wyline Mood, MD Wyline Mood MD, MD 08/13/2016 12:22:38 PM This report has been signed electronically. Number of Addenda: 0 Note Initiated On: 08/13/2016 11:48 AM Scope Withdrawal Time: 0 hours 9 minutes 58 seconds  Total Procedure Duration: 0 hours 10 minutes 0 seconds       [facility]

## 2016-08-13 NOTE — Progress Notes (Signed)
Patient ID: Lynn Larson, female   DOB: 1965-12-06, 51 y.o.   MRN: [id number]  [employer] PROGRESS NOTE  Lynn Larson WUJ:[id number] DOB: 1965-12-02 DOA: 08/11/2016 PCP: No PCP Per Patient  HPI/Subjective: Patient seen after endoscopy and colonoscopy. Having abdominal pain. Gastroenterology advance diet to solid food but she is hesitant on eating.  Objective: Vitals:   08/13/16 1253 08/13/16 1321  BP: 126/84 133/85  Pulse: 69 74  Resp: 14 18  Temp:  97.9 F (36.6 C)    Filed Weights   08/11/16 1733 08/12/16 0155 08/13/16 0938  Weight: 72.6 kg (160 lb) 69.9 kg (154 lb) 69.9 kg (154 lb)    ROS: Review of Systems  Constitutional: Negative for chills and fever.  Eyes: Negative for blurred vision.  Respiratory: Negative for cough and shortness of breath.   Cardiovascular: Negative for chest pain.  Gastrointestinal: Positive for abdominal pain, diarrhea and nausea. Negative for constipation and vomiting.  Genitourinary: Negative for dysuria.  Musculoskeletal: Negative for joint pain.  Neurological: Negative for dizziness and headaches.   Exam: Physical Exam  Constitutional: She is oriented to person, place, and time.  HENT:  Nose: No mucosal edema.  Mouth/Throat: No oropharyngeal exudate or posterior oropharyngeal edema.  Eyes: Conjunctivae, EOM and lids are normal. Pupils are equal, round, and reactive to light.  Neck: No JVD present. Carotid bruit is not present. No edema present. No thyroid mass and no thyromegaly present.  Cardiovascular: S1 normal and S2 normal.  Exam reveals no gallop.   No murmur heard. Pulses:      Dorsalis pedis pulses are 2+ on the right side, and 2+ on the left side.  Respiratory: No respiratory distress. She has no wheezes. She has no rhonchi. She has no rales.  GI: Soft. Bowel sounds are normal. There is tenderness.  Musculoskeletal:       Right shoulder: She exhibits no swelling.  Lymphadenopathy:    She has no cervical adenopathy.   Neurological: She is alert and oriented to person, place, and time. No cranial nerve deficit.  Skin: Skin is warm. No rash noted. Nails show no clubbing.  Psychiatric: She has a normal mood and affect.      Data Reviewed: Basic Metabolic Panel:  Recent Labs Lab 08/09/16 1706 08/11/16 1740 08/12/16 0441 08/13/16 0424  NA 141 134* 140 139  K 3.9 3.3* 3.1* 3.4*  CL 104 99* 108 108  CO2 26 23 26 24   GLUCOSE 129* 116* 94 88  BUN 15 13 10 7   CREATININE 0.74 0.85 0.65 0.62  CALCIUM 9.8 10.7* 8.5* 8.6*  MG  --   --  1.9 1.9  PHOS  --   --  3.5  --    Liver Function Tests:  Recent Labs Lab 08/09/16 1706 08/11/16 1740 08/12/16 0441  AST 28 32 21  ALT 30 28 21   ALKPHOS 99 100 72  BILITOT 0.6 1.5* 1.1  PROT 8.8* 9.0* 6.4*  ALBUMIN 4.8 5.2* 3.7    Recent Labs Lab 08/09/16 1706 08/11/16 1740  LIPASE 26 48   CBC:  Recent Labs Lab 08/09/16 1706 08/11/16 1740 08/12/16 0441 08/13/16 0424  WBC 20.7* 23.9* 12.8* 10.5  NEUTROABS 17.0* 15.6*  --   --   HGB 15.3 15.9 13.2 13.0  HCT 43.9 46.3 39.1 37.9  MCV 85.5 84.0 85.4 85.5  PLT 527* 567* 416 404     Recent Results (from the past 240 hour(s))  Gastrointestinal Panel by PCR , Stool  Status: None   Collection Time: 08/11/16 11:38 PM  Result Value Ref Range Status   Campylobacter species NOT DETECTED NOT DETECTED Final   Plesimonas shigelloides NOT DETECTED NOT DETECTED Final   Salmonella species NOT DETECTED NOT DETECTED Final   Yersinia enterocolitica NOT DETECTED NOT DETECTED Final   Vibrio species NOT DETECTED NOT DETECTED Final   Vibrio cholerae NOT DETECTED NOT DETECTED Final   Enteroaggregative E coli (EAEC) NOT DETECTED NOT DETECTED Final   Enteropathogenic E coli (EPEC) NOT DETECTED NOT DETECTED Final   Enterotoxigenic E coli (ETEC) NOT DETECTED NOT DETECTED Final   Shiga like toxin producing E coli (STEC) NOT DETECTED NOT DETECTED Final   Shigella/Enteroinvasive E coli (EIEC) NOT DETECTED NOT  DETECTED Final   Cryptosporidium NOT DETECTED NOT DETECTED Final   Cyclospora cayetanensis NOT DETECTED NOT DETECTED Final   Entamoeba histolytica NOT DETECTED NOT DETECTED Final   Giardia lamblia NOT DETECTED NOT DETECTED Final   Adenovirus F40/41 NOT DETECTED NOT DETECTED Final   Astrovirus NOT DETECTED NOT DETECTED Final   Norovirus GI/GII NOT DETECTED NOT DETECTED Final   Rotavirus A NOT DETECTED NOT DETECTED Final   Sapovirus (I, II, IV, and V) NOT DETECTED NOT DETECTED Final  C difficile quick scan w PCR reflex     Status: None   Collection Time: 08/11/16 11:38 PM  Result Value Ref Range Status   C Diff antigen NEGATIVE NEGATIVE Final   C Diff toxin NEGATIVE NEGATIVE Final   C Diff interpretation No C. difficile detected.  Final     Studies: Ct Abdomen Pelvis W Contrast  Result Date: 08/11/2016 CLINICAL DATA:  51 year old female with upper abdominal pain, nausea and vomiting. EXAM: CT ABDOMEN AND PELVIS WITH CONTRAST TECHNIQUE: Multidetector CT imaging of the abdomen and pelvis was performed using the standard protocol following bolus administration of intravenous contrast. CONTRAST:  ISOVUE-300 IOPAMIDOL (ISOVUE-300) INJECTION 61% COMPARISON:  Abdominal CT dated 07/27/2016 and 01/16/2016 FINDINGS: Lower chest: The visualized lung bases are clear. No intra-abdominal free air or free fluid. Hepatobiliary: There is minimal irregularity of the hepatic contour as seen on the prior CTs. The liver is otherwise unremarkable. No intrahepatic biliary ductal dilatation. Cholecystectomy. Pancreas: Unremarkable. No pancreatic ductal dilatation or surrounding inflammatory changes. Spleen: Normal in size without focal abnormality. Adrenals/Urinary Tract: The adrenal glands appear unremarkable. Subcentimeter right renal hypodense lesions are too small to characterize but appear similar to prior CT and most likely represent cysts. There is no hydronephrosis on either side. The visualized ureters and  urinary bladder appear unremarkable. Stomach/Bowel: There is persistent diffuse thickened appearance of the colon concerning for colitis. Findings may be infectious in etiology or related to underlying inflammatory bowel disease. Correlation with clinical exam and stool cultures recommended. There is loose stool within the colon compatible with diarrheal state. There is segmental thickened appearance of the gastric antrum which although may be related to underdistention, gastritis is not excluded. Correlation with clinical exam recommended. There is slight thickened appearance of this portion of the stomach on multiple prior CTs. Further evaluation with endoscopy is recommended to exclude an infiltrative process. There is no evidence of bowel obstruction. An appendicolith is noted within the appendix. The appendix is otherwise unremarkable. Vascular/Lymphatic: There is mild moderate aortoiliac atherosclerotic disease predominantly involving distal aorta and common iliac arteries. The origins of the celiac axis, SMA, IMA as well as the origins of the renal arteries are patent. The SMV, splenic vein, and main portal vein are patent. No portal  venous gas identified. There is no adenopathy. Reproductive: The uterus is anteverted and grossly unremarkable. The ovaries appear unremarkable as well. Other: None Musculoskeletal: Mild degenerative changes of the spine. No acute fracture. IMPRESSION: 1. Findings concerning for colitis with diarrheal state. Correlation with clinical exam and stool cultures recommended. No bowel obstruction. Normal appendix. 2. Segmental thickening of the gastric antrum which may be related to underdistention. Gastritis or underlying infiltrative process is less likely but not excluded. Correlation with clinical exam is recommended. Endoscopy may provide better evaluation of the stomach. 3. Minimal irregular appearing of the hepatic contour as seen on the prior CTs. Correlation with clinical exam  and LFTs recommended. 4. Aortoiliac atherosclerotic disease. Electronically Signed   By: Elgie CollardArash  Radparvar M.D.   On: 08/11/2016 21:26    Scheduled Meds: . acidophilus  2 capsule Oral TID  . enoxaparin (LOVENOX) injection  40 mg Subcutaneous Q24H  . feeding supplement (ENSURE ENLIVE)  237 mL Oral BID BM  . ondansetron  4 mg Oral TID AC & HS   Or  . ondansetron (ZOFRAN) IV  4 mg Intravenous TID AC & HS  . pantoprazole  40 mg Oral BID  . sodium chloride flush      . sucralfate  1 g Oral TID WC & HS   Continuous Infusions: . sodium chloride    . 0.9% NaCl with KCL 20 mEq/L infusion 75 mL/hr at 08/12/16 2150    Assessment/Plan:  1. Abdominal pain. Acute colitis Seen on CT scan only. Recent infection with norovirus. Antibiotics stopped. Stool studies negative. Could be cannabis hyperemesis syndrome.  Colonoscopy negative. Erythema in the stomach and duodenum. Patient on Protonix. Gastroenterology advance diet. Patient with numerous hospitalizations in the past for abdominal pain 2. Hypokalemia replace potassium in IV fluids 3. Leukocytosis secondary to nausea vomiting this has improved with IV fluid hydration 4. Thrombocytosis improved with IV fluid hydration likely reactive from nausea vomiting. 5. Hypercalcemia on presentation secondary to dehydration.  Code Status:     Code Status Orders        Start     Ordered   08/12/16 0145  Full code  Continuous     08/12/16 0144    Code Status History    Date Active Date Inactive Code Status Order ID Comments User Context   08/01/2016 12:39 PM 08/02/2016  9:00 PM Full Code [id number]  Enid Baasadhika Kalisetti, MD Inpatient   07/28/2016  2:28 PM 07/30/2016  8:24 PM Full Code [id number]  Alford Highlandichard Iven Earnhart, MD ED   07/22/2016 11:48 PM 07/24/2016  5:59 PM Full Code [id number]  Oralia Manisavid Willis, MD Inpatient   01/16/2016  9:33 PM 01/20/2016  3:07 PM Full Code [id number]  Houston SirenVivek J Sainani, MD ED   08/14/2015  5:33 PM 08/17/2015  2:44 PM Full Code [id number]  Adrian SaranSital Mody, MD  Inpatient   02/15/2015  8:31 PM 02/18/2015 10:31 PM Full Code [id number]  Katha HammingSnehalatha Konidena, MD ED     Disposition Plan: If able to tolerate a few solid meals, may be able to go home tomorrow  Consultants:  Gastroneurology  Time spent: 25 minutes  Alford HighlandWIETING, Dickie Cloe  [employer]

## 2016-08-13 NOTE — Anesthesia Procedure Notes (Signed)
Performed by: COOK-MARTIN, Kora Groom Pre-anesthesia Checklist: Patient identified, Emergency Drugs available, Suction available, Patient being monitored and Timeout performed Patient Re-evaluated:Patient Re-evaluated prior to inductionOxygen Delivery Method: Nasal cannula Preoxygenation: Pre-oxygenation with 100% oxygen Intubation Type: IV induction Airway Equipment and Method: Bite block Placement Confirmation: CO2 detector and positive ETCO2     

## 2016-08-14 ENCOUNTER — Encounter: Payer: Self-pay | Admitting: Gastroenterology

## 2016-08-14 LAB — SURGICAL PATHOLOGY

## 2016-08-14 LAB — H. PYLORI ANTIGEN, STOOL: H. Pylori Stool Ag, Eia: NEGATIVE

## 2016-08-14 MED ORDER — ONDANSETRON HCL 4 MG PO TABS: 4.0000 mg | ORAL_TABLET | Freq: Three times a day (TID) | ORAL | 0 refills | Status: DC | PRN

## 2016-08-14 MED ORDER — RISAQUAD PO CAPS: 2.0000 | ORAL_CAPSULE | Freq: Three times a day (TID) | ORAL | 0 refills | Status: AC

## 2016-08-14 MED ORDER — OXYCODONE HCL 5 MG PO TABS: 5.0000 mg | ORAL_TABLET | Freq: Four times a day (QID) | ORAL | 0 refills | Status: DC | PRN

## 2016-08-14 MED ORDER — DICYCLOMINE HCL 20 MG PO TABS: 20.0000 mg | ORAL_TABLET | Freq: Three times a day (TID) | ORAL | 0 refills | Status: AC

## 2016-08-14 MED ORDER — ENSURE ENLIVE PO LIQD: 237.0000 mL | Freq: Two times a day (BID) | ORAL | 0 refills | Status: DC

## 2016-08-14 NOTE — Progress Notes (Signed)
Patient ID: Lynn GasterJudy Trigg, female   DOB: 01/11/1966, 51 y.o.   MRN: [id number] [employer] [facility] [place] [facility]        Lynn GasterJudy Ellen was admitted to the Hospital on 08/11/2016 and Discharged  08/14/2016 and should be excused from work/school   for 4  days starting 08/11/2016 , may return to work/school without any restrictions.  Alford HighlandWIETING, Zayin Valadez M.D on 08/14/2016,at 10:06 AM  [employer] - [place] [facility]    Office  [phone]

## 2016-08-14 NOTE — Discharge Summary (Signed)
[facility] -  at [facility]   PATIENT NAME: Lynn Larson    MR#:  [id number]  DATE OF BIRTH:  1966-04-13  DATE OF ADMISSION:  08/11/2016 ADMITTING PHYSICIAN: Tonye RoyaltyAlexis Hugelmeyer, DO  DATE OF DISCHARGE: 08/14/2016 11:20 AM  PRIMARY CARE PHYSICIAN: [facility]    ADMISSION DIAGNOSIS:  Colitis [K52.9] Abdominal pain, unspecified abdominal location [R10.9] Non-intractable vomiting with nausea, unspecified vomiting type [R11.2]  DISCHARGE DIAGNOSIS:  Abdominal pain Nausea vomiting Gastritis  SECONDARY DIAGNOSIS:   Past Medical History:  Diagnosis Date  . Asthma   . Gastric ulcer   . Hypertension     HOSPITAL COURSE:   1.  Abdominal pain with nausea vomiting. Patient had gastritis and duodenitis seen on endoscopy. Patient feeling better at the time of discharge. Patient is on Protonix and Carafate as outpatient. Patient has had numerous hospitalizations in the past for abdominal pain. This could be cannabis hyperemesis syndrome. She was advised to stop smoking marijuana. The patient also had a recent infection with norovirus. Stool studies this time were negative. The acute colitis seen on CT scan only was not confirmed on colonoscopy. Antibiotics were discontinued. Trial of Bentyl given. 2. Hypokalemia replaced during the hospital course 3. Leukocytosis secondary to nausea and vomiting. This has improved. 4. Thrombocytosis improved with IV fluid hydration. This is reactive secondary to nausea vomiting 5. Hypercalcemia on presentation secondary to dehydration.  DISCHARGE CONDITIONS:   Satisfactory  CONSULTS OBTAINED:   gastroenterology  DRUG ALLERGIES:   Allergies  Allergen Reactions  . Chantix [Varenicline]     Suicidal thoughts  . Morphine And Related Other (See Comments)    Reaction:  Redness of face     DISCHARGE MEDICATIONS:   Discharge Medication List as of 08/14/2016 10:53 AM    START taking these medications   Details   acidophilus (RISAQUAD) CAPS capsule Take 2 capsules by mouth 3 (three) times daily., Starting Thu 08/14/2016, Print    dicyclomine (BENTYL) 20 MG tablet Take 1 tablet (20 mg total) by mouth 4 (four) times daily -  before meals and at bedtime., Starting Thu 08/14/2016, Print    feeding supplement, ENSURE ENLIVE, (ENSURE ENLIVE) LIQD Take 237 mLs by mouth 2 (two) times daily between meals., Starting Thu 08/14/2016, Print    oxyCODONE (OXY IR/ROXICODONE) 5 MG immediate release tablet Take 1 tablet (5 mg total) by mouth every 6 (six) hours as needed for moderate pain., Starting Thu 08/14/2016, Print      CONTINUE these medications which have CHANGED   Details  ondansetron (ZOFRAN) 4 MG tablet Take 1 tablet (4 mg total) by mouth every 8 (eight) hours as needed for nausea or vomiting., Starting Thu 08/14/2016, Until Fri 08/14/2017, Print      CONTINUE these medications which have NOT CHANGED   Details  acetaminophen (TYLENOL) 325 MG tablet Take 2 tablets (650 mg total) by mouth every 6 (six) hours as needed for mild pain (or Fever >/= 101)., Starting Thu 07/24/2016, OTC    alum & mag hydroxide-simeth (MAALOX/MYLANTA) 200-200-20 MG/5ML suspension Take 15-30 mLs by mouth 2 (two) times daily. 2 tablespoons in the morning and 1 tablespoon in the evening., Historical Med    metoCLOPramide (REGLAN) 5 MG tablet Take 1 tablet (5 mg total) by mouth 3 (three) times daily before meals., Starting Sun 01/20/2016, Print    pantoprazole (PROTONIX) 40 MG tablet Take 1 tablet (40 mg total) by mouth 2 (two) times daily., Starting Sun 01/20/2016, Print    sucralfate (CARAFATE)  1 g tablet Take 1 tablet (1 g total) by mouth 4 (four) times daily -  with meals and at bedtime., Starting Thu 07/24/2016, Print      STOP taking these medications     loperamide (IMODIUM A-D) 2 MG tablet      prochlorperazine (COMPAZINE) 10 MG tablet      promethazine (PHENERGAN) 25 MG suppository          DISCHARGE INSTRUCTIONS:    Follow-up with PMD one week  If you experience worsening of your admission symptoms, develop shortness of breath, life threatening emergency, suicidal or homicidal thoughts you must seek medical attention immediately by calling 911 or calling your MD immediately  if symptoms less severe.  You Must read complete instructions/literature along with all the possible adverse reactions/side effects for all the Medicines you take and that have been prescribed to you. Take any new Medicines after you have completely understood and accept all the possible adverse reactions/side effects.   Please note  You were cared for by a hospitalist during your hospital stay. If you have any questions about your discharge medications or the care you received while you were in the hospital after you are discharged, you can call the unit and asked to speak with the hospitalist on call if the hospitalist that took care of you is not available. Once you are discharged, your primary care physician will handle any further medical issues. Please note that NO REFILLS for any discharge medications will be authorized once you are discharged, as it is imperative that you return to your primary care physician (or establish a relationship with a primary care physician if you do not have one) for your aftercare needs so that they can reassess your need for medications and monitor your lab values.    Today   CHIEF COMPLAINT:   Chief Complaint  Patient presents with  . Abdominal Pain  . Nausea  . Emesis  . Diarrhea    HISTORY OF PRESENT ILLNESS:  Lynn Larson  is a 51 y.o. female with a known history of Previous hospitalizations with nausea vomiting and abdominal pain   VITAL SIGNS:  Blood pressure 128/73, pulse 63, temperature 97.9 F (36.6 C), temperature source Oral, resp. rate 16, height 5\' 3"  (1.6 m), weight 69.9 kg (154 lb), SpO2 98 %.    PHYSICAL EXAMINATION:  GENERAL:  51 y.o.-year-old patient lying in the  bed with no acute distress.  EYES: Pupils equal, round, reactive to light and accommodation. No scleral icterus. Extraocular muscles intact.  HEENT: Head atraumatic, normocephalic. Oropharynx and nasopharynx clear.  NECK:  Supple, no jugular venous distention. No thyroid enlargement, no tenderness.  LUNGS: Normal breath sounds bilaterally, no wheezing, rales,rhonchi or crepitation. No use of accessory muscles of respiration.  CARDIOVASCULAR: S1, S2 normal. No murmurs, rubs, or gallops.  ABDOMEN: Soft, non-tender, non-distended. Bowel sounds present. No organomegaly or mass.  EXTREMITIES: No pedal edema, cyanosis, or clubbing.  NEUROLOGIC: Cranial nerves II through XII are intact. Muscle strength 5/5 in all extremities. Sensation intact. Gait not checked.  PSYCHIATRIC: The patient is alert and oriented x 3.  SKIN: No obvious rash, lesion, or ulcer.   DATA REVIEW:   CBC  Recent Labs Lab 08/13/16 0424  WBC 10.5  HGB 13.0  HCT 37.9  PLT 404    Chemistries   Recent Labs Lab 08/12/16 0441 08/13/16 0424  NA 140 139  K 3.1* 3.4*  CL 108 108  CO2 26 24  GLUCOSE 94  88  BUN 10 7  CREATININE 0.65 0.62  CALCIUM 8.5* 8.6*  MG 1.9 1.9  AST 21  --   ALT 21  --   ALKPHOS 72  --   BILITOT 1.1  --      Microbiology Results  Results for orders placed or performed during the hospital encounter of 08/11/16  Gastrointestinal Panel by PCR , Stool     Status: None   Collection Time: 08/11/16 11:38 PM  Result Value Ref Range Status   Campylobacter species NOT DETECTED NOT DETECTED Final   Plesimonas shigelloides NOT DETECTED NOT DETECTED Final   Salmonella species NOT DETECTED NOT DETECTED Final   Yersinia enterocolitica NOT DETECTED NOT DETECTED Final   Vibrio species NOT DETECTED NOT DETECTED Final   Vibrio cholerae NOT DETECTED NOT DETECTED Final   Enteroaggregative E coli (EAEC) NOT DETECTED NOT DETECTED Final   Enteropathogenic E coli (EPEC) NOT DETECTED NOT DETECTED Final    Enterotoxigenic E coli (ETEC) NOT DETECTED NOT DETECTED Final   Shiga like toxin producing E coli (STEC) NOT DETECTED NOT DETECTED Final   Shigella/Enteroinvasive E coli (EIEC) NOT DETECTED NOT DETECTED Final   Cryptosporidium NOT DETECTED NOT DETECTED Final   Cyclospora cayetanensis NOT DETECTED NOT DETECTED Final   Entamoeba histolytica NOT DETECTED NOT DETECTED Final   Giardia lamblia NOT DETECTED NOT DETECTED Final   Adenovirus F40/41 NOT DETECTED NOT DETECTED Final   Astrovirus NOT DETECTED NOT DETECTED Final   Norovirus GI/GII NOT DETECTED NOT DETECTED Final   Rotavirus A NOT DETECTED NOT DETECTED Final   Sapovirus (I, II, IV, and V) NOT DETECTED NOT DETECTED Final  C difficile quick scan w PCR reflex     Status: None   Collection Time: 08/11/16 11:38 PM  Result Value Ref Range Status   C Diff antigen NEGATIVE NEGATIVE Final   C Diff toxin NEGATIVE NEGATIVE Final   C Diff interpretation No C. difficile detected.  Final    Management plans discussed with the patient, and she is in agreement.  CODE STATUS:  Code Status History    Date Active Date Inactive Code Status Order ID Comments User Context   08/12/2016  1:44 AM 08/14/2016  2:25 PM Full Code [id number]  [employer], DO Inpatient   08/01/2016 12:39 PM 08/02/2016  9:00 PM Full Code [id number]  Enid Baas, MD Inpatient   07/28/2016  2:28 PM 07/30/2016  8:24 PM Full Code [id number]  Alford Highland, MD ED   07/22/2016 11:48 PM 07/24/2016  5:59 PM Full Code [id number]  Oralia Manis, MD Inpatient   01/16/2016  9:33 PM 01/20/2016  3:07 PM Full Code [id number]  Houston Siren, MD ED   08/14/2015  5:33 PM 08/17/2015  2:44 PM Full Code [id number]  [name], MD Inpatient   02/15/2015  8:31 PM 02/18/2015 10:31 PM Full Code [id number]  Katha Hamming, MD ED      TOTAL TIME TAKING CARE OF THIS PATIENT: 35 minutes.    Alford Highland M.D on 08/14/2016 at 4:35 PM  Between 7am to 6pm - Pager - [phone]  After 6pm go to  [email or web address] - Social research officer, government  [employer] Office  [phone]  CC: Primary care physician; [facility]

## 2016-08-14 NOTE — Care Management (Signed)
Patient admitted withabdominal pain .  Patient self pay.  Patient lives with significant other. Patient states that she is employed, denies any issues with transportation.  Patient does not have PCP.  [facility], however she states that she has only partially complete the application, and not pursued submitting it.  Patient obtained her medications from Medication management at discharge previous admission.  Patient to obtain zofran and Bentyl from Medication Management at discharge this admission.  Other medications that are OTC and narcotics will have to be obtained from a pharmacy of her choice.  RNCM signing off

## 2016-08-14 NOTE — Progress Notes (Signed)
Discharge instructions reviewed with patient including new medications prescribed.  Understanding was verbalized and all questions were answered.  Work note provided.  Patient discharged home via wheelchair in stable condition escorted by nursing staff.

## 2016-08-15 NOTE — Anesthesia Postprocedure Evaluation (Signed)
Anesthesia Post Note  Patient: Ander GasterJudy Seppala  Procedure(s) Performed: Procedure(s) (LRB): COLONOSCOPY WITH PROPOFOL ([name]) ESOPHAGOGASTRODUODENOSCOPY (EGD) WITH PROPOFOL ([name])  Patient location during evaluation: Endoscopy Anesthesia Type: General Level of consciousness: awake and alert Pain management: pain level controlled Vital Signs Assessment: post-procedure vital signs reviewed and stable Respiratory status: spontaneous breathing, nonlabored ventilation, respiratory function stable and patient connected to nasal cannula oxygen Cardiovascular status: blood pressure returned to baseline and stable Postop Assessment: no signs of nausea or vomiting Anesthetic complications: no     Last Vitals:  Vitals:   08/14/16 0538 08/14/16 0747  BP: 118/69 128/73  Pulse: 61 63  Resp: 18 16  Temp: 36.7 C 36.6 C    Last Pain:  Vitals:   08/14/16 1002  TempSrc:   PainSc: 5                  Lenard SimmerAndrew Creed Kail

## 2016-08-16 LAB — H. PYLORI ANTIGEN, STOOL: H. Pylori Stool Ag, Eia: NEGATIVE

## 2016-09-01 ENCOUNTER — Telehealth: Payer: Self-pay

## 2016-09-01 NOTE — Telephone Encounter (Signed)
-----   Message from Wyline MoodKiran Anna, MD sent at 09/01/2016 10:24 AM EDT ----- Inform all biopsies are normal except stomach which shows gastritis. Negative stool for H pylori . F/u with GI clinic for further evaluation

## 2016-09-01 NOTE — Telephone Encounter (Signed)
Unable to leave VM due to phone glitch. Will attempt callback on 09/02/16.

## 2016-09-05 NOTE — Telephone Encounter (Signed)
Spoke with patient and gave results per Dr. Tobi BastosAnna.

## 2016-11-17 ENCOUNTER — Observation Stay
Admission: EM | Admit: 2016-11-17 | Discharge: 2016-11-20 | Disposition: A | Discharge status: Home / Self Care | Payer: Self-pay | Attending: Internal Medicine | Admitting: Internal Medicine

## 2016-11-17 ENCOUNTER — Encounter: Payer: Self-pay | Admitting: Emergency Medicine

## 2016-11-17 DIAGNOSIS — Z8719 Personal history of other diseases of the digestive system: Secondary | ICD-10-CM | POA: Insufficient documentation

## 2016-11-17 DIAGNOSIS — F1210 Cannabis abuse, uncomplicated: Secondary | ICD-10-CM | POA: Insufficient documentation

## 2016-11-17 DIAGNOSIS — R109 Unspecified abdominal pain: Secondary | ICD-10-CM

## 2016-11-17 DIAGNOSIS — Z885 Allergy status to narcotic agent status: Secondary | ICD-10-CM | POA: Insufficient documentation

## 2016-11-17 DIAGNOSIS — Z888 Allergy status to other drugs, medicaments and biological substances status: Secondary | ICD-10-CM | POA: Insufficient documentation

## 2016-11-17 DIAGNOSIS — K449 Diaphragmatic hernia without obstruction or gangrene: Secondary | ICD-10-CM | POA: Insufficient documentation

## 2016-11-17 DIAGNOSIS — Z803 Family history of malignant neoplasm of breast: Secondary | ICD-10-CM | POA: Insufficient documentation

## 2016-11-17 DIAGNOSIS — I10 Essential (primary) hypertension: Secondary | ICD-10-CM | POA: Insufficient documentation

## 2016-11-17 DIAGNOSIS — Z87891 Personal history of nicotine dependence: Secondary | ICD-10-CM | POA: Insufficient documentation

## 2016-11-17 DIAGNOSIS — I700 Atherosclerosis of aorta: Secondary | ICD-10-CM | POA: Insufficient documentation

## 2016-11-17 DIAGNOSIS — J45909 Unspecified asthma, uncomplicated: Secondary | ICD-10-CM | POA: Insufficient documentation

## 2016-11-17 DIAGNOSIS — R112 Nausea with vomiting, unspecified: Principal | ICD-10-CM | POA: Diagnosis present

## 2016-11-17 DIAGNOSIS — Z8639 Personal history of other endocrine, nutritional and metabolic disease: Secondary | ICD-10-CM | POA: Insufficient documentation

## 2016-11-17 DIAGNOSIS — Z8041 Family history of malignant neoplasm of ovary: Secondary | ICD-10-CM | POA: Insufficient documentation

## 2016-11-17 DIAGNOSIS — Z9889 Other specified postprocedural states: Secondary | ICD-10-CM | POA: Insufficient documentation

## 2016-11-17 DIAGNOSIS — Z79899 Other long term (current) drug therapy: Secondary | ICD-10-CM | POA: Insufficient documentation

## 2016-11-17 DIAGNOSIS — Z9049 Acquired absence of other specified parts of digestive tract: Secondary | ICD-10-CM | POA: Insufficient documentation

## 2016-11-17 LAB — COMPREHENSIVE METABOLIC PANEL
ALBUMIN: 4.8 g/dL (ref 3.5–5.0)
ALT: 14 U/L (ref 14–54)
ANION GAP: 10 (ref 5–15)
AST: 18 U/L (ref 15–41)
Alkaline Phosphatase: 98 U/L (ref 38–126)
BUN: 13 mg/dL (ref 6–20)
CALCIUM: 9.7 mg/dL (ref 8.9–10.3)
CO2: 27 mmol/L (ref 22–32)
Chloride: 102 mmol/L (ref 101–111)
Creatinine, Ser: 0.66 mg/dL (ref 0.44–1.00)
GFR calc Af Amer: >60 mL/min (ref >60)
GFR calc non Af Amer: >60 mL/min (ref >60)
GLUCOSE: 127 mg/dL — AB (ref 65–99)
POTASSIUM: 3.6 mmol/L (ref 3.5–5.1)
SODIUM: 139 mmol/L (ref 135–145)
TOTAL PROTEIN: 8.3 g/dL — AB (ref 6.5–8.1)
Total Bilirubin: 0.5 mg/dL (ref 0.3–1.2)

## 2016-11-17 LAB — CBC
HCT: 46.4 % (ref 35.0–47.0)
HEMOGLOBIN: 15.6 g/dL (ref 12.0–16.0)
MCH: 28.9 pg (ref 26.0–34.0)
MCHC: 33.5 g/dL (ref 32.0–36.0)
MCV: 86 fL (ref 80.0–100.0)
Platelets: 422 10*3/uL (ref 150–440)
RBC: 5.4 MIL/uL — AB (ref 3.80–5.20)
RDW: 15.4 % — ABNORMAL HIGH (ref 11.5–14.5)
WBC: 18.6 10*3/uL — ABNORMAL HIGH (ref 3.6–11.0)

## 2016-11-17 LAB — TROPONIN I

## 2016-11-17 LAB — LIPASE, BLOOD: Lipase: 29 U/L (ref 11–51)

## 2016-11-17 MED ORDER — HALOPERIDOL LACTATE 5 MG/ML IJ SOLN
5.0000 mg | Freq: Once | INTRAMUSCULAR | Status: AC
  Administered 2016-11-17: 5 mg via INTRAVENOUS

## 2016-11-17 MED ORDER — LORAZEPAM 2 MG/ML IJ SOLN
1.0000 mg | Freq: Once | INTRAMUSCULAR | Status: AC
  Administered 2016-11-17: 1 mg via INTRAVENOUS
  Filled 2016-11-17: qty 1

## 2016-11-17 MED ORDER — HALOPERIDOL LACTATE 5 MG/ML IJ SOLN
INTRAMUSCULAR | Status: AC
  Administered 2016-11-17: 5 mg via INTRAVENOUS
  Filled 2016-11-17: qty 1

## 2016-11-17 MED ORDER — SODIUM CHLORIDE 0.9 % IV BOLUS (SEPSIS)
1000.0000 mL | Freq: Once | INTRAVENOUS | Status: AC
  Administered 2016-11-17: 1000 mL via INTRAVENOUS

## 2016-11-17 MED ORDER — ONDANSETRON HCL 4 MG/2ML IJ SOLN
4.0000 mg | Freq: Once | INTRAMUSCULAR | Status: AC
  Administered 2016-11-17: 4 mg via INTRAVENOUS
  Filled 2016-11-17: qty 2

## 2016-11-17 NOTE — ED Triage Notes (Signed)
Pt presents to ED from home via EMS  c/o ABD pain with n/v . EMS also states systolic BP >200 .

## 2016-11-17 NOTE — ED Provider Notes (Addendum)
[facility] Emergency Department Provider Note  ____________________________________________   I have reviewed the triage vital signs and the nursing notes.   HISTORY  Chief Complaint Abdominal Pain; Nausea; Emesis; and Hypertension    HPI Lynn Larson is a 51 y.o. female presents today complaining of another episode of vomiting and abdominal pain. Patient has had multiple different visits for this sometimes several times a month. She's been admitted for this before. She has had multiple CT scans. His been hypothesized that she has hyperemesis related to marijuana abuse and she states she did use marijuana yesterday. She did not however use at this morning. She states that she is having the same flare as she has had before. She denies diarrhea. She states that she has hadpositive epigastric pain today. She states this is the same pain she has had. Patient did have a recent endoscopy which showed no ulceration. Patient is very upset and yelling and yell vomiting he is anxious and she is requesting Dilaudid.     Past Medical History:  Diagnosis Date  . Asthma   . Gastric ulcer   . Hypertension     Patient Active Problem List   Diagnosis Date Noted  . Acute gastroenteritis 08/01/2016  . Nausea and vomiting 07/28/2016  . HTN (hypertension) 07/22/2016  . Asthma 07/22/2016  . Epigastric abdominal pain   . Colitis 01/16/2016  . Hematemesis 08/14/2015  . Nausea & vomiting 02/15/2015    Past Surgical History:  Procedure Laterality Date  . CESAREAN SECTION    . CHOLECYSTECTOMY    . COLONOSCOPY WITH PROPOFOL [name] 08/13/2016   Procedure: COLONOSCOPY WITH PROPOFOL;  Surgeon: Wyline MoodKiran Anna, MD;  [facility];  Service: Endoscopy;  Laterality: [name];  . ESOPHAGOGASTRODUODENOSCOPY (EGD) WITH PROPOFOL [name] 08/15/2015   Procedure: ESOPHAGOGASTRODUODENOSCOPY (EGD) WITH PROPOFOL;  Surgeon: Christena DeemMartin U Skulskie, MD;  [facility];  Service: Endoscopy;   Laterality: [name];  . ESOPHAGOGASTRODUODENOSCOPY (EGD) WITH PROPOFOL [name] 08/13/2016   Procedure: ESOPHAGOGASTRODUODENOSCOPY (EGD) WITH PROPOFOL;  Surgeon: Wyline MoodKiran Anna, MD;  Location: [facility] ENDOSCOPY;  Service: Endoscopy;  Laterality: [name];    Prior to Admission medications   Medication Sig Start Date End Date Taking? Authorizing Provider  acetaminophen (TYLENOL) 325 MG tablet Take 2 tablets (650 mg total) by mouth every 6 (six) hours as needed for mild pain (or Fever >/= 101). 07/24/16   Ramonita LabGouru, Aruna, MD  acidophilus (RISAQUAD) CAPS capsule Take 2 capsules by mouth 3 (three) times daily. 08/14/16   Alford HighlandWieting, Richard, MD  alum & mag hydroxide-simeth (MAALOX/MYLANTA) 200-200-20 MG/5ML suspension Take 15-30 mLs by mouth 2 (two) times daily. 2 tablespoons in the morning and 1 tablespoon in the evening.    [provider]  dicyclomine (BENTYL) 20 MG tablet Take 1 tablet (20 mg total) by mouth 4 (four) times daily -  before meals and at bedtime. 08/14/16   Alford HighlandWieting, Richard, MD  feeding supplement, ENSURE ENLIVE, (ENSURE ENLIVE) LIQD Take 237 mLs by mouth 2 (two) times daily between meals. 08/14/16   Alford HighlandWieting, Richard, MD  metoCLOPramide (REGLAN) 5 MG tablet Take 1 tablet (5 mg total) by mouth 3 (three) times daily before meals. 01/20/16   Enid BaasKalisetti, Radhika, MD  ondansetron (ZOFRAN) 4 MG tablet Take 1 tablet (4 mg total) by mouth every 8 (eight) hours as needed for nausea or vomiting. 08/14/16 08/14/17  Alford HighlandWieting, Richard, MD  oxyCODONE (OXY IR/ROXICODONE) 5 MG immediate release tablet Take 1 tablet (5 mg total) by mouth every 6 (six) hours as needed for  moderate pain. 08/14/16   Alford Highland, MD  pantoprazole (PROTONIX) 40 MG tablet Take 1 tablet (40 mg total) by mouth 2 (two) times daily. 01/20/16   Enid Baas, MD  sucralfate (CARAFATE) 1 g tablet Take 1 tablet (1 g total) by mouth 4 (four) times daily -  with meals and at bedtime. 07/24/16   Ramonita Lab, MD    Allergies Chantix [varenicline] and  Morphine and related  Family History  Problem Relation Age of Onset  . Breast cancer Maternal Grandmother   . Ovarian cancer Maternal Grandmother     Social History Social History  Substance Use Topics  . Smoking status: Former Smoker    Packs/day: 0.50    Years: 30.00    Types: Cigarettes  . Smokeless tobacco: Never Used  . Alcohol use No    Review of Systems Constitutional: No fever/chills Eyes: No visual changes. ENT: No sore throat. No stiff neck no neck pain Cardiovascular: Denies chest pain. Respiratory: Denies shortness of breath. Gastrointestinal:   +vomiting.  No diarrhea.  No constipation. Genitourinary: Negative for dysuria. Musculoskeletal: Negative lower extremity swelling Skin: Negative for rash. Neurological: Negative for severe headaches, focal weakness or numbness.   ____________________________________________   PHYSICAL EXAM:  VITAL SIGNS: ED Triage Vitals  Enc Vitals Group     BP 11/17/16 1911 (!) 180/101     Pulse Rate 11/17/16 1911 87     Resp 11/17/16 1911 (!) 22     Temp 11/17/16 1911 98.3 F (36.8 C)     Temp Source 11/17/16 1911 Oral     SpO2 11/17/16 1911 96 %     Weight 11/17/16 1912 160 lb (72.6 kg)     Height 11/17/16 1912 5' (1.524 m)     Head Circumference --      Peak Flow --      Pain Score 11/17/16 1908 10     Pain Loc --      Pain Edu? --      Excl. in GC? --     Constitutional: Alert and oriented. Very anxious and upset, yelling and very demonstrative but clinically, does not appear to be toxic medically Eyes: Conjunctivae are normal Head: Atraumatic HEENT: No congestion/rhinnorhea. Mucous membranes are moist.  Oropharynx non-erythematous Neck:   Nontender with no meningismus, no masses, no stridor Cardiovascular: Normal rate, regular rhythm. Grossly normal heart sounds.  Good peripheral circulation. Respiratory: Normal respiratory effort.  No retractions. Lungs CTAB. Abdominal: Soft and minimal epigastric  distractible tenderness. No distention. No guarding no rebound Back:  There is no focal tenderness or step off.  there is no midline tenderness there are no lesions noted. there is no CVA tenderness Musculoskeletal: No lower extremity tenderness, no upper extremity tenderness. No joint effusions, no DVT signs strong distal pulses no edema Neurologic:  Normal speech and language. No gross focal neurologic deficits are appreciated.  Skin:  Skin is warm, dry and intact. No rash noted. Psychiatric: Mood and affect are very anxious and upset. Speech and behavior are normal.  ____________________________________________   LABS (all labs ordered are listed, but only abnormal results are displayed)  Labs Reviewed  COMPREHENSIVE METABOLIC PANEL - Abnormal; Notable for the following:       Result Value   Glucose, Bld 127 (*)    Total Protein 8.3 (*)    All other components within normal limits  CBC - Abnormal; Notable for the following:    WBC 18.6 (*)    RBC 5.40 (*)  RDW 15.4 (*)    All other components within normal limits  LIPASE, BLOOD  TROPONIN I  URINALYSIS, COMPLETE (UACMP) WITH MICROSCOPIC  URINE DRUG SCREEN, QUALITATIVE ([facility] ONLY)   ____________________________________________  EKG  I personally interpreted any EKGs ordered by me or triage Sinus rhythm rate 76 bpm no acute ST elevation or ST depression normal axis unremarkable EKG ____________________________________________  RADIOLOGY  I reviewed any imaging ordered by me or triage that were performed during my shift and, if possible, patient and/or family made aware of any abnormal findings. ____________________________________________   PROCEDURES  Procedure(s) performed: None  Procedures  Critical Care performed: None  ____________________________________________   INITIAL IMPRESSION / ASSESSMENT AND PLAN / ED COURSE  Pertinent labs & imaging results that were available during my care of the patient were  reviewed by me and considered in my medical decision making (see chart for details).  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 Ileus soft, and reproducible epigastric tenderness is all that is noted. I don't think is evidence clinically to know that the patient has perforated abdomen or other acute surgical pathology and I don't think imaging is acutely necessary. This is a chronic recurrent problem for this patient. Her white count is 18 which is actually less than it was last time she had one of these episodes. I gave the patient Haldol which is an excellent antiemetics and she is much calmer at this time, she states she would like something for her nerves however. We'll try Ativan as well. Patient's abdomen remains benign, her blood pressure went from almost 200 under 150 after anxiolytic medication which I take to be a positive prognostic sign. Her cardiogram is reassuring this recently no evidence at this time of ACS.  ----------------------------------------- 10:01 PM on 11/17/2016 -----------------------------------------  Patient resting in bed in no acute distress compared to when she came in. She feels that she needs to be admitted the hospital because she cannot imagine eating anything she states she needs something else for nausea. She continues to request Dilaudid which I am not going to give her because of the chronic nature of her complaint and the hospital policy for chronic pain however, I have offered her oral pain medication which she declines. We will discuss with the hospitalist doctor as patient has required admission for this before and she states there is no I am going to get her better. Obviously, somewhat difficult to compel someone to eat if they state they're too nauseated to do so and will try another antiemetics here.    ____________________________________________   FINAL CLINICAL IMPRESSION(S) / ED  DIAGNOSES  Final diagnoses:  None      This chart was dictated using voice recognition software.  Despite best efforts to proofread,  errors can occur which can change meaning.      Jeanmarie Plant, MD 11/17/16 2039    Jeanmarie Plant, MD 11/17/16 2203

## 2016-11-18 ENCOUNTER — Observation Stay: Payer: Self-pay

## 2016-11-18 ENCOUNTER — Encounter: Payer: Self-pay | Admitting: Radiology

## 2016-11-18 LAB — GASTROINTESTINAL PANEL BY PCR, STOOL (REPLACES STOOL CULTURE)
ASTROVIRUS: NOT DETECTED
Adenovirus F40/41: NOT DETECTED
Campylobacter species: NOT DETECTED
Cryptosporidium: NOT DETECTED
Cyclospora cayetanensis: NOT DETECTED
ENTAMOEBA HISTOLYTICA: NOT DETECTED
ENTEROAGGREGATIVE E COLI (EAEC): NOT DETECTED
ENTEROTOXIGENIC E COLI (ETEC): NOT DETECTED
Enteropathogenic E coli (EPEC): DETECTED — AB
GIARDIA LAMBLIA: NOT DETECTED
NOROVIRUS GI/GII: NOT DETECTED
Plesimonas shigelloides: NOT DETECTED
Rotavirus A: NOT DETECTED
SALMONELLA SPECIES: NOT DETECTED
Sapovirus (I, II, IV, and V): NOT DETECTED
Shiga like toxin producing E coli (STEC): NOT DETECTED
Shigella/Enteroinvasive E coli (EIEC): NOT DETECTED
VIBRIO CHOLERAE: NOT DETECTED
Vibrio species: NOT DETECTED
Yersinia enterocolitica: NOT DETECTED

## 2016-11-18 LAB — CBC
HEMATOCRIT: 44.5 % (ref 35.0–47.0)
Hemoglobin: 15.4 g/dL (ref 12.0–16.0)
MCH: 29.1 pg (ref 26.0–34.0)
MCHC: 34.6 g/dL (ref 32.0–36.0)
MCV: 84.1 fL (ref 80.0–100.0)
PLATELETS: 391 10*3/uL (ref 150–440)
RBC: 5.3 MIL/uL — ABNORMAL HIGH (ref 3.80–5.20)
RDW: 15.1 % — AB (ref 11.5–14.5)
WBC: 16.4 10*3/uL — ABNORMAL HIGH (ref 3.6–11.0)

## 2016-11-18 LAB — BASIC METABOLIC PANEL
Anion gap: 10 (ref 5–15)
BUN: 9 mg/dL (ref 6–20)
CHLORIDE: 104 mmol/L (ref 101–111)
CO2: 22 mmol/L (ref 22–32)
CREATININE: 0.59 mg/dL (ref 0.44–1.00)
Calcium: 9.5 mg/dL (ref 8.9–10.3)
GFR calc Af Amer: >60 mL/min (ref >60)
GFR calc non Af Amer: >60 mL/min (ref >60)
Glucose, Bld: 140 mg/dL — ABNORMAL HIGH (ref 65–99)
Potassium: 3.2 mmol/L — ABNORMAL LOW (ref 3.5–5.1)
Sodium: 136 mmol/L (ref 135–145)

## 2016-11-18 LAB — GLUCOSE, CAPILLARY
GLUCOSE-CAPILLARY: 133 mg/dL — AB (ref 65–99)
GLUCOSE-CAPILLARY: 154 mg/dL — AB (ref 65–99)
GLUCOSE-CAPILLARY: 120 mg/dL — AB (ref 65–99)
Glucose-Capillary: 139 mg/dL — ABNORMAL HIGH (ref 65–99)
Glucose-Capillary: 123 mg/dL — ABNORMAL HIGH (ref 65–99)
Glucose-Capillary: 138 mg/dL — ABNORMAL HIGH (ref 65–99)

## 2016-11-18 LAB — URINALYSIS, COMPLETE (UACMP) WITH MICROSCOPIC
Bilirubin Urine: NEGATIVE
GLUCOSE, UA: 50 mg/dL — AB
KETONES UR: 20 mg/dL — AB
Leukocytes, UA: NEGATIVE
NITRITE: NEGATIVE
PH: 8 (ref 5.0–8.0)
PROTEIN: NEGATIVE mg/dL
Specific Gravity, Urine: 1.012 (ref 1.005–1.030)

## 2016-11-18 LAB — URINE DRUG SCREEN, QUALITATIVE (ARMC ONLY)
AMPHETAMINES, UR SCREEN: NOT DETECTED
Barbiturates, Ur Screen: NOT DETECTED
Benzodiazepine, Ur Scrn: NOT DETECTED
CANNABINOID 50 NG, UR ~~LOC~~: POSITIVE — AB
Cocaine Metabolite,Ur ~~LOC~~: NOT DETECTED
MDMA (ECSTASY) UR SCREEN: NOT DETECTED
Methadone Scn, Ur: NOT DETECTED
OPIATE, UR SCREEN: NOT DETECTED
PHENCYCLIDINE (PCP) UR S: NOT DETECTED
Tricyclic, Ur Screen: NOT DETECTED

## 2016-11-18 LAB — C DIFFICILE QUICK SCREEN W PCR REFLEX
C DIFFICLE (CDIFF) ANTIGEN: NEGATIVE
C Diff interpretation: NOT DETECTED
C Diff toxin: NEGATIVE

## 2016-11-18 MED ORDER — SODIUM CHLORIDE 0.9 % IV SOLN
INTRAVENOUS | Status: DC
  Administered 2016-11-18 – 2016-11-19 (×3): via INTRAVENOUS

## 2016-11-18 MED ORDER — ACETAMINOPHEN 325 MG PO TABS: 650.0000 mg | ORAL_TABLET | Freq: Four times a day (QID) | ORAL | Status: DC | PRN

## 2016-11-18 MED ORDER — HYDRALAZINE HCL 20 MG/ML IJ SOLN
5.0000 mg | Freq: Once | INTRAMUSCULAR | Status: AC
  Administered 2016-11-18: 5 mg via INTRAVENOUS
  Filled 2016-11-18: qty 1

## 2016-11-18 MED ORDER — ALBUTEROL SULFATE (2.5 MG/3ML) 0.083% IN NEBU
2.5000 mg | INHALATION_SOLUTION | Freq: Four times a day (QID) | RESPIRATORY_TRACT | Status: DC | PRN
Start: 2016-11-18 — End: 2016-11-20

## 2016-11-18 MED ORDER — HYDRALAZINE HCL 20 MG/ML IJ SOLN
2.0000 mg | Freq: Once | INTRAMUSCULAR | Status: DC
Start: 2016-11-18 — End: 2016-11-18

## 2016-11-18 MED ORDER — METOCLOPRAMIDE HCL 5 MG/ML IJ SOLN
5.0000 mg | Freq: Three times a day (TID) | INTRAMUSCULAR | Status: DC | PRN
  Administered 2016-11-18 – 2016-11-19 (×3): 5 mg via INTRAVENOUS
  Filled 2016-11-18 (×3): qty 2

## 2016-11-18 MED ORDER — SUCRALFATE 1 G PO TABS
1.0000 g | ORAL_TABLET | Freq: Three times a day (TID) | ORAL | Status: DC
  Administered 2016-11-18 – 2016-11-20 (×7): 1 g via ORAL
  Filled 2016-11-18 (×7): qty 1

## 2016-11-18 MED ORDER — ALUM & MAG HYDROXIDE-SIMETH 200-200-20 MG/5ML PO SUSP
15.0000 mL | Freq: Two times a day (BID) | ORAL | Status: DC
  Administered 2016-11-18 – 2016-11-20 (×5): 30 mL via ORAL
  Filled 2016-11-18 (×5): qty 30

## 2016-11-18 MED ORDER — INSULIN ASPART 100 UNIT/ML ~~LOC~~ SOLN
0.0000 [IU] | SUBCUTANEOUS | Status: DC
  Administered 2016-11-18 – 2016-11-19 (×2): 2 [IU] via SUBCUTANEOUS
  Administered 2016-11-19: 3 [IU] via SUBCUTANEOUS
  Administered 2016-11-19: 2 [IU] via SUBCUTANEOUS
  Administered 2016-11-19: 3 [IU] via SUBCUTANEOUS
  Administered 2016-11-20 (×2): 2 [IU] via SUBCUTANEOUS
  Filled 2016-11-18 (×2): qty 2
  Filled 2016-11-18: qty 3
  Filled 2016-11-18: qty 2
  Filled 2016-11-18 (×2): qty 3
  Filled 2016-11-18 (×3): qty 2

## 2016-11-18 MED ORDER — POTASSIUM CHLORIDE CRYS ER 20 MEQ PO TBCR
40.0000 meq | EXTENDED_RELEASE_TABLET | Freq: Two times a day (BID) | ORAL | Status: DC
  Administered 2016-11-18 – 2016-11-19 (×4): 40 meq via ORAL
  Filled 2016-11-18 (×4): qty 2

## 2016-11-18 MED ORDER — ONDANSETRON HCL 4 MG PO TABS
4.0000 mg | ORAL_TABLET | Freq: Four times a day (QID) | ORAL | Status: DC | PRN
  Filled 2016-11-18: qty 1

## 2016-11-18 MED ORDER — MAGNESIUM CITRATE PO SOLN
1.0000 | Freq: Once | ORAL | Status: DC | PRN
  Filled 2016-11-18: qty 296

## 2016-11-18 MED ORDER — BISACODYL 5 MG PO TBEC: 5.0000 mg | DELAYED_RELEASE_TABLET | Freq: Every day | ORAL | Status: DC | PRN

## 2016-11-18 MED ORDER — ENOXAPARIN SODIUM 40 MG/0.4ML ~~LOC~~ SOLN
40.0000 mg | SUBCUTANEOUS | Status: DC
  Administered 2016-11-18 – 2016-11-19 (×2): 40 mg via SUBCUTANEOUS
  Filled 2016-11-18 (×2): qty 0.4

## 2016-11-18 MED ORDER — SENNOSIDES-DOCUSATE SODIUM 8.6-50 MG PO TABS: 1.0000 | ORAL_TABLET | Freq: Every evening | ORAL | Status: DC | PRN

## 2016-11-18 MED ORDER — IPRATROPIUM BROMIDE 0.02 % IN SOLN: 0.5000 mg | Freq: Four times a day (QID) | RESPIRATORY_TRACT | Status: DC | PRN

## 2016-11-18 MED ORDER — IOPAMIDOL (ISOVUE-300) INJECTION 61%
100.0000 mL | Freq: Once | INTRAVENOUS | Status: AC | PRN
  Administered 2016-11-18: 100 mL via INTRAVENOUS

## 2016-11-18 MED ORDER — ACETAMINOPHEN 325 MG PO TABS
650.0000 mg | ORAL_TABLET | Freq: Four times a day (QID) | ORAL | Status: DC | PRN
  Administered 2016-11-18 – 2016-11-20 (×2): 650 mg via ORAL
  Filled 2016-11-18 (×2): qty 2

## 2016-11-18 MED ORDER — ONDANSETRON HCL 4 MG/2ML IJ SOLN
4.0000 mg | Freq: Four times a day (QID) | INTRAMUSCULAR | Status: DC | PRN
  Administered 2016-11-19 – 2016-11-20 (×4): 4 mg via INTRAVENOUS
  Filled 2016-11-18 (×4): qty 2

## 2016-11-18 MED ORDER — DICYCLOMINE HCL 20 MG PO TABS
20.0000 mg | ORAL_TABLET | Freq: Three times a day (TID) | ORAL | Status: DC
  Administered 2016-11-18 – 2016-11-20 (×8): 20 mg via ORAL
  Filled 2016-11-18 (×9): qty 1

## 2016-11-18 MED ORDER — RISAQUAD PO CAPS
2.0000 | ORAL_CAPSULE | Freq: Three times a day (TID) | ORAL | Status: DC
  Administered 2016-11-18 – 2016-11-20 (×7): 2 via ORAL
  Filled 2016-11-18 (×7): qty 2

## 2016-11-18 MED ORDER — ACETAMINOPHEN 650 MG RE SUPP: 650.0000 mg | Freq: Four times a day (QID) | RECTAL | Status: DC | PRN

## 2016-11-18 NOTE — Progress Notes (Signed)
Made Dr. Elisabeth PigeonVachhani aware that patient positive for enteropathogenic ecoli.  Per Jason FilaSara Wall, RN patient does not need isolation if she is continent.  Orson Apeanielle Fong Mccarry, RN

## 2016-11-18 NOTE — Progress Notes (Signed)
[facility] - [place] at [facility]   PATIENT NAME: Lynn Larson    MR#:  [id number]  DATE OF BIRTH:  05-01-1966  SUBJECTIVE:  CHIEF COMPLAINT:   Chief Complaint  Patient presents with  . Abdominal Pain  . Nausea  . Emesis  . Hypertension     Recurrent ER visits for nausea and abdominal pain.   She had EGD and colonoscopy done in feb 2018- showed gastritis and esophagitis.   Claims to be taking sucralfate and protonix daily as advised.   No vomit since in the room in hospital. [facility] 3-4 loose BM in night"  REVIEW OF SYSTEMS:  CONSTITUTIONAL: No fever, fatigue or weakness.  EYES: No blurred or double vision.  EARS, NOSE, AND THROAT: No tinnitus or ear pain.  RESPIRATORY: No cough, shortness of breath, wheezing or hemoptysis.  CARDIOVASCULAR: No chest pain, orthopnea, edema.  GASTROINTESTINAL: positive for nausea, vomiting, diarrhea or abdominal pain.  GENITOURINARY: No dysuria, hematuria.  ENDOCRINE: No polyuria, nocturia,  HEMATOLOGY: No anemia, easy bruising or bleeding SKIN: No rash or lesion. MUSCULOSKELETAL: No joint pain or arthritis.   NEUROLOGIC: No tingling, numbness, weakness.  PSYCHIATRY: No anxiety or depression.   ROS  DRUG ALLERGIES:   Allergies  Allergen Reactions  . Chantix [Varenicline]     Suicidal thoughts  . Morphine And Related Other (See Comments)    Reaction:  Redness of face     VITALS:  Blood pressure 140/65, pulse 77, temperature 98.2 F (36.8 C), temperature source Oral, resp. rate (!) 21, height 5' (1.524 m), weight 70.2 kg (154 lb 11.2 oz), SpO2 97 %.  PHYSICAL EXAMINATION:  GENERAL:  51 y.o.-year-old patient lying in the bed with no acute distress.  EYES: Pupils equal, round, reactive to light and accommodation. No scleral icterus. Extraocular muscles intact.  HEENT: Head atraumatic, normocephalic. Oropharynx and nasopharynx clear.  NECK:  Supple, no jugular venous distention. No thyroid enlargement, no  tenderness.  LUNGS: Normal breath sounds bilaterally, no wheezing, rales,rhonchi or crepitation. No use of accessory muscles of respiration.  CARDIOVASCULAR: S1, S2 normal. No murmurs, rubs, or gallops.  ABDOMEN: Soft, nontender, nondistended. Bowel sounds present. No organomegaly or mass.  EXTREMITIES: No pedal edema, cyanosis, or clubbing.  NEUROLOGIC: Cranial nerves II through XII are intact. Muscle strength 5/5 in all extremities. Sensation intact. Gait not checked.  PSYCHIATRIC: The patient is alert and oriented x 3.  SKIN: No obvious rash, lesion, or ulcer.   Physical Exam LABORATORY PANEL:   CBC  Recent Labs Lab 11/18/16 0528  WBC 16.4*  HGB 15.4  HCT 44.5  PLT 391   ------------------------------------------------------------------------------------------------------------------  Chemistries   Recent Labs Lab 11/17/16 1908 11/18/16 0528  NA 139 136  K 3.6 3.2*  CL 102 104  CO2 27 22  GLUCOSE 127* 140*  BUN 13 9  CREATININE 0.66 0.59  CALCIUM 9.7 9.5  AST 18  --   ALT 14  --   ALKPHOS 98  --   BILITOT 0.5  --    ------------------------------------------------------------------------------------------------------------------  Cardiac Enzymes  Recent Labs Lab 11/17/16 1908  TROPONINI <0.03   ------------------------------------------------------------------------------------------------------------------  RADIOLOGY:  Ct Abdomen Pelvis W Contrast  Result Date: 11/18/2016 CLINICAL DATA:  Acute onset of generalized abdominal pain, nausea and vomiting. EXAM: CT ABDOMEN AND PELVIS WITH CONTRAST TECHNIQUE: Multidetector CT imaging of the abdomen and pelvis was performed using the standard protocol following bolus administration of intravenous contrast. CONTRAST:  100mL ISOVUE-300 IOPAMIDOL (ISOVUE-300) INJECTION 61% COMPARISON:  CT  of the abdomen and pelvis from 08/11/2016 FINDINGS: Lower chest: The visualized lung bases are grossly clear. The visualized  portions of the mediastinum are unremarkable. A tiny hiatal hernia is noted. Hepatobiliary: The liver is unremarkable in appearance. The patient is status post cholecystectomy. The common bile duct remains normal in caliber. Pancreas: The pancreas is within normal limits. Spleen: The spleen is unremarkable in appearance. Adrenals/Urinary Tract: The adrenal glands are unremarkable in appearance. The kidneys are within normal limits. There is no evidence of hydronephrosis. No renal or ureteral stones are identified. No perinephric stranding is seen. Stomach/Bowel: The stomach is unremarkable in appearance. The small bowel is within normal limits. The appendix is normal in caliber, without evidence of appendicitis. The colon is unremarkable in appearance. Mild diffuse fatty infiltration is noted along the wall of the colon, likely reflecting sequelae of chronic inflammation. The colon is otherwise grossly unremarkable. Vascular/Lymphatic: Scattered calcification is seen along the abdominal aorta and its branches. The abdominal aorta is otherwise grossly unremarkable. The inferior vena cava is grossly unremarkable. No retroperitoneal lymphadenopathy is seen. No pelvic sidewall lymphadenopathy is identified. Reproductive: The bladder is mildly distended and within normal limits. The uterus is grossly unremarkable in appearance. The ovaries are relatively symmetric. No suspicious adnexal masses are seen. Other: No additional soft tissue abnormalities are seen. Musculoskeletal: No acute osseous abnormalities are identified. The visualized musculature is unremarkable in appearance. IMPRESSION: 1. No acute abnormality seen to explain the patient's symptoms. 2. Mild diffuse fatty infiltration along the wall of the colon likely reflects sequelae of chronic inflammation. 3. Scattered aortic atherosclerosis. 4. Tiny hiatal hernia noted. Electronically Signed   By: Roanna Raider M.D.   On: 11/18/2016 03:12    ASSESSMENT AND  PLAN:   Active Problems:   Intractable vomiting with nausea  This is a 51 y.o.femalewith a history of asthma, diabetes, hypertension, multiple recent emergency department visits and hospitalizations for similar symptoms including norovirusnow being admitted with:  1. Intractable nausea and vomiting, ?cyclic vomiting ?colitis / gastroparesis  - Checked CT abdomen to rule out colitis given leukocytosis, no acute findings. - Follow up urine drug screen- positive for cannabis -Pain control, antiemetics, IV fluids -Continue Carafate and Protonix, Bentyl, Risaquad - Consider GI consult if not improving. - start on clear liquid diet and check stool studies.  2. History of hypertension, not currently medicated - Monitor - Hydralazine PRN SBP>170  3. H/o Diabetes, not currently medicated - Accuchecks q4h with RISS coverage  4. History of asthma O2 therapy and DuoNeb's as needed    All the records are reviewed and case discussed with Care Management/Social Workerr. Management plans discussed with the patient, family and they are in agreement.  CODE STATUS: full.  TOTAL TIME TAKING CARE OF THIS PATIENT: 35 minutes.   POSSIBLE D/C IN 1-2 DAYS, DEPENDING ON CLINICAL CONDITION.   Altamese Dilling M.D on 11/18/2016   Between 7am to 6pm - Pager - [phone]  After 6pm go to [email or web address] - password [email or web address]  [facility]  Office  [phone]  CC: Primary care physician; Patient, No Pcp Per  Note: This dictation was prepared with Dragon dictation along with smaller phrase technology. Any transcriptional errors that result from this process are unintentional.

## 2016-11-18 NOTE — H&P (Signed)
History and Physical   [facility] - [place] @ [facility] Admission History and Physical [employer], D.O.    Patient Name: Lynn Larson MR#: [id number] Date of Birth: 08/30/1965 Date of Admission: 11/17/2016  Referring MD/NP/PA: Dr. Alphonzo Lemmings Primary Care Physician: Patient, No Pcp Per Patient coming from: Home Outpatient Specialists:  Dr. Tobi Bastos  Chief Complaint:  Chief Complaint  Patient presents with  . Abdominal Pain  . Nausea  . Emesis  . Hypertension    HPI: Lynn Larson is a 51 y.o. female with a known history of asthma, diabetes, hypertension, multiple recent emergency department visits and hospitalizations for similar symptoms including norovirus and colitis presents today for nausea, vomiting and abdominal pain.   She returns today complaining of one day history of severe diffuse abdominal pain which is described as sharp, burning, cramping and associated with nausea, intractable vomiting, fatigue and decreased by mouth intake..She has had multiple emergency department visits and hospitalizations for her pain which has been associated with intractable nausea and vomiting. She used marijuana yesterday but not today. Patient received Haldol, Ativan, Zofran and IVFs in ED.   Patient denies fevers/chills, weakness, dizziness, chest pain, shortness of breath, dysuria/frequency, changes in mental status.   Review of Systems:  CONSTITUTIONAL: No fever/chills, fatigue, weakness, weight gain/loss, headache. EYES: No blurry or double vision. ENT: No tinnitus, postnasal drip, redness or soreness of the oropharynx. RESPIRATORY: No cough, dyspnea, wheeze.  No hemoptysis.  CARDIOVASCULAR: No chest pain, palpitations, syncope, orthopnea. No lower extremity edema.  GASTROINTESTINAL: Positive nausea, vomiting, abdominal pain, diarrhea, constipation.  No hematemesis, melena or hematochezia. GENITOURINARY: No dysuria, frequency, hematuria. ENDOCRINE: No polyuria or nocturia. No heat  or cold intolerance. HEMATOLOGY: No anemia, bruising, bleeding. INTEGUMENTARY: No rashes, ulcers, lesions. MUSCULOSKELETAL: No arthritis, gout, dyspnea. NEUROLOGIC: No numbness, tingling, ataxia, seizure-type activity, weakness. PSYCHIATRIC: No anxiety, depression, insomnia.   Past Medical History:  Diagnosis Date  . Asthma   . Gastric ulcer   . Hypertension     Past Surgical History:  Procedure Laterality Date  . CESAREAN SECTION    . CHOLECYSTECTOMY    . COLONOSCOPY WITH PROPOFOL [name] 08/13/2016   Procedure: COLONOSCOPY WITH PROPOFOL;  Surgeon: Wyline Mood, MD;  [facility] ENDOSCOPY;  Service: Endoscopy;  Laterality: [name];  . ESOPHAGOGASTRODUODENOSCOPY (EGD) WITH PROPOFOL [name] 08/15/2015   Procedure: ESOPHAGOGASTRODUODENOSCOPY (EGD) WITH PROPOFOL;  Surgeon: Christena Deem, MD;  Location: [facility] ENDOSCOPY;  Service: Endoscopy;  Laterality: [name];  . ESOPHAGOGASTRODUODENOSCOPY (EGD) WITH PROPOFOL [name] 08/13/2016   Procedure: ESOPHAGOGASTRODUODENOSCOPY (EGD) WITH PROPOFOL;  Surgeon: Wyline Mood, MD;  Location: [facility] ENDOSCOPY;  Service: Endoscopy;  Laterality: [name];     reports that she has quit smoking. Her smoking use included Cigarettes. She has a 15.00 pack-year smoking history. She has never used smokeless tobacco. She reports that she uses drugs, including Marijuana. She reports that she does not drink alcohol.  Allergies  Allergen Reactions  . Chantix [Varenicline]     Suicidal thoughts  . Morphine And Related Other (See Comments)    Reaction:  Redness of face     Family History  Problem Relation Age of Onset  . Breast cancer Maternal Grandmother   . Ovarian cancer Maternal Grandmother     Prior to Admission medications   Medication Sig Start Date End Date Taking? Authorizing Provider  acetaminophen (TYLENOL) 325 MG tablet Take 2 tablets (650 mg total) by mouth every 6 (six) hours as needed for mild pain (or Fever >/= 101). 07/24/16  Yes Gouru, Aruna,  MD  acidophilus  (RISAQUAD) CAPS capsule Take 2 capsules by mouth 3 (three) times daily. 08/14/16  Yes Wieting, Richard, MD  alum & mag hydroxide-simeth (MAALOX/MYLANTA) 200-200-20 MG/5ML suspension Take 15-30 mLs by mouth 2 (two) times daily. 2 tablespoons in the morning and 1 tablespoon in the evening.   Yes [provider]  dicyclomine (BENTYL) 20 MG tablet Take 1 tablet (20 mg total) by mouth 4 (four) times daily -  before meals and at bedtime. 08/14/16  Yes Wieting, Richard, MD  metoCLOPramide (REGLAN) 5 MG tablet Take 1 tablet (5 mg total) by mouth 3 (three) times daily before meals. 01/20/16  Yes Enid BaasKalisetti, Radhika, MD  ondansetron (ZOFRAN) 4 MG tablet Take 1 tablet (4 mg total) by mouth every 8 (eight) hours as needed for nausea or vomiting. 08/14/16 08/14/17 Yes Wieting, Richard, MD  sucralfate (CARAFATE) 1 g tablet Take 1 tablet (1 g total) by mouth 4 (four) times daily -  with meals and at bedtime. 07/24/16  Yes Ramonita LabGouru, Aruna, MD    Physical Exam: Vitals:   11/17/16 2352 11/17/16 2353 11/18/16 0000 11/18/16 0045  BP: (!) 155/91  (!) 153/87 (!) 177/96  Pulse: 83 80 75 74  Resp:  (!) 27 (!) 28 20  Temp:    99.1 F (37.3 C)  TempSrc:    Axillary  SpO2: 98% 95% 96% 96%  Weight:      Height:       GENERAL: 51 y.o.-year-old White female patient, well-developed, well-nourished lying in the bed in no acute distress.  Pleasant and cooperative.   HEENT: Head atraumatic, normocephalic. Pupils equal, round, reactive to light and accommodation. No scleral icterus. Extraocular muscles intact. Nares are patent. Oropharynx is clear. Mucus membranes moist. NECK: Supple, full range of motion. No JVD, no bruit heard. No thyroid enlargement, no tenderness, no cervical lymphadenopathy. CHEST: Normal breath sounds bilaterally. No wheezing, rales, rhonchi or crackles. No use of accessory muscles of respiration.  No reproducible chest wall tenderness.  CARDIOVASCULAR: S1, S2 normal. No murmurs, rubs, or gallops. Cap  refill <2 seconds. Pulses intact distally.  ABDOMEN: Soft, nondistended, diffuse tenderness to palpation No rebound, guarding, rigidity. Normoactive bowel sounds present in all four quadrants. No organomegaly or mass. EXTREMITIES: No pedal edema, cyanosis, or clubbing. No calf tenderness or Homan's sign.  NEUROLOGIC: The patient is alert and oriented x 3. Cranial nerves II through XII are grossly intact with no focal sensorimotor deficit. Muscle strength 5/5 in all extremities. Sensation intact. Gait not checked. PSYCHIATRIC:  Normal affect, mood, thought content. SKIN: Warm, dry, and intact without obvious rash, lesion, or ulcer.    Labs on Admission:  CBC:  Recent Labs Lab 11/17/16 1908  WBC 18.6*  HGB 15.6  HCT 46.4  MCV 86.0  PLT 422   Basic Metabolic Panel:  Recent Labs Lab 11/17/16 1908  NA 139  K 3.6  CL 102  CO2 27  GLUCOSE 127*  BUN 13  CREATININE 0.66  CALCIUM 9.7   GFR: Estimated Creatinine Clearance: 73.1 mL/min (by C-G formula based on SCr of 0.66 mg/dL). Liver Function Tests:  Recent Labs Lab 11/17/16 1908  AST 18  ALT 14  ALKPHOS 98  BILITOT 0.5  PROT 8.3*  ALBUMIN 4.8    Recent Labs Lab 11/17/16 1908  LIPASE 29   No results for input(s): AMMONIA in the last 168 hours. Coagulation Profile: No results for input(s): INR, PROTIME in the last 168 hours. Cardiac Enzymes:  Recent Labs Lab 11/17/16 1908  TROPONINI <0.03   BNP (last 3 results) No results for input(s): PROBNP in the last 8760 hours. HbA1C: No results for input(s): HGBA1C in the last 72 hours. CBG: No results for input(s): GLUCAP in the last 168 hours. Lipid Profile: No results for input(s): CHOL, HDL, LDLCALC, TRIG, CHOLHDL, LDLDIRECT in the last 72 hours. Thyroid Function Tests: No results for input(s): TSH, T4TOTAL, FREET4, T3FREE, THYROIDAB in the last 72 hours. Anemia Panel: No results for input(s): VITAMINB12, FOLATE, FERRITIN, TIBC, IRON, RETICCTPCT in the last  72 hours. Urine analysis:    Component Value Date/Time   COLORURINE YELLOW (A) 08/11/2016 1740   APPEARANCEUR HAZY (A) 08/11/2016 1740   APPEARANCEUR Clear 10/22/2013 1222   LABSPEC 1.013 08/11/2016 1740   LABSPEC 1.013 10/22/2013 1222   PHURINE 6.0 08/11/2016 1740   GLUCOSEU NEGATIVE 08/11/2016 1740   GLUCOSEU Negative 10/22/2013 1222   HGBUR MODERATE (A) 08/11/2016 1740   BILIRUBINUR NEGATIVE 08/11/2016 1740   BILIRUBINUR Negative 10/22/2013 1222   KETONESUR NEGATIVE 08/11/2016 1740   PROTEINUR 30 (A) 08/11/2016 1740   NITRITE NEGATIVE 08/11/2016 1740   LEUKOCYTESUR NEGATIVE 08/11/2016 1740   LEUKOCYTESUR Negative 10/22/2013 1222   Sepsis Labs: @LABRCNTIP (procalcitonin:4,lacticidven:4) )No results found for this or any previous visit (from the past 240 hour(s)).   Radiological Exams on Admission: No results found.  EKG: Normal sinus rhythm at 76 bpm with normal axis and nonspecific ST-T wave changes.   Assessment/Plan This is a 51 y.o. female with a history of asthma, diabetes, hypertension, multiple recent emergency department visits and hospitalizations for similar symptoms including norovirus now being admitted with:  1. Intractable nausea and vomiting, ?cyclic vomiting ?colitis / gastroparesis -Admit to observation - Check CT abdomen to rule out colitis given leukocytosis - Follow up urine drug screen -Pain control, antiemetics, IV fluids -Continue Carafate and Protonix, Bentyl, Risaquad - Consider GI consult if not improving.  2. History of hypertension, not currently medicated - Monitor - Hydralazine PRN SBP>170  3. H/o Diabetes, not currently medicated - Accuchecks q4h with RISS coverage  4. History of asthma O2 therapy and DuoNeb's as needed  Admission status: Observation IV Fluids: NS Diet/Nutrition: NPO Consults called: None DVT Px: Lovenox, SCDs and early ambulation. Code Status: Full Code  Disposition Plan: To home in 1-2   All the  records are reviewed and case discussed with ED provider. Management plans discussed with the patient and/or family who express understanding and agree with plan of care.  Lynn Larson D.O. on 11/18/2016 at 12:59 AM Between 7am to 6pm - Pager - [phone] After 6pm go to [email or web address] - password [email or web address] [facility] [place] [facility] Office [phone] CC: Primary care physician; Patient, No Pcp Per   11/18/2016, 12:59 AM

## 2016-11-18 NOTE — Plan of Care (Signed)
Problem: Safety: Goal: Ability to remain free from injury will improve Outcome: Progressing Patient remains free of injury  Problem: Pain Managment: Goal: General experience of comfort will improve Outcome: Progressing Patient has had no complains of pain with this nurse, has slept for all of shift, only waking when care given  Problem: Activity: Goal: Risk for activity intolerance will decrease Outcome: Not Progressing Patient refused to work with physical therapy, independent in room and getting up to the bathroom  Problem: Fluid Volume: Goal: Ability to maintain a balanced intake and output will improve Outcome: Progressing Patient has not vomited this shift or complained of nausea.  Continues to have loose stools.  Problem: Nutrition: Goal: Adequate nutrition will be maintained Outcome: Progressing Patient tolerated clear liquid diet, advanced to full liquid per Dr. Elisabeth PigeonVachhani

## 2016-11-18 NOTE — Progress Notes (Addendum)
PT Attempt Note  Patient Details Name: Ander GasterJudy Courtright MRN: [id number] DOB: [birth year]  Attempt Treatment:    Reason Eval/Treat Not Completed: Pain limiting ability to participate. Chart reviewed. Attempted to see patient however she refuses secondary to abdominal pain and nausea. Provided extensive encouragement however pt continues to refuse. Pt requesting pain and nausea meds. PT notified wrong RN of patient's request but this was eventually clarified with correct RN. Will attempt PT evaluation on later date/time as pt is willing to participate.   Sharalyn InkJason D Marielle Mantione PT, DPT   Danylah Holden 11/18/2016, 9:35 AM

## 2016-11-19 LAB — CBC
HCT: 46.4 % (ref 35.0–47.0)
HEMOGLOBIN: 16.2 g/dL — AB (ref 12.0–16.0)
MCH: 29.2 pg (ref 26.0–34.0)
MCHC: 34.8 g/dL (ref 32.0–36.0)
MCV: 84 fL (ref 80.0–100.0)
Platelets: 411 10*3/uL (ref 150–440)
RBC: 5.52 MIL/uL — AB (ref 3.80–5.20)
RDW: 15 % — ABNORMAL HIGH (ref 11.5–14.5)
WBC: 20.1 10*3/uL — ABNORMAL HIGH (ref 3.6–11.0)

## 2016-11-19 LAB — GLUCOSE, CAPILLARY
GLUCOSE-CAPILLARY: 126 mg/dL — AB (ref 65–99)
GLUCOSE-CAPILLARY: 128 mg/dL — AB (ref 65–99)
GLUCOSE-CAPILLARY: 123 mg/dL — AB (ref 65–99)
Glucose-Capillary: 120 mg/dL — ABNORMAL HIGH (ref 65–99)
Glucose-Capillary: 155 mg/dL — ABNORMAL HIGH (ref 65–99)
Glucose-Capillary: 163 mg/dL — ABNORMAL HIGH (ref 65–99)

## 2016-11-19 LAB — BASIC METABOLIC PANEL
ANION GAP: 9 (ref 5–15)
BUN: 5 mg/dL — ABNORMAL LOW (ref 6–20)
CHLORIDE: 102 mmol/L (ref 101–111)
CO2: 22 mmol/L (ref 22–32)
Calcium: 9.7 mg/dL (ref 8.9–10.3)
Creatinine, Ser: 0.54 mg/dL (ref 0.44–1.00)
GFR calc non Af Amer: >60 mL/min (ref >60)
Glucose, Bld: 119 mg/dL — ABNORMAL HIGH (ref 65–99)
Potassium: 3.8 mmol/L (ref 3.5–5.1)
Sodium: 133 mmol/L — ABNORMAL LOW (ref 135–145)

## 2016-11-19 MED ORDER — ONDANSETRON 8 MG PO TBDP
8.0000 mg | ORAL_TABLET | Freq: Three times a day (TID) | ORAL | Status: DC
  Administered 2016-11-19 (×3): 8 mg via ORAL
  Filled 2016-11-19 (×4): qty 1

## 2016-11-19 MED ORDER — HYDRALAZINE HCL 20 MG/ML IJ SOLN
10.0000 mg | INTRAMUSCULAR | Status: DC | PRN
  Administered 2016-11-19 – 2016-11-20 (×2): 10 mg via INTRAVENOUS
  Filled 2016-11-19 (×2): qty 1

## 2016-11-19 MED ORDER — ONDANSETRON 8 MG PO TBDP
8.0000 mg | ORAL_TABLET | Freq: Three times a day (TID) | ORAL | Status: DC | PRN
  Filled 2016-11-19: qty 1

## 2016-11-19 NOTE — Progress Notes (Signed)
[facility] - [place] at [facility]   PATIENT NAME: Lynn Larson    MR#:  [id number]  DATE OF BIRTH:  09-14-1965  SUBJECTIVE:  CHIEF COMPLAINT:   Chief Complaint  Patient presents with  . Abdominal Pain  . Nausea  . Emesis  . Hypertension     Recurrent ER visits for nausea and abdominal pain.   She had EGD and colonoscopy done in feb 2018- showed gastritis and esophagitis.   Claims to be taking sucralfate and protonix daily as advised.   Found to have EPEC- still have nausea with liquids diet.  REVIEW OF SYSTEMS:  CONSTITUTIONAL: No fever, fatigue or weakness.  EYES: No blurred or double vision.  EARS, NOSE, AND THROAT: No tinnitus or ear pain.  RESPIRATORY: No cough, shortness of breath, wheezing or hemoptysis.  CARDIOVASCULAR: No chest pain, orthopnea, edema.  GASTROINTESTINAL: positive for nausea, vomiting, diarrhea or abdominal pain.  GENITOURINARY: No dysuria, hematuria.  ENDOCRINE: No polyuria, nocturia,  HEMATOLOGY: No anemia, easy bruising or bleeding SKIN: No rash or lesion. MUSCULOSKELETAL: No joint pain or arthritis.   NEUROLOGIC: No tingling, numbness, weakness.  PSYCHIATRY: No anxiety or depression.   ROS  DRUG ALLERGIES:   Allergies  Allergen Reactions  . Chantix [Varenicline]     Suicidal thoughts  . Morphine And Related Other (See Comments)    Reaction:  Redness of face     VITALS:  Blood pressure (!) 165/95, pulse 89, temperature 98.9 F (37.2 C), temperature source Oral, resp. rate 19, height 5' (1.524 m), weight 70.2 kg (154 lb 11.2 oz), SpO2 93 %.  PHYSICAL EXAMINATION:  GENERAL:  51 y.o.-year-old patient lying in the bed with no acute distress.  EYES: Pupils equal, round, reactive to light and accommodation. No scleral icterus. Extraocular muscles intact.  HEENT: Head atraumatic, normocephalic. Oropharynx and nasopharynx clear.  NECK:  Supple, no jugular venous distention. No thyroid enlargement, no tenderness.  LUNGS:  Normal breath sounds bilaterally, no wheezing, rales,rhonchi or crepitation. No use of accessory muscles of respiration.  CARDIOVASCULAR: S1, S2 normal. No murmurs, rubs, or gallops.  ABDOMEN: Soft, nontender, nondistended. Bowel sounds present. No organomegaly or mass.  EXTREMITIES: No pedal edema, cyanosis, or clubbing.  NEUROLOGIC: Cranial nerves II through XII are intact. Muscle strength 5/5 in all extremities. Sensation intact. Gait not checked.  PSYCHIATRIC: The patient is alert and oriented x 3.  SKIN: No obvious rash, lesion, or ulcer.   Physical Exam LABORATORY PANEL:   CBC  Recent Labs Lab 11/19/16 0633  WBC 20.1*  HGB 16.2*  HCT 46.4  PLT 411   ------------------------------------------------------------------------------------------------------------------  Chemistries   Recent Labs Lab 11/17/16 1908  11/19/16 0633  NA 139  < > 133*  K 3.6  < > 3.8  CL 102  < > 102  CO2 27  < > 22  GLUCOSE 127*  < > 119*  BUN 13  < > 5*  CREATININE 0.66  < > 0.54  CALCIUM 9.7  < > 9.7  AST 18  --   --   ALT 14  --   --   ALKPHOS 98  --   --   BILITOT 0.5  --   --   < > = values in this interval not displayed. ------------------------------------------------------------------------------------------------------------------  Cardiac Enzymes  Recent Labs Lab 11/17/16 1908  TROPONINI <0.03   ------------------------------------------------------------------------------------------------------------------  RADIOLOGY:  Ct Abdomen Pelvis W Contrast  Result Date: 11/18/2016 CLINICAL DATA:  Acute onset of generalized abdominal pain, nausea and  vomiting. EXAM: CT ABDOMEN AND PELVIS WITH CONTRAST TECHNIQUE: Multidetector CT imaging of the abdomen and pelvis was performed using the standard protocol following bolus administration of intravenous contrast. CONTRAST:  100mL ISOVUE-300 IOPAMIDOL (ISOVUE-300) INJECTION 61% COMPARISON:  CT of the abdomen and pelvis from 08/11/2016  FINDINGS: Lower chest: The visualized lung bases are grossly clear. The visualized portions of the mediastinum are unremarkable. A tiny hiatal hernia is noted. Hepatobiliary: The liver is unremarkable in appearance. The patient is status post cholecystectomy. The common bile duct remains normal in caliber. Pancreas: The pancreas is within normal limits. Spleen: The spleen is unremarkable in appearance. Adrenals/Urinary Tract: The adrenal glands are unremarkable in appearance. The kidneys are within normal limits. There is no evidence of hydronephrosis. No renal or ureteral stones are identified. No perinephric stranding is seen. Stomach/Bowel: The stomach is unremarkable in appearance. The small bowel is within normal limits. The appendix is normal in caliber, without evidence of appendicitis. The colon is unremarkable in appearance. Mild diffuse fatty infiltration is noted along the wall of the colon, likely reflecting sequelae of chronic inflammation. The colon is otherwise grossly unremarkable. Vascular/Lymphatic: Scattered calcification is seen along the abdominal aorta and its branches. The abdominal aorta is otherwise grossly unremarkable. The inferior vena cava is grossly unremarkable. No retroperitoneal lymphadenopathy is seen. No pelvic sidewall lymphadenopathy is identified. Reproductive: The bladder is mildly distended and within normal limits. The uterus is grossly unremarkable in appearance. The ovaries are relatively symmetric. No suspicious adnexal masses are seen. Other: No additional soft tissue abnormalities are seen. Musculoskeletal: No acute osseous abnormalities are identified. The visualized musculature is unremarkable in appearance. IMPRESSION: 1. No acute abnormality seen to explain the patient's symptoms. 2. Mild diffuse fatty infiltration along the wall of the colon likely reflects sequelae of chronic inflammation. 3. Scattered aortic atherosclerosis. 4. Tiny hiatal hernia noted.  Electronically Signed   By: Roanna RaiderJeffery  Chang M.D.   On: 11/18/2016 03:12    ASSESSMENT AND PLAN:   Active Problems:   Intractable vomiting with nausea  This is a 51 y.o.femalewith a history of asthma, diabetes, hypertension, multiple recent emergency department visits and hospitalizations for similar symptoms including norovirusnow being admitted with:  1. Intractable nausea and vomiting, ?cyclic vomiting ?colitis / gastroparesis  - Checked CT abdomen to rule out colitis given leukocytosis, no acute findings. - Follow up urine drug screen- positive for cannabis -Pain control, antiemetics, IV fluids -Continue Carafate and Protonix, Bentyl, Risaquad - start on clear liquid diet and checked stool studies. - EPEC infection. Treat symptomatically. - Still have significant nausea- not comfortable with soft food yet.  2. History of hypertension, not currently medicated - Monitor - Hydralazine PRN SBP>170  3. H/o Diabetes, not currently medicated - Accuchecks q4h with RISS coverage  4. History of asthma O2 therapy and DuoNeb's as needed   All the records are reviewed and case discussed with Care Management/Social Workerr. Management plans discussed with the patient, family and they are in agreement.  CODE STATUS: full.  TOTAL TIME TAKING CARE OF THIS PATIENT: 35 minutes.   POSSIBLE D/C IN 1-2 DAYS, DEPENDING ON CLINICAL CONDITION.   Altamese DillingVACHHANI, Cardelia Sassano M.D on 11/19/2016   Between 7am to 6pm - Pager - [phone]  After 6pm go to [email or web address] - password [email or web address]  [facility] [place] [facility]  Office  [phone]  CC: Primary care physician; Patient, No Pcp Per  Note: This dictation was prepared with Dragon dictation along with smaller phrase technology. Any transcriptional errors that result from  this process are unintentional.

## 2016-11-19 NOTE — Progress Notes (Signed)
Notified Dr. Tobi BastosPyreddy of elevated blood pressure. PRN medication ordered and administered.

## 2016-11-19 NOTE — Progress Notes (Signed)
PT Cancellation Note  Patient Details Name: Lynn Larson MRN: [id number] DOB: February 18, 1966   Cancelled Treatment:    Reason Eval/Treat Not Completed: Other (comment). Chart reviewed. Attempted to see patient. Pt reports that she believes that the evaluation was ordered in error. 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 She has no fall history and states she does not need PT services. Pt requests that PT order be cancelled. Pt was evaluated on 08/12/16 by physical therapy during an admission and no needs were identified. Will complete current order. Please enter new order if status or needs change.   Sharalyn InkJason D Berneda Piccininni PT, DPT   [name] 11/19/2016, 10:31 AM

## 2016-11-20 LAB — GLUCOSE, CAPILLARY
Glucose-Capillary: 142 mg/dL — ABNORMAL HIGH (ref 65–99)
Glucose-Capillary: 120 mg/dL — ABNORMAL HIGH (ref 65–99)
Glucose-Capillary: 126 mg/dL — ABNORMAL HIGH (ref 65–99)

## 2016-11-20 MED ORDER — PANTOPRAZOLE SODIUM 40 MG PO TBEC: 40.0000 mg | DELAYED_RELEASE_TABLET | Freq: Two times a day (BID) | ORAL | 0 refills | Status: AC

## 2016-11-20 MED ORDER — AMLODIPINE BESYLATE 5 MG PO TABS: 5.0000 mg | ORAL_TABLET | Freq: Every day | ORAL | 1 refills | Status: AC

## 2016-11-20 MED ORDER — ONDANSETRON 8 MG PO TBDP: 8.0000 mg | ORAL_TABLET | Freq: Three times a day (TID) | ORAL | 0 refills | Status: AC

## 2016-11-20 MED ORDER — PANTOPRAZOLE SODIUM 40 MG PO TBEC
40.0000 mg | DELAYED_RELEASE_TABLET | Freq: Two times a day (BID) | ORAL | Status: DC
  Administered 2016-11-20: 40 mg via ORAL
  Filled 2016-11-20: qty 1

## 2016-11-20 NOTE — Progress Notes (Signed)
[facility]         [place], [place]   11/20/2016  Patient: Ander GasterJudy Dohse   Date of Birth:  1965-08-05  Date of admission:  11/17/2016  Date of Discharge  11/20/2016    To Whom it May Concern:   Ander GasterJudy Shvartsman  may return to work on 11/23/16.  PHYSICAL ACTIVITY:  Full  If you have any questions or concerns, please don't hesitate to call.  Sincerely,   Altamese DillingVACHHANI, Nakeem Murnane M.D Pager [phone] Office : [phone]   .

## 2016-11-20 NOTE — Discharge Planning (Signed)
Patient IV removed.  Discharge papers given, explained and educated.  RN assessment and VS revealed stability for discharge.  Scripts printed and given.  No follow up appts suggested at this point.  Once ready, will be walked to front and neighbor is transporting home via car.

## 2016-11-20 NOTE — Discharge Summary (Signed)
[facility] - [place] at [facility]   PATIENT NAME: Lynn Larson    MR#:  [id number]  DATE OF BIRTH:  06-09-1966  DATE OF ADMISSION:  11/17/2016 ADMITTING PHYSICIAN: Jon Gills Hugelmeyer, DO  DATE OF DISCHARGE:11/20/2016  PRIMARY CARE PHYSICIAN: Patient, No Pcp Per    ADMISSION DIAGNOSIS:  Intractable vomiting with nausea, unspecified vomiting type [R11.2]  DISCHARGE DIAGNOSIS:  Active Problems:   Intractable vomiting with nausea  EP Ecoli infection   SECONDARY DIAGNOSIS:   Past Medical History:  Diagnosis Date  . Asthma   . Gastric ulcer   . Hypertension     HOSPITAL COURSE:   This is a 51 y.o.femalewith a history of asthma, diabetes, hypertension, multiple recent emergency department visits and hospitalizations for similar symptoms including norovirusnow being admitted with:  1. Intractable nausea and vomiting, ?cyclic vomiting ?colitis / gastroparesis  - Checked CT abdomen to rule out colitis given leukocytosis, no acute findings. - Follow up urine drug screen- positive for cannabis -Pain control, antiemetics, IV fluids -Continue Carafate and Protonix, Bentyl, Risaquad - start on clear liquid diet and checked stool studies. - EPEC infection. Treat symptomatically. - Tolerated food, advised to stay on soft food for next 2-3 days.  2. History of hypertension, not currently medicated - Monitor - Hydralazine PRN SBP>170 - amlodipine prescriptions given on d/c.  3. H/o Diabetes, not currently medicated - Accuchecks q4hwith RISS coverage  4. History of asthma O2 therapy and DuoNeb's as needed   DISCHARGE CONDITIONS:   Stable.  CONSULTS OBTAINED:    DRUG ALLERGIES:   Allergies  Allergen Reactions  . Chantix [Varenicline]     Suicidal thoughts  . Morphine And Related Other (See Comments)    Reaction:  Redness of face     DISCHARGE MEDICATIONS:   Current Discharge Medication List    START taking these medications    Details  amLODipine (NORVASC) 5 MG tablet Take 1 tablet (5 mg total) by mouth daily. Qty: 30 tablet, Refills: 1    ondansetron (ZOFRAN-ODT) 8 MG disintegrating tablet Take 1 tablet (8 mg total) by mouth 3 (three) times daily. Qty: 20 tablet, Refills: 0    pantoprazole (PROTONIX) 40 MG tablet Take 1 tablet (40 mg total) by mouth 2 (two) times daily. Qty: 60 tablet, Refills: 0      CONTINUE these medications which have NOT CHANGED   Details  acetaminophen (TYLENOL) 325 MG tablet Take 2 tablets (650 mg total) by mouth every 6 (six) hours as needed for mild pain (or Fever >/= 101).    acidophilus (RISAQUAD) CAPS capsule Take 2 capsules by mouth 3 (three) times daily. Qty: 180 capsule, Refills: 0    alum & mag hydroxide-simeth (MAALOX/MYLANTA) 200-200-20 MG/5ML suspension Take 15-30 mLs by mouth 2 (two) times daily. 2 tablespoons in the morning and 1 tablespoon in the evening.    dicyclomine (BENTYL) 20 MG tablet Take 1 tablet (20 mg total) by mouth 4 (four) times daily -  before meals and at bedtime. Qty: 120 tablet, Refills: 0    metoCLOPramide (REGLAN) 5 MG tablet Take 1 tablet (5 mg total) by mouth 3 (three) times daily before meals. Qty: 90 tablet, Refills: 0    sucralfate (CARAFATE) 1 g tablet Take 1 tablet (1 g total) by mouth 4 (four) times daily -  with meals and at bedtime. Qty: 120 tablet, Refills: 2      STOP taking these medications     ondansetron (ZOFRAN) 4 MG tablet  DISCHARGE INSTRUCTIONS:    Follow with PMD in 1-2 weeks.  If you experience worsening of your admission symptoms, develop shortness of breath, life threatening emergency, suicidal or homicidal thoughts you must seek medical attention immediately by calling 911 or calling your MD immediately  if symptoms less severe.  You Must read complete instructions/literature along with all the possible adverse reactions/side effects for all the Medicines you take and that have been prescribed to  you. Take any new Medicines after you have completely understood and accept all the possible adverse reactions/side effects.   Please note  You were cared for by a hospitalist during your hospital stay. If you have any questions about your discharge medications or the care you received while you were in the hospital after you are discharged, you can call the unit and asked to speak with the hospitalist on call if the hospitalist that took care of you is not available. Once you are discharged, your primary care physician will handle any further medical issues. Please note that NO REFILLS for any discharge medications will be authorized once you are discharged, as it is imperative that you return to your primary care physician (or establish a relationship with a primary care physician if you do not have one) for your aftercare needs so that they can reassess your need for medications and monitor your lab values.    Today   CHIEF COMPLAINT:   Chief Complaint  Patient presents with  . Abdominal Pain  . Nausea  . Emesis  . Hypertension    HISTORY OF PRESENT ILLNESS:  Poppi Scantling  is a 51 y.o. female with a known history of asthma, diabetes, hypertension, multiple recent emergency department visits and hospitalizations for similar symptoms including norovirusand colitis presents today for nausea, vomiting and abdominal pain.   She returns today complaining of one day history of severe diffuse abdominal pain which is described as sharp, burning, cramping and associated with nausea, intractable vomiting, fatigue and decreased by mouth intake..She has had multiple emergency department visits and hospitalizations for her pain which has been associated with intractable nausea and vomiting. She used marijuana yesterday but not today. Patient received Haldol, Ativan, Zofran and IVFs in ED.   Patient denies fevers/chills, weakness, dizziness, chest pain, shortness of breath, dysuria/frequency, changes in  mental status.    VITAL SIGNS:  Blood pressure (!) 145/80, pulse 89, temperature 98.6 F (37 C), resp. rate 20, height 5' (1.524 m), weight 70.2 kg (154 lb 11.2 oz), SpO2 97 %.  I/O:   Intake/Output Summary (Last 24 hours) at 11/20/16 0828 Last data filed at 11/19/16 1800  Gross per 24 hour  Intake              240 ml  Output                0 ml  Net              240 ml    PHYSICAL EXAMINATION:  GENERAL:  51 y.o.-year-old patient lying in the bed with no acute distress.  EYES: Pupils equal, round, reactive to light and accommodation. No scleral icterus. Extraocular muscles intact.  HEENT: Head atraumatic, normocephalic. Oropharynx and nasopharynx clear.  NECK:  Supple, no jugular venous distention. No thyroid enlargement, no tenderness.  LUNGS: Normal breath sounds bilaterally, no wheezing, rales,rhonchi or crepitation. No use of accessory muscles of respiration.  CARDIOVASCULAR: S1, S2 normal. No murmurs, rubs, or gallops.  ABDOMEN: Soft, non-tender, non-distended. Bowel sounds present.  No organomegaly or mass.  EXTREMITIES: No pedal edema, cyanosis, or clubbing.  NEUROLOGIC: Cranial nerves II through XII are intact. Muscle strength 5/5 in all extremities. Sensation intact. Gait not checked.  PSYCHIATRIC: The patient is alert and oriented x 3.  SKIN: No obvious rash, lesion, or ulcer.   DATA REVIEW:   CBC  Recent Labs Lab 11/19/16 0633  WBC 20.1*  HGB 16.2*  HCT 46.4  PLT 411    Chemistries   Recent Labs Lab 11/17/16 1908  11/19/16 0633  NA 139  < > 133*  K 3.6  < > 3.8  CL 102  < > 102  CO2 27  < > 22  GLUCOSE 127*  < > 119*  BUN 13  < > 5*  CREATININE 0.66  < > 0.54  CALCIUM 9.7  < > 9.7  AST 18  --   --   ALT 14  --   --   ALKPHOS 98  --   --   BILITOT 0.5  --   --   < > = values in this interval not displayed.  Cardiac Enzymes  Recent Labs Lab 11/17/16 1908  TROPONINI <0.03    Microbiology Results  Results for orders placed or performed  during the hospital encounter of 11/17/16  Gastrointestinal Panel by PCR , Stool     Status: Abnormal   Collection Time: 11/18/16  9:25 AM  Result Value Ref Range Status   Campylobacter species NOT DETECTED NOT DETECTED Final   Plesimonas shigelloides NOT DETECTED NOT DETECTED Final   Salmonella species NOT DETECTED NOT DETECTED Final   Yersinia enterocolitica NOT DETECTED NOT DETECTED Final   Vibrio species NOT DETECTED NOT DETECTED Final   Vibrio cholerae NOT DETECTED NOT DETECTED Final   Enteroaggregative E coli (EAEC) NOT DETECTED NOT DETECTED Final   Enteropathogenic E coli (EPEC) DETECTED (A) NOT DETECTED Final    Comment: RESULT CALLED TO, READ BACK BY AND VERIFIED WITH: DANIELLE JACOBS 11/18/16 1202 SGD    Enterotoxigenic E coli (ETEC) NOT DETECTED NOT DETECTED Final   Shiga like toxin producing E coli (STEC) NOT DETECTED NOT DETECTED Final   Shigella/Enteroinvasive E coli (EIEC) NOT DETECTED NOT DETECTED Final   Cryptosporidium NOT DETECTED NOT DETECTED Final   Cyclospora cayetanensis NOT DETECTED NOT DETECTED Final   Entamoeba histolytica NOT DETECTED NOT DETECTED Final   Giardia lamblia NOT DETECTED NOT DETECTED Final   Adenovirus F40/41 NOT DETECTED NOT DETECTED Final   Astrovirus NOT DETECTED NOT DETECTED Final   Norovirus GI/GII NOT DETECTED NOT DETECTED Final   Rotavirus A NOT DETECTED NOT DETECTED Final   Sapovirus (I, II, IV, and V) NOT DETECTED NOT DETECTED Final  C difficile quick scan w PCR reflex     Status: None   Collection Time: 11/18/16  9:25 AM  Result Value Ref Range Status   C Diff antigen NEGATIVE NEGATIVE Final   C Diff toxin NEGATIVE NEGATIVE Final   C Diff interpretation No C. difficile detected.  Final    RADIOLOGY:  No results found.  EKG:   Orders placed or performed during the hospital encounter of 11/17/16  . ED EKG  . ED EKG  . EKG 12-Lead  . EKG 12-Lead  . EKG 12-Lead      Management plans discussed with the patient, family and  they are in agreement.  CODE STATUS:     Code Status Orders        Start  Ordered   11/18/16 0041  Full code  Continuous     11/18/16 0040    Code Status History    Date Active Date Inactive Code Status Order ID Comments User Context   08/12/2016  1:44 AM 08/14/2016  2:25 PM Full Code [id number]  Hugelmeyer, Jon GillsAlexis, DO Inpatient   08/01/2016 12:39 PM 08/02/2016  9:00 PM Full Code [id number]  Enid BaasKalisetti, Radhika, MD Inpatient   07/28/2016  2:28 PM 07/30/2016  8:24 PM Full Code [id number]  Alford HighlandWieting, Richard, MD ED   07/22/2016 11:48 PM 07/24/2016  5:59 PM Full Code [id number]  Oralia ManisWillis, David, MD Inpatient   01/16/2016  9:33 PM 01/20/2016  3:07 PM Full Code [id number]  Houston SirenSainani, Vivek J, MD ED   08/14/2015  5:33 PM 08/17/2015  2:44 PM Full Code [id number]  Adrian SaranMody, Sital, MD Inpatient   02/15/2015  8:31 PM 02/18/2015 10:31 PM Full Code [id number]  Katha HammingKonidena, Snehalatha, MD ED      TOTAL TIME TAKING CARE OF THIS PATIENT: 35 minutes.    Altamese DillingVACHHANI, Izyk Marty M.D on 11/20/2016 at 8:28 AM  Between 7am to 6pm - Pager - [phone]  After 6pm go to [email or web address] - password [email or web address]  [facility] [place] [facility]  Office  [phone]  CC: Primary care physician; Patient, No Pcp Per   Note: This dictation was prepared with Dragon dictation along with smaller phrase technology. Any transcriptional errors that result from this process are unintentional.

## 2018-01-01 IMAGING — CT CT ABD-PELV W/ CM
2 of 5 series · 15 of 46 positions shown, 17 images · IV contrast (APPLIED)
Comparison: CT abdomen dated 07/21/2016 and CT abdomen dated
01/16/2016.

CLINICAL DATA: Generalized abdominal pain.  Persistent vomiting.

EXAM:
CT ABDOMEN AND PELVIS WITH CONTRAST
TECHNIQUE: Multidetector CT imaging of the abdomen and pelvis was performed
using the standard protocol following bolus administration of
intravenous contrast.
CONTRAST:  100mL 2CKSS5-SNN IOPAMIDOL (2CKSS5-SNN) INJECTION 61%

[Series 2: axial st · axial · 0.69mm/px · z∈[-485,-90]mm · 12 of 89 slices shown, 14 images]
[im 5/89  soft-tissue]
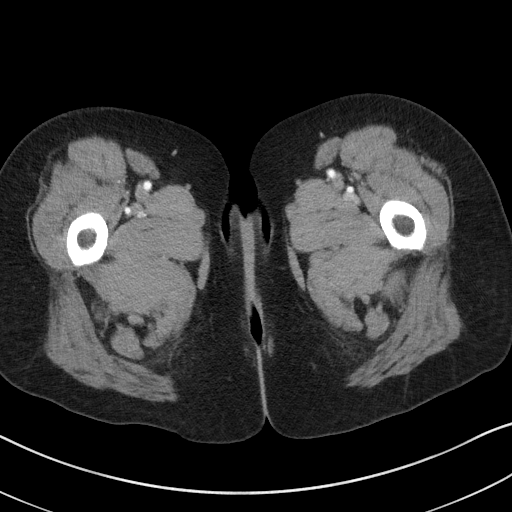
[im 5/89  bone]
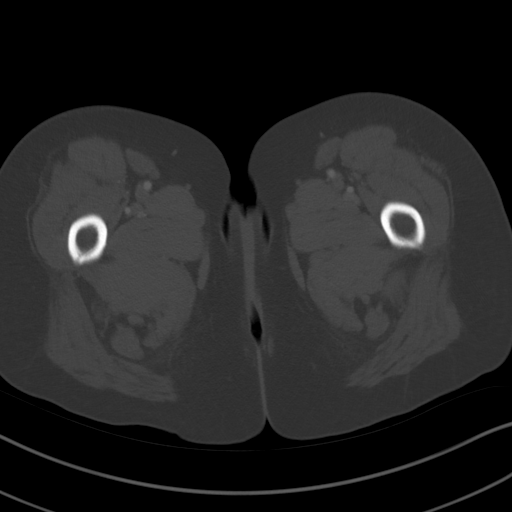
[im 14/89  soft-tissue]
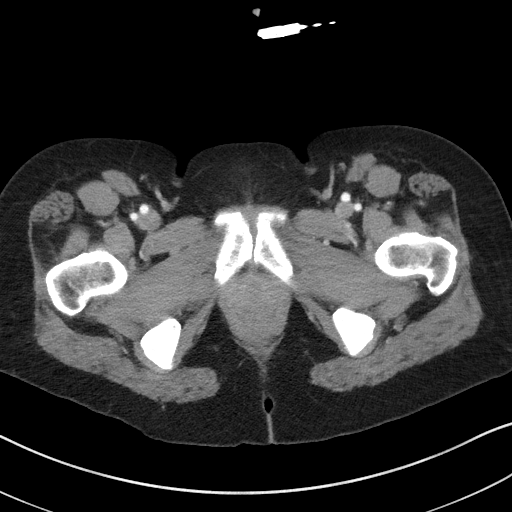
[im 19/89  soft-tissue]
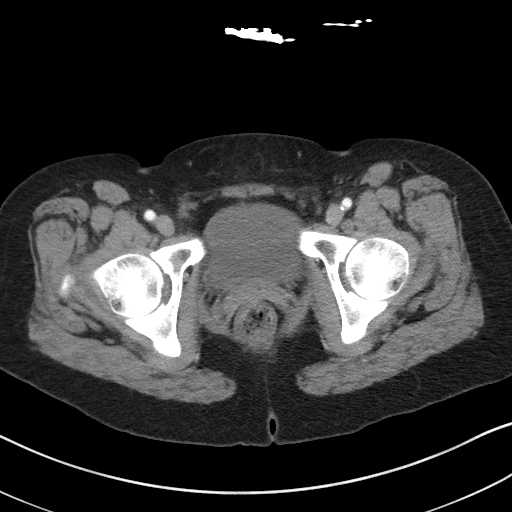
[im 28/89  soft-tissue]
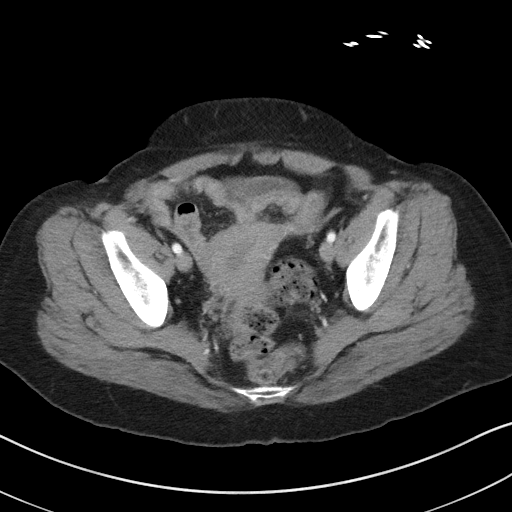
[im 33/89  soft-tissue]
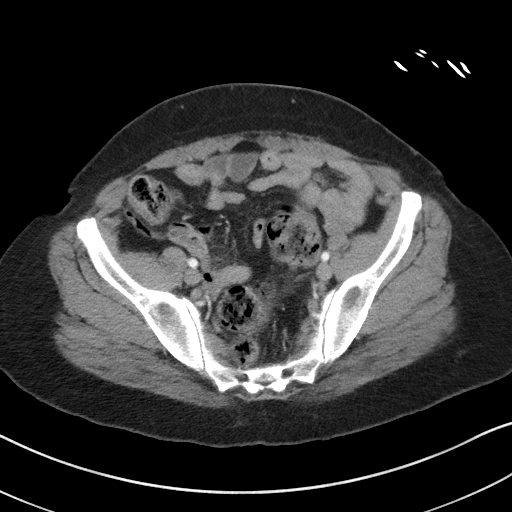
[im 42/89  soft-tissue]
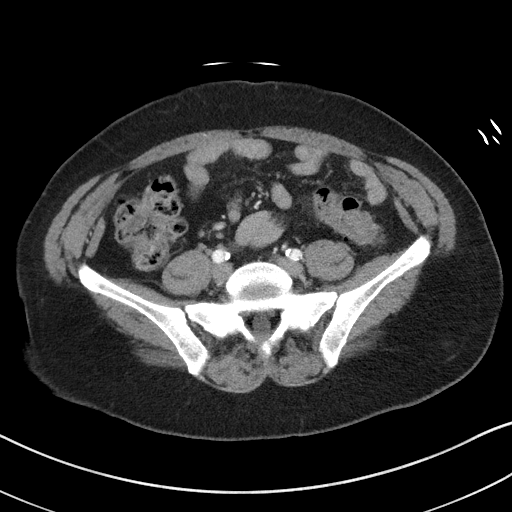
[im 47/89  soft-tissue]
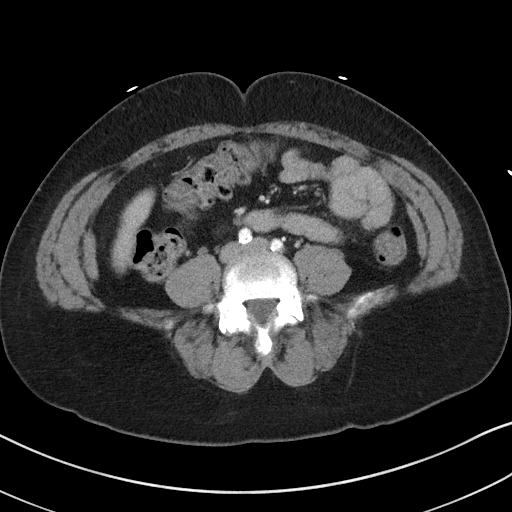
[im 56/89  soft-tissue]
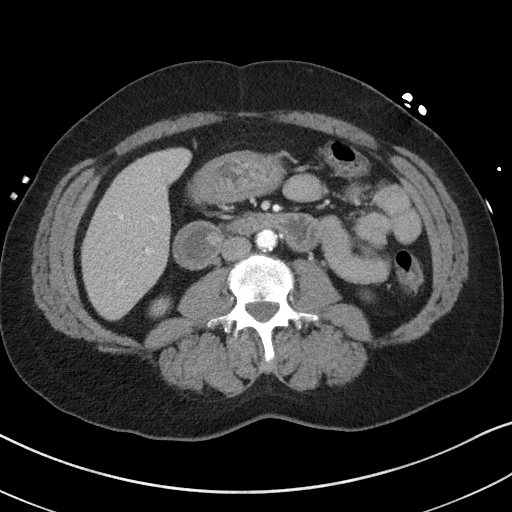
[im 61/89  soft-tissue]
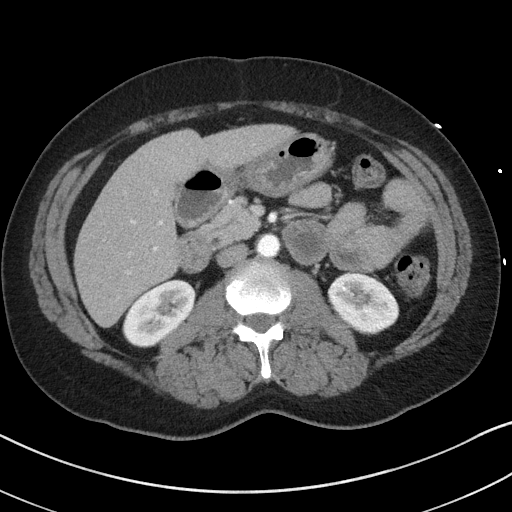
[im 61/89  bone]
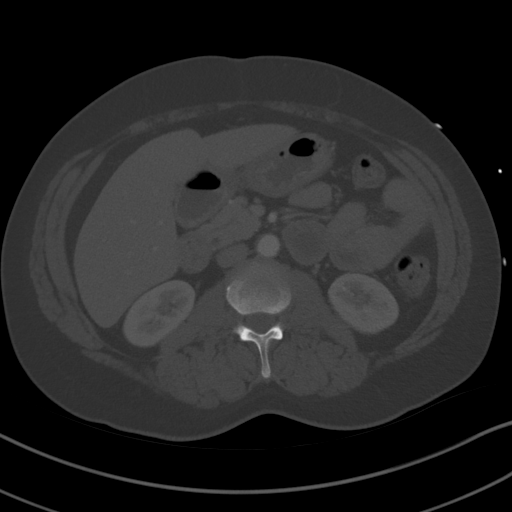
[im 70/89  soft-tissue]
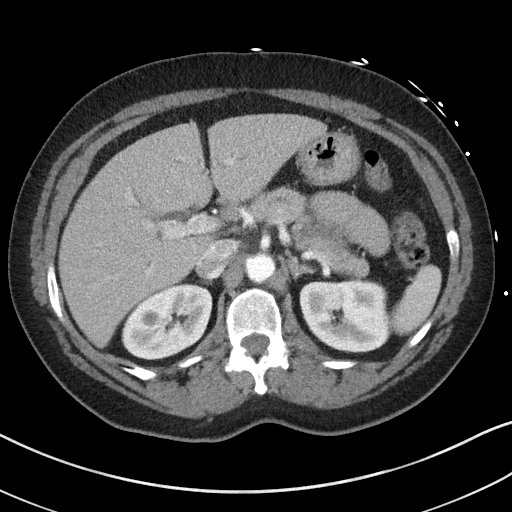
[im 75/89  soft-tissue]
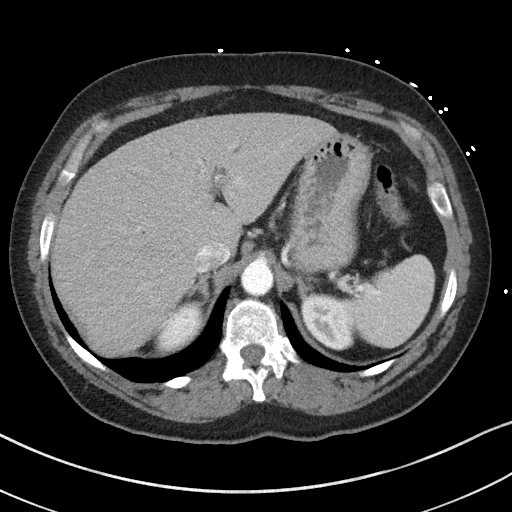
[im 84/89  soft-tissue]
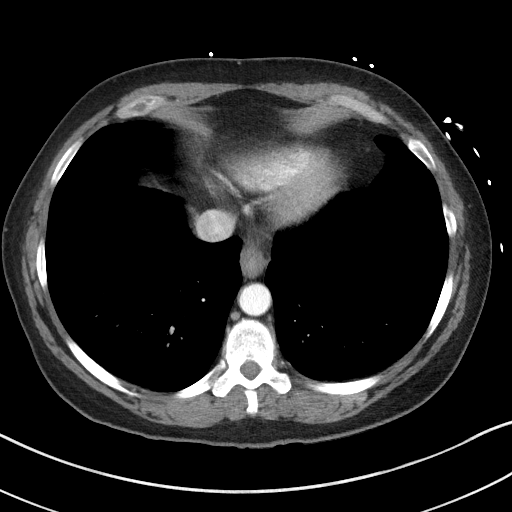

[Series 5: coronal st · coronal · 0.68mm/px · 3 of 98 slices shown]
[im 33/98  soft-tissue]
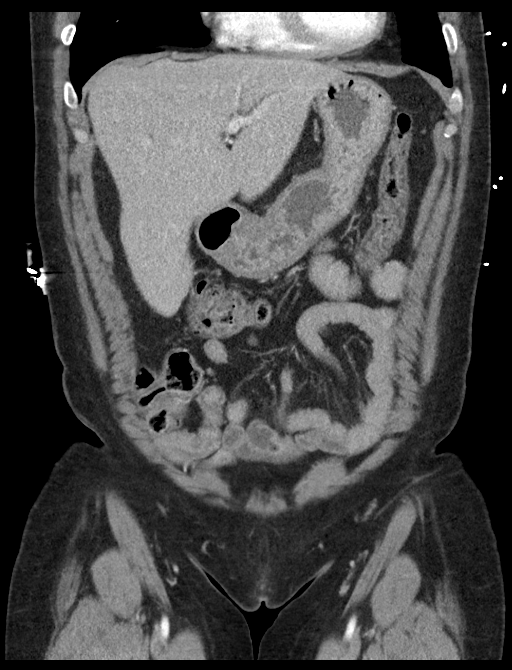
[im 44/98  soft-tissue]
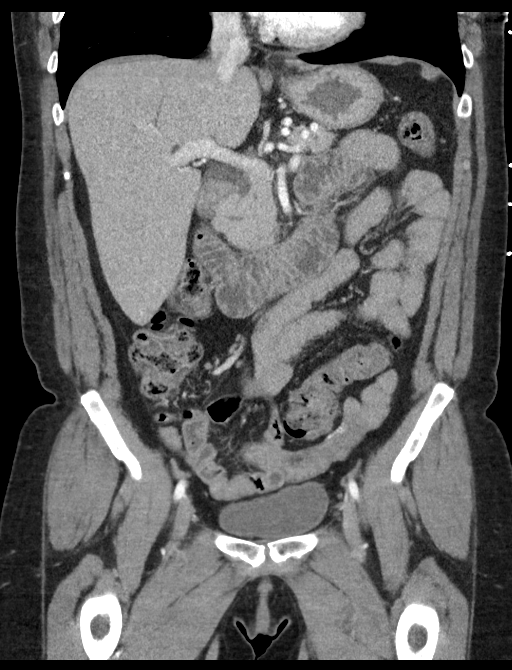
[im 54/98  soft-tissue]
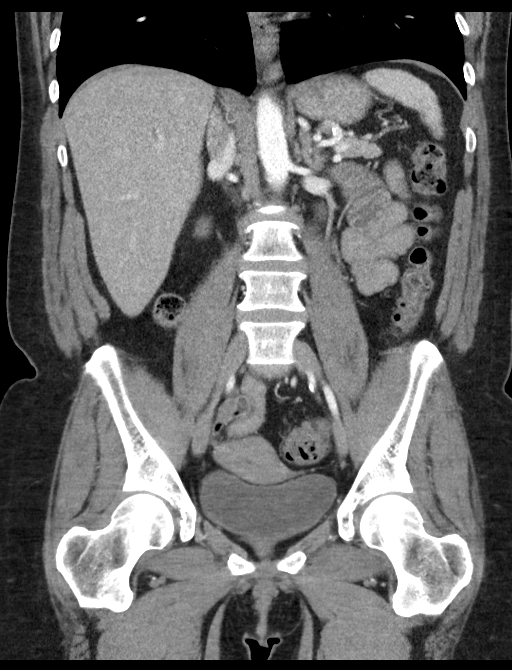

[15 of 46 positions shown; findings below may reference images not displayed]

FINDINGS: Lower chest: No acute abnormality.

Hepatobiliary: Again noted is subtle/mild nodularity of the
peripheral liver contour suggesting cirrhosis. Status post
cholecystectomy with commensurate bile duct dilatation.

Pancreas: Unremarkable. No pancreatic ductal dilatation or
surrounding inflammatory changes.

Spleen: Normal in size without focal abnormality.

Adrenals/Urinary Tract: Adrenal glands appear normal. Kidneys appear
normal without mass, stone or hydronephrosis. No ureteral or bladder
calculi.

Stomach/Bowel: Bowel is normal in caliber. Appendix is normal.
Stomach appears normal. As described on multiple earlier CT reports,
there is mild diffuse thickening of the colonic walls which is not
significantly changed compared to the previous exam dating back to
7932. There is no pericolonic inflammation at this time to suggest
acute colitis. Walls of the small bowel appear normal.

Vascular/Lymphatic: Aortic atherosclerosis. No enlarged abdominal or
pelvic lymph nodes.

Reproductive: Uterus and bilateral adnexa are unremarkable.

Other: No free fluid or abscess collection. No free intraperitoneal
air.

Musculoskeletal: Mild degenerative spurring within the thoracic and
lumbar spine. No acute or suspicious osseous finding. Superficial
soft tissues are unremarkable.
IMPRESSION: 1. No acute findings within the abdomen or pelvis. No bowel
obstruction or evidence of acute bowel wall inflammation. No free
fluid. No renal or ureteral calculi. No evidence of acute solid
organ abnormality. Appendix is normal.
2. Stable mild diffuse thickening of the colonic walls, stable
compared to multiple earlier exams, presumably sequela of previous
infectious or inflammatory colitis. As above, there is no evidence
of acute colitis. No evidence of gastritis or enteritis.
3. Probable early/mild liver cirrhosis.
4. Aortic atherosclerosis.
